# Patient Record
Sex: Female | Born: 1935 | Race: White | Hispanic: No | Marital: Single | Smoking: Never smoker
Health system: Southern US, Community
[De-identification: ages and names within clinical notes are randomized; demographics above are authoritative.]

## PROBLEM LIST (undated history)

## (undated) DIAGNOSIS — J45909 Unspecified asthma, uncomplicated: Secondary | ICD-10-CM

## (undated) DIAGNOSIS — K219 Gastro-esophageal reflux disease without esophagitis: Secondary | ICD-10-CM

## (undated) DIAGNOSIS — H409 Unspecified glaucoma: Secondary | ICD-10-CM

## (undated) DIAGNOSIS — I10 Essential (primary) hypertension: Secondary | ICD-10-CM

## (undated) HISTORY — PX: TOTAL ABDOMINAL HYSTERECTOMY W/ BILATERAL SALPINGOOPHORECTOMY: SHX83

## (undated) HISTORY — PX: HEMORRHOID SURGERY: SHX153

## (undated) HISTORY — PX: APPENDECTOMY: SHX54

---

## 2016-03-03 ENCOUNTER — Encounter (HOSPITAL_COMMUNITY): Payer: Self-pay | Admitting: Internal Medicine

## 2016-03-03 ENCOUNTER — Inpatient Hospital Stay (HOSPITAL_COMMUNITY)
Admission: AD | Admit: 2016-03-03 | Discharge: 2016-03-16 | DRG: 982 | Disposition: A | Discharge status: Skilled Nursing Facility | Payer: Medicare Other | Source: Other Acute Inpatient Hospital | Attending: Internal Medicine | Admitting: Internal Medicine

## 2016-03-03 DIAGNOSIS — IMO0002 Reserved for concepts with insufficient information to code with codable children: Secondary | ICD-10-CM

## 2016-03-03 DIAGNOSIS — Z79899 Other long term (current) drug therapy: Secondary | ICD-10-CM | POA: Diagnosis not present

## 2016-03-03 DIAGNOSIS — Z880 Allergy status to penicillin: Secondary | ICD-10-CM

## 2016-03-03 DIAGNOSIS — N390 Urinary tract infection, site not specified: Secondary | ICD-10-CM | POA: Diagnosis present

## 2016-03-03 DIAGNOSIS — I10 Essential (primary) hypertension: Secondary | ICD-10-CM | POA: Diagnosis present

## 2016-03-03 DIAGNOSIS — L8990 Pressure ulcer of unspecified site, unspecified stage: Secondary | ICD-10-CM | POA: Diagnosis present

## 2016-03-03 DIAGNOSIS — M549 Dorsalgia, unspecified: Secondary | ICD-10-CM | POA: Diagnosis present

## 2016-03-03 DIAGNOSIS — Z79891 Long term (current) use of opiate analgesic: Secondary | ICD-10-CM

## 2016-03-03 DIAGNOSIS — K5900 Constipation, unspecified: Secondary | ICD-10-CM | POA: Diagnosis not present

## 2016-03-03 DIAGNOSIS — M4856XA Collapsed vertebra, not elsewhere classified, lumbar region, initial encounter for fracture: Secondary | ICD-10-CM | POA: Diagnosis present

## 2016-03-03 DIAGNOSIS — K56609 Unspecified intestinal obstruction, unspecified as to partial versus complete obstruction: Secondary | ICD-10-CM

## 2016-03-03 DIAGNOSIS — Z885 Allergy status to narcotic agent status: Secondary | ICD-10-CM | POA: Diagnosis not present

## 2016-03-03 DIAGNOSIS — Z888 Allergy status to other drugs, medicaments and biological substances status: Secondary | ICD-10-CM | POA: Diagnosis not present

## 2016-03-03 DIAGNOSIS — E876 Hypokalemia: Secondary | ICD-10-CM | POA: Diagnosis not present

## 2016-03-03 DIAGNOSIS — M5136 Other intervertebral disc degeneration, lumbar region: Secondary | ICD-10-CM | POA: Diagnosis present

## 2016-03-03 DIAGNOSIS — Z881 Allergy status to other antibiotic agents status: Secondary | ICD-10-CM

## 2016-03-03 DIAGNOSIS — K5641 Fecal impaction: Secondary | ICD-10-CM | POA: Diagnosis present

## 2016-03-03 DIAGNOSIS — R000 Tachycardia, unspecified: Secondary | ICD-10-CM | POA: Diagnosis present

## 2016-03-03 DIAGNOSIS — J45909 Unspecified asthma, uncomplicated: Secondary | ICD-10-CM | POA: Diagnosis present

## 2016-03-03 DIAGNOSIS — H409 Unspecified glaucoma: Secondary | ICD-10-CM | POA: Diagnosis present

## 2016-03-03 DIAGNOSIS — D72829 Elevated white blood cell count, unspecified: Secondary | ICD-10-CM

## 2016-03-03 DIAGNOSIS — Z90710 Acquired absence of both cervix and uterus: Secondary | ICD-10-CM | POA: Diagnosis not present

## 2016-03-03 DIAGNOSIS — K219 Gastro-esophageal reflux disease without esophagitis: Secondary | ICD-10-CM | POA: Diagnosis present

## 2016-03-03 DIAGNOSIS — S32018A Other fracture of first lumbar vertebra, initial encounter for closed fracture: Secondary | ICD-10-CM | POA: Diagnosis not present

## 2016-03-03 HISTORY — DX: Gastro-esophageal reflux disease without esophagitis: K21.9

## 2016-03-03 HISTORY — DX: Unspecified glaucoma: H40.9

## 2016-03-03 HISTORY — DX: Essential (primary) hypertension: I10

## 2016-03-03 HISTORY — DX: Unspecified asthma, uncomplicated: J45.909

## 2016-03-03 MED ORDER — PANTOPRAZOLE SODIUM 40 MG PO TBEC
40.0000 mg | DELAYED_RELEASE_TABLET | Freq: Every day | ORAL | Status: DC
  Administered 2016-03-04 – 2016-03-05 (×2): 40 mg via ORAL
  Filled 2016-03-03 (×2): qty 1

## 2016-03-03 MED ORDER — ONDANSETRON HCL 4 MG/2ML IJ SOLN
4.0000 mg | Freq: Four times a day (QID) | INTRAMUSCULAR | Status: DC | PRN
  Administered 2016-03-03 – 2016-03-15 (×9): 4 mg via INTRAVENOUS
  Filled 2016-03-03 (×9): qty 2

## 2016-03-03 MED ORDER — LEVOFLOXACIN IN D5W 750 MG/150ML IV SOLN
750.0000 mg | INTRAVENOUS | Status: DC
  Administered 2016-03-04 – 2016-03-06 (×3): 750 mg via INTRAVENOUS
  Filled 2016-03-03 (×3): qty 150

## 2016-03-03 MED ORDER — AMLODIPINE BESYLATE 5 MG PO TABS
5.0000 mg | ORAL_TABLET | Freq: Every day | ORAL | Status: DC
  Administered 2016-03-04 – 2016-03-16 (×13): 5 mg via ORAL
  Filled 2016-03-03 (×13): qty 1

## 2016-03-03 MED ORDER — SODIUM CHLORIDE 0.9 % IV SOLN
INTRAVENOUS | Status: DC
  Administered 2016-03-03 – 2016-03-04 (×2): via INTRAVENOUS

## 2016-03-03 MED ORDER — ENOXAPARIN SODIUM 40 MG/0.4ML ~~LOC~~ SOLN
40.0000 mg | Freq: Every day | SUBCUTANEOUS | Status: DC
  Administered 2016-03-03 – 2016-03-11 (×8): 40 mg via SUBCUTANEOUS
  Filled 2016-03-03 (×9): qty 0.4

## 2016-03-03 NOTE — Progress Notes (Signed)
NURSING PROGRESS NOTE  Tara Frederick [id number] Admission Data: 03/03/2016 9:17 PM Attending Provider: Starleen Arms, MD PCP:No primary care provider on file. Code Status: Full  Tara Frederick is a 82 y.o. female patient admitted from ED:  -No acute distress noted.  -No complaints of shortness of breath.  -No complaints of chest pain.   Cardiac Monitoring: None   There were no vitals taken for this visit.   IV Fluids:  IV in place, occlusive dsg intact without redness, IV cath forearm left, condition patent and no redness normal saline.   Allergies:  Review of patient's allergies indicates not on file.  Past Medical History:   has no past medical history on file.  Past Surgical History:   has no past surgical history on file.  Social History:     Skin: redness on bottom. Pink foam in place.  Patient/Family orientated to room. Information packet given to patient/family. Admission inpatient armband information verified with patient/family to include name and date of birth and placed on patient arm. Side rails up x 2, fall assessment and education completed with patient/family. Patient/family able to verbalize understanding of risk associated with falls and verbalized understanding to call for assistance before getting out of bed. Call light within reach. Patient/family able to voice and demonstrate understanding of unit orientation instructions.    Patient arrived to unit at 2100.  Will continue to evaluate and treat per MD orders.

## 2016-03-03 NOTE — H&P (Signed)
History and Physical    Tara Frederick YVO:[id number] DOB: [birth year] DOA: 03/03/2016   PCP: No primary care provider on file. Marita Snellen MD Chief Complaint: Obstipation  HPI: Tara Frederick is a 82 y.o. female with medical history significant of HTN, GERD, degenerative disk disease.  Patient given narcotics in ED last week for back pain.  Since then she has had severe constipation.  Took narcotic last on Sunday.  No BM since then.  Presented to [facility] 3 days ago.  Was admitted.  CT scan at that time showed severe constipation.  Attempts made to treat medically include: 2L golytely, miralax BID, dulcolax 20mg  PO daily, senna, multiple fleet enemas, and 2 attempts at manual disimpaction (no stool in rectum).  All of the above attempts to treat medically have failed, and today patient finally developed nausea and ultimately numerous episodes of vomiting on ambulance ride down here.  Review of Systems: As per HPI otherwise 10 point review of systems negative.    Past Medical History:  Diagnosis Date  . Asthma   . GERD (gastroesophageal reflux disease)   . Glaucoma   . HTN (hypertension)     Past Surgical History:  Procedure Laterality Date  . APPENDECTOMY    . HEMORRHOID SURGERY     x2  . TOTAL ABDOMINAL HYSTERECTOMY W/ BILATERAL SALPINGOOPHORECTOMY       reports that she has never smoked. She has never used smokeless tobacco. She reports that she does not drink alcohol or use drugs.  Allergies not on file  Family History  Problem Relation Age of Onset  . Cancer Sister     Unknown type  . Stomach cancer Brother   . Leukemia Brother       Prior to Admission medications   Not on File    Physical Exam: Vitals:   03/03/16 2048  BP: 136/79  Pulse: (!) 116  Resp: 20  Temp: 97.8 F (36.6 C)  TempSrc: Oral  SpO2: 94%  Weight: 58.9 kg (129 lb 13.6 oz)      Constitutional: NAD, calm, comfortable Eyes: PERRL, lids and conjunctivae normal ENMT: Mucous membranes are  moist. Posterior pharynx clear of any exudate or lesions.Normal dentition.  Neck: normal, supple, no masses, no thyromegaly Respiratory: clear to auscultation bilaterally, no wheezing, no crackles. Normal respiratory effort. No accessory muscle use.  Cardiovascular: Regular rate and rhythm, no murmurs / rubs / gallops. No extremity edema. 2+ pedal pulses. No carotid bruits.  Abdomen: no tenderness, no masses palpated. No hepatosplenomegaly. Bowel sounds positive.  Musculoskeletal: no clubbing / cyanosis. No joint deformity upper and lower extremities. Good ROM, no contractures. Normal muscle tone.  Skin: no rashes, lesions, ulcers. No induration Neurologic: CN 2-12 grossly intact. Sensation intact, DTR normal. Strength 5/5 in all 4.  Psychiatric: Normal judgment and insight. Alert and oriented x 3. Normal mood.    Labs on Admission: I have personally reviewed following labs and imaging studies  CBC: No results for input(s): WBC, NEUTROABS, HGB, HCT, MCV, PLT in the last 168 hours. Basic Metabolic Panel: No results for input(s): NA, K, CL, CO2, GLUCOSE, BUN, CREATININE, CALCIUM, MG, PHOS in the last 168 hours. GFR: CrCl cannot be calculated (Unknown ideal weight.). Liver Function Tests: No results for input(s): AST, ALT, ALKPHOS, BILITOT, PROT, ALBUMIN in the last 168 hours. No results for input(s): LIPASE, AMYLASE in the last 168 hours. No results for input(s): AMMONIA in the last 168 hours. Coagulation Profile: No results for input(s): INR, PROTIME in the  last 168 hours. Cardiac Enzymes: No results for input(s): CKTOTAL, CKMB, CKMBINDEX, TROPONINI in the last 168 hours. BNP (last 3 results) No results for input(s): PROBNP in the last 8760 hours. HbA1C: No results for input(s): HGBA1C in the last 72 hours. CBG: No results for input(s): GLUCAP in the last 168 hours. Lipid Profile: No results for input(s): CHOL, HDL, LDLCALC, TRIG, CHOLHDL, LDLDIRECT in the last 72 hours. Thyroid  Function Tests: No results for input(s): TSH, T4TOTAL, FREET4, T3FREE, THYROIDAB in the last 72 hours. Anemia Panel: No results for input(s): VITAMINB12, FOLATE, FERRITIN, TIBC, IRON, RETICCTPCT in the last 72 hours. Urine analysis: No results found for: COLORURINE, APPEARANCEUR, LABSPEC, PHURINE, GLUCOSEU, HGBUR, BILIRUBINUR, KETONESUR, PROTEINUR, UROBILINOGEN, NITRITE, LEUKOCYTESUR Sepsis Labs: (procalcitonin:4,lacticidven:4) )No results found for this or any previous visit (from the past 240 hour(s)).   Radiological Exams on Admission: No results found.  EKG: Independently reviewed.  Assessment/Plan Principal Problem:   Obstipation Active Problems:   UTI (lower urinary tract infection)    Obstipation - now with N/V  Dr. Bosie Clos was called prior to transfer, GI to see patient tomorrow, probably needs decompressive colonoscopy it sounds like.  Getting gastrocult  zofran PRN  Holding off on further attempts at laxative treatment since this has done nothing thus far and now progressing to N/V  KUB ordered  If N/V continues then will need NGT  UTI - continue levaquin started at [place]   DVT prophylaxis: Lovenox Code Status: Full Family Communication: Family at bedside Consults called: See above Admission status: Admit to inpatient   Hillary Bow DO [facility] Pager [phone] from 7PM-7AM  If 7AM-7PM, please contact the day physician for the patient [email or web address] Password [facility]  03/03/2016, 10:15 PM

## 2016-03-04 ENCOUNTER — Encounter (HOSPITAL_COMMUNITY): Payer: Self-pay | Admitting: Internal Medicine

## 2016-03-04 ENCOUNTER — Inpatient Hospital Stay (HOSPITAL_COMMUNITY): Payer: Medicare Other

## 2016-03-04 DIAGNOSIS — I10 Essential (primary) hypertension: Secondary | ICD-10-CM

## 2016-03-04 DIAGNOSIS — D72829 Elevated white blood cell count, unspecified: Secondary | ICD-10-CM

## 2016-03-04 DIAGNOSIS — K219 Gastro-esophageal reflux disease without esophagitis: Secondary | ICD-10-CM | POA: Diagnosis present

## 2016-03-04 HISTORY — DX: Essential (primary) hypertension: I10

## 2016-03-04 LAB — URINALYSIS, ROUTINE W REFLEX MICROSCOPIC
GLUCOSE, UA: NEGATIVE mg/dL
KETONES UR: 15 mg/dL — AB
NITRITE: NEGATIVE
PROTEIN: 30 mg/dL — AB
Specific Gravity, Urine: 1.022 (ref 1.005–1.030)
pH: 6 (ref 5.0–8.0)

## 2016-03-04 LAB — CBC
HCT: 36.1 % (ref 36.0–46.0)
Hemoglobin: 11.9 g/dL — ABNORMAL LOW (ref 12.0–15.0)
MCH: 28.9 pg (ref 26.0–34.0)
MCHC: 33 g/dL (ref 30.0–36.0)
MCV: 87.6 fL (ref 78.0–100.0)
PLATELETS: 381 10*3/uL (ref 150–400)
RBC: 4.12 MIL/uL (ref 3.87–5.11)
RDW: 12.8 % (ref 11.5–15.5)
WBC: 15.4 10*3/uL — AB (ref 4.0–10.5)

## 2016-03-04 LAB — OCCULT BLOOD GASTRIC / DUODENUM (SPECIMEN CUP): OCCULT BLOOD, GASTRIC: POSITIVE — AB

## 2016-03-04 LAB — BASIC METABOLIC PANEL
ANION GAP: 14 (ref 5–15)
BUN: 22 mg/dL — ABNORMAL HIGH (ref 6–20)
CALCIUM: 10.6 mg/dL — AB (ref 8.9–10.3)
CO2: 22 mmol/L (ref 22–32)
Chloride: 96 mmol/L — ABNORMAL LOW (ref 101–111)
Creatinine, Ser: 0.97 mg/dL (ref 0.44–1.00)
GFR, EST AFRICAN AMERICAN: 58 mL/min — AB (ref >60)
GFR, EST NON AFRICAN AMERICAN: 50 mL/min — AB (ref >60)
GLUCOSE: 104 mg/dL — AB (ref 65–99)
Potassium: 3.3 mmol/L — ABNORMAL LOW (ref 3.5–5.1)
Sodium: 132 mmol/L — ABNORMAL LOW (ref 135–145)

## 2016-03-04 LAB — URINE MICROSCOPIC-ADD ON

## 2016-03-04 LAB — MAGNESIUM: Magnesium: 1.5 mg/dL — ABNORMAL LOW (ref 1.7–2.4)

## 2016-03-04 MED ORDER — LATANOPROST 0.005 % OP SOLN
1.0000 [drp] | Freq: Every day | OPHTHALMIC | Status: DC
  Administered 2016-03-08 – 2016-03-15 (×8): 1 [drp] via OPHTHALMIC
  Filled 2016-03-04: qty 2.5

## 2016-03-04 MED ORDER — SODIUM CHLORIDE 0.9 % IV SOLN
INTRAVENOUS | Status: DC
  Administered 2016-03-04 – 2016-03-07 (×5): via INTRAVENOUS
  Filled 2016-03-04 (×10): qty 1000

## 2016-03-04 MED ORDER — POTASSIUM CHLORIDE 20 MEQ PO PACK
40.0000 meq | PACK | Freq: Once | ORAL | Status: AC
Start: 2016-03-04 — End: 2016-03-04
  Administered 2016-03-04: 40 meq via ORAL
  Filled 2016-03-04: qty 2

## 2016-03-04 MED ORDER — POTASSIUM CHLORIDE 10 MEQ/100ML IV SOLN
INTRAVENOUS | Status: AC
  Filled 2016-03-04: qty 100

## 2016-03-04 MED ORDER — KETOROLAC TROMETHAMINE 15 MG/ML IJ SOLN
15.0000 mg | Freq: Four times a day (QID) | INTRAMUSCULAR | Status: DC | PRN
  Administered 2016-03-05: 15 mg via INTRAVENOUS
  Filled 2016-03-04 (×2): qty 1

## 2016-03-04 MED ORDER — IOPAMIDOL (ISOVUE-300) INJECTION 61%
INTRAVENOUS | Status: AC
  Administered 2016-03-04: 80 mL
  Filled 2016-03-04: qty 100

## 2016-03-04 MED ORDER — SORBITOL 70 % SOLN
960.0000 mL | TOPICAL_OIL | Freq: Once | ORAL | Status: DC
  Filled 2016-03-04: qty 240

## 2016-03-04 MED ORDER — MAGNESIUM SULFATE 4 GM/100ML IV SOLN
4.0000 g | Freq: Once | INTRAVENOUS | Status: AC
  Administered 2016-03-04: 4 g via INTRAVENOUS
  Filled 2016-03-04: qty 100

## 2016-03-04 MED ORDER — PROCHLORPERAZINE EDISYLATE 5 MG/ML IJ SOLN
10.0000 mg | Freq: Four times a day (QID) | INTRAMUSCULAR | Status: DC | PRN
  Administered 2016-03-04: 10 mg via INTRAVENOUS
  Filled 2016-03-04: qty 2

## 2016-03-04 MED ORDER — DIATRIZOATE MEGLUMINE & SODIUM 66-10 % PO SOLN
ORAL | Status: AC
  Filled 2016-03-04: qty 30

## 2016-03-04 MED ORDER — POTASSIUM CHLORIDE 10 MEQ/100ML IV SOLN
10.0000 meq | INTRAVENOUS | Status: AC
  Administered 2016-03-04 (×4): 10 meq via INTRAVENOUS
  Filled 2016-03-04 (×3): qty 100

## 2016-03-04 MED ORDER — POTASSIUM CHLORIDE CRYS ER 20 MEQ PO TBCR: 40.0000 meq | EXTENDED_RELEASE_TABLET | Freq: Once | ORAL | Status: DC

## 2016-03-04 NOTE — Consult Note (Signed)
[facility] Consult Note  Referring Provider: No ref. provider found Primary Care Physician:  No primary care provider on file. Primary Gastroenterologist:  Dr.  Laurel Dimmer Complaint: Obstipation nausea and vomiting HPI: Tara Frederick is an 82 y.o. quite female  who has had no spontaneous bowel movement in 2 weeks after receiving pain medicine for back injury. She became increasingly distended and was admitted to the hospital Monday for attempted clearance of presumed impaction. She was treated with Mira lax, Dulcolax multiple Fleet's enemas and attempted manual disimpaction with no stool in the vault. She began having nausea and vomiting yesterday and was transferred here. We have no x-rays on her but they're ordered. She does have moderate leukocytosis.   Past Medical History:  Diagnosis Date  . Asthma   . GERD (gastroesophageal reflux disease)   . Glaucoma   . HTN (hypertension)     Past Surgical History:  Procedure Laterality Date  . APPENDECTOMY    . HEMORRHOID SURGERY     x2  . TOTAL ABDOMINAL HYSTERECTOMY W/ BILATERAL SALPINGOOPHORECTOMY      Medications Prior to Admission  Medication Sig Dispense Refill  . albuterol (PROVENTIL HFA;VENTOLIN HFA) 108 (90 Base) MCG/ACT inhaler Inhale 1 puff into the lungs every 6 (six) hours as needed for wheezing or shortness of breath.    Marland Kitchen amLODipine (NORVASC) 5 MG tablet Take 5 mg by mouth daily.    . carboxymethylcellulose (REFRESH PLUS) 0.5 % SOLN Place 1 drop into both eyes 3 (three) times daily as needed.    Marland Kitchen denosumab (PROLIA) 60 MG/ML SOLN injection Inject 60 mg into the skin every 6 (six) months. Administer in upper arm, thigh, or abdomen    . ergocalciferol (VITAMIN D2) 50000 units capsule Take 50,000 Units by mouth every 30 (thirty) days.    Marland Kitchen erythromycin ophthalmic ointment Place 1 application into both eyes at bedtime.    . fexofenadine (ALLEGRA) 180 MG tablet Take 180 mg by mouth daily.    Marland Kitchen latanoprost (XALATAN)  0.005 % ophthalmic solution Place 1 drop into the left eye at bedtime.    . Multiple Vitamins-Minerals (MULTIVITAMIN WITH MINERALS) tablet Take 1 tablet by mouth daily.    . pantoprazole (PROTONIX) 40 MG tablet Take 40 mg by mouth 2 (two) times daily.      Allergies:  Allergies  Allergen Reactions  . Toprol Xl [Metoprolol] Shortness Of Breath  . Actonel [Risedronate Sodium]     unk  . Biaxin [Clarithromycin] Nausea And Vomiting  . Ceftin [Cefuroxime]     unk  . Codeine Nausea Only  . Diuretic [Buchu-Cornsilk-Ch Grass-Hydran]     unknown  . Evista [Raloxifene]     unknown  . Fosamax [Alendronate Sodium]     uknown  . Lortab [Hydrocodone-Acetaminophen]     "activie"  . Macrobid [Nitrofurantoin]     unknown  . Morphine And Related Nausea Only  . Neosporin [Neomycin-Bacitracin Zn-Polymyx]     unk  . Penicillins     Unknown  . Prednisone Nausea And Vomiting  . Tetracyclines & Related     unknown  . Tramadol Nausea Only    Family History  Problem Relation Age of Onset  . Cancer Sister     Unknown type  . Stomach cancer Brother   . Leukemia Brother     Social History:  reports that she has never smoked. She has never used smokeless tobacco. She reports that she does not drink alcohol or use drugs.  Review of Systems: negative  except As above   Blood pressure 139/69, pulse (!) 116, temperature 98.7 F (37.1 C), temperature source Oral, resp. rate 17, weight 58.9 kg (129 lb 13.6 oz), SpO2 94 %. Head: Normocephalic, without obvious abnormality, atraumatic Neck: no adenopathy, no carotid bruit, no JVD, supple, symmetrical, trachea midline and thyroid not enlarged, symmetric, no tenderness/mass/nodules Resp: clear to auscultation bilaterally Cardio: regular rate and rhythm, S1, S2 normal, no murmur, click, rub or gallop GI: Moderately distended tympanitic, patient appears moderately uncomfortable. No obvious organomegaly or mass Extremities: extremities normal, atraumatic,  no cyanosis or edema  Results for orders placed or performed during the hospital encounter of 03/03/16 (from the past 48 hour(s))  Urinalysis, Routine w reflex microscopic (not at [facility]     Status: Abnormal   Collection Time: 03/04/16 12:50 AM  Result Value Ref Range   Color, Urine AMBER (A) YELLOW    Comment: BIOCHEMICALS MAY BE AFFECTED BY COLOR   APPearance CLOUDY (A) CLEAR   Specific Gravity, Urine 1.022 1.005 - 1.030   pH 6.0 5.0 - 8.0   Glucose, UA NEGATIVE NEGATIVE mg/dL   Hgb urine dipstick SMALL (A) NEGATIVE   Bilirubin Urine SMALL (A) NEGATIVE   Ketones, ur 15 (A) NEGATIVE mg/dL   Protein, ur 30 (A) NEGATIVE mg/dL   Nitrite NEGATIVE NEGATIVE   Leukocytes, UA MODERATE (A) NEGATIVE  Urine microscopic-add on     Status: Abnormal   Collection Time: 03/04/16 12:50 AM  Result Value Ref Range   Squamous Epithelial / LPF 0-5 (A) NONE SEEN   WBC, UA TOO NUMEROUS TO COUNT 0 - 5 WBC/hpf   RBC / HPF 0-5 0 - 5 RBC/hpf   Bacteria, UA MANY (A) NONE SEEN   Crystals CA OXALATE CRYSTALS (A) NEGATIVE  Occult blood gastric / duodenum     Status: Abnormal   Collection Time: 03/04/16  2:05 AM  Result Value Ref Range   pH, Gastric NOT DONE    Occult Blood, Gastric POSITIVE (A) NEGATIVE  CBC     Status: Abnormal   Collection Time: 03/04/16  5:02 AM  Result Value Ref Range   WBC 15.4 (H) 4.0 - 10.5 K/uL   RBC 4.12 3.87 - 5.11 MIL/uL   Hemoglobin 11.9 (L) 12.0 - 15.0 g/dL   HCT 36.1 36.0 - 46.0 %   MCV 87.6 78.0 - 100.0 fL   MCH 28.9 26.0 - 34.0 pg   MCHC 33.0 30.0 - 36.0 g/dL   RDW 12.8 11.5 - 15.5 %   Platelets 381 150 - 400 K/uL  Basic metabolic panel     Status: Abnormal   Collection Time: 03/04/16  5:02 AM  Result Value Ref Range   Sodium 132 (L) 135 - 145 mmol/L   Potassium 3.3 (L) 3.5 - 5.1 mmol/L   Chloride 96 (L) 101 - 111 mmol/L   CO2 22 22 - 32 mmol/L   Glucose, Bld 104 (H) 65 - 99 mg/dL   BUN 22 (H) 6 - 20 mg/dL   Creatinine, Ser 0.97 0.44 - 1.00 mg/dL   Calcium 10.6  (H) 8.9 - 10.3 mg/dL   GFR calc non Af Amer 50 (L) >60 mL/min   GFR calc Af Amer 58 (L) >60 mL/min    Comment: (NOTE) The eGFR has been calculated using the CKD EPI equation. This calculation has not been validated in all clinical situations. eGFR's persistently <60 mL/min signify possible Chronic Kidney Disease.    Anion gap 14 5 - 15  Magnesium  Status: Abnormal   Collection Time: 03/04/16  5:02 AM  Result Value Ref Range   Magnesium 1.5 (L) 1.7 - 2.4 mg/dL   No results found.  Assessment: Constipation/impaction, exact level unclear. Plan:  Await x-rays. May need NG decompression, possible continued Mira lax by NG tube, possible disimpaction under anesthesia if impaction low enough,. We'll await x-rays and follow with you. Maecie Sevcik C 03/04/2016, 9:59 AM  Pager [phone] If no answer or after 5 PM call [phone]

## 2016-03-04 NOTE — Progress Notes (Signed)
Charge RN and ICU RN attempted x1 to place NG tube and was unsuccessful.

## 2016-03-04 NOTE — Progress Notes (Signed)
PROGRESS NOTE    Tara Frederick  YSA:[id number] DOB: [birth year] DOA: 03/03/2016 PCP: No primary care provider on file.    Brief Narrative:  Tara Frederick is a 82 y.o. female with medical history significant of HTN, GERD, degenerative disk disease.  Patient given narcotics in ED last week for back pain.  Since then she has had severe constipation.  Took narcotic last on "Sunday.  No BM since then.  Presented to [facility] 3 days ago.  Was admitted.  CT scan at that time showed severe constipation.  Attempts made to treat medically include: 2L golytely, miralax BID, dulcolax 20mg PO daily, senna, multiple fleet enemas, and 2 attempts at manual disimpaction (no stool in rectum).  All of the above attempts to treat medically have failed, and today patient finally developed nausea and ultimately numerous episodes of vomiting on ambulance ride down here.  Assessment & Plan:   Principal Problem:   Obstipation Active Problems:   UTI (lower urinary tract infection)   Benign essential HTN   GERD (gastroesophageal reflux disease)  #1 obstipation Patient admitted with concerns for obstipation. Patient's last bowel movement was July 12 of family after receiving narcotic pain medications for back injury. Patient with worsening abdominal distention and inability to have bowel movements the site multiple laxatives and multiple Fleet enemas as well as an attempt at manual disimpaction with no stool noted in the rectal vault. Patient with some nausea today no emesis. Plain films of the abdomen with a markedly dilated loop of colon in the right side of the abdomen measuring approximately 11.7 cm. Increasing risk for perforation. Concern for possible sigmoid volvulus. Will get a CT scan of the abdomen and pelvis for further evaluation. Continue bowel rest. IV fluids. Supportive care. GI has been consulted and following.  #2 probable UTI Check urine cultures. IV Levaquin.  #3 leukocytosis Likely secondary to problem  #2.  #4 gastroesophageal reflux disease PPI.  #5 hypertension Continue Norvasc.   DVT prophylaxis: Lovenox Code Status: Full Family Communication: Updated patient, and daughters and brother at bedside. Disposition Plan: Home once acute medical issues have resolved and patient is stable and tolerating oral intake with good bowel movements.   Consultants:   Gastroenterology: Dr. Hayes 03/04/2016  Procedures:   CXR 03/04/2016  KUB 03/04/2016  Antimicrobials:   IV Levaquin 03/04/2016   Subjective: Patient c/o nausea, abdominal pain. Last BM 02/16/2016. No SOB. No CP.  Objective: Vitals:   03/03/16 2048 03/04/16 0513  BP: 136/79 139/69  Pulse: (!) 116 (!) 116  Resp: 20 17  Temp: 97.8 F (36.6 C) 98.7 F (37.1 C)  TempSrc: Oral Oral  SpO2: 94% 94%  Weight: 58.9 kg (129 lb 13.6 oz)     Intake/Output Summary (Last 24 hours) at 03/04/16 1252 Last data filed at 03/04/16 0949  Gross per 24 hour  Intake           993.75 ml  Output              400 ml  Net           59" 3.75 ml   Filed Weights   03/03/16 2048  Weight: 58.9 kg (129 lb 13.6 oz)    Examination:  General exam: Appears calm and comfortable  Respiratory system: Clear to auscultation anterior lung fields. Cardiovascular system: S1 & S2 heard, RRR. No JVD, murmurs, rubs, gallops or clicks. No pedal edema. Gastrointestinal system: Abdomen is distended, diffusely TTP greater RLQ. Mildly tight. No organomegaly or masses felt.  Normal bowel sounds heard. Central nervous system: Alert and oriented. No focal neurological deficits. Extremities: Symmetric 5 x 5 power. Skin: No rashes, lesions or ulcers Psychiatry: Judgement and insight appear normal. Mood & affect appropriate.     Data Reviewed: I have personally reviewed following labs and imaging studies  CBC:  Recent Labs Lab 03/04/16 0502  WBC 15.4*  HGB 11.9*  HCT 36.1  MCV 87.6  PLT 381   Basic Metabolic Panel:  Recent Labs Lab 03/04/16 0502   NA 132*  K 3.3*  CL 96*  CO2 22  GLUCOSE 104*  BUN 22*  CREATININE 0.97  CALCIUM 10.6*  MG 1.5*   GFR: CrCl cannot be calculated (Unknown ideal weight.). Liver Function Tests: No results for input(s): AST, ALT, ALKPHOS, BILITOT, PROT, ALBUMIN in the last 168 hours. No results for input(s): LIPASE, AMYLASE in the last 168 hours. No results for input(s): AMMONIA in the last 168 hours. Coagulation Profile: No results for input(s): INR, PROTIME in the last 168 hours. Cardiac Enzymes: No results for input(s): CKTOTAL, CKMB, CKMBINDEX, TROPONINI in the last 168 hours. BNP (last 3 results) No results for input(s): PROBNP in the last 8760 hours. HbA1C: No results for input(s): HGBA1C in the last 72 hours. CBG: No results for input(s): GLUCAP in the last 168 hours. Lipid Profile: No results for input(s): CHOL, HDL, LDLCALC, TRIG, CHOLHDL, LDLDIRECT in the last 72 hours. Thyroid Function Tests: No results for input(s): TSH, T4TOTAL, FREET4, T3FREE, THYROIDAB in the last 72 hours. Anemia Panel: No results for input(s): VITAMINB12, FOLATE, FERRITIN, TIBC, IRON, RETICCTPCT in the last 72 hours. Sepsis Labs: No results for input(s): PROCALCITON, LATICACIDVEN in the last 168 hours.  No results found for this or any previous visit (from the past 240 hour(s)).       Radiology Studies: Dg Chest 2 View  Result Date: 03/04/2016 CLINICAL DATA:  Leukocytosis. EXAM: CHEST  2 VIEW COMPARISON:  None. FINDINGS: Small bilateral effusions. The heart, hila, an mediastinum are unremarkable. No pulmonary nodules or masses. No focal infiltrates. Mild pleural thickening laterally in the right chest, likely chronic. No other acute abnormalities. IMPRESSION: Small effusions and underlying atelectasis. Electronically Signed   By: Gerome Sam III M.D   On: 03/04/2016 11:29  Dg Abd 2 Views  Result Date: 03/04/2016 CLINICAL DATA:  Leukocytosis and constipation EXAM: ABDOMEN - 2 VIEW COMPARISON:  None.  FINDINGS: No free air, portal venous gas, or pneumatosis. Small effusions in the lung bases. Markedly dilated colon primarily in the right lower abdomen with air-fluid levels on the upright view. The most dilated loop measures up to 11.7 cm increasing the risk of perforation. The more distal loops of colon are relatively decompressed but air does extend into the rectum. No pneumatosis. No other acute abnormalities. IMPRESSION: There is a markedly dilated loop of colon in the right side of the abdomen. A sigmoid volvulus is not excluded. Recommend a CT scan for further evaluation. Findings being called to Dr. Maisie Fus. Electronically Signed   By: Gerome Sam III M.D   On: 03/04/2016 11:39       Scheduled Meds: . amLODipine  5 mg Oral Daily  . enoxaparin (LOVENOX) injection  40 mg Subcutaneous QHS  . levofloxacin (LEVAQUIN) IV  750 mg Intravenous Q24H  . magnesium sulfate 1 - 4 g bolus IVPB  4 g Intravenous Once  . pantoprazole  40 mg Oral Daily   Continuous Infusions: . sodium chloride 100 mL/hr at 03/04/16 1610  LOS: 1 day    Time spent: 35 mins    Mekaela Azizi, MD [facility] Pager [phone]  If 7PM-7AM, please contact night-coverage [email or web address] Password [facility] 03/04/2016, 12:52 PM

## 2016-03-04 NOTE — Progress Notes (Signed)
Upon pt assessment RN noticed Ng tubing was wrapped around in pt mouth. Charge RN called and assisted RN with attempting to place another one. Attempt was unsuccessful. Called Rapid response to see if able to attempt to place another. Schorr, NP paged and notified. Will continue to monitor pt.

## 2016-03-04 NOTE — Progress Notes (Signed)
On call Schorr, NP called back and stated ok to leave NG tube out for tonight d/t multiple unsuccessful attempts will inform day shift MD, and to monitor pt for any distress, nausea, and vomiting. If so will call back.

## 2016-03-04 NOTE — Progress Notes (Addendum)
Pt given 2L tap water enema. Will monitor for bowel movement and will repeat in 4 hours per order.

## 2016-03-04 NOTE — Consult Note (Signed)
Reason for Consult: severe constipation Referring Physician: Dr Alycia Rossetti  Tara Frederick is an 82 y.o. female.  HPI:  82 y.o. female with medical history significant of HTN, GERD, degenerative disk disease.  Patient given narcotics in ED last week for back pain.  Since then she has had severe constipation.  Took narcotic last on Sunday.  No BM since then.  Presented to [facility] 4 days ago.  Was admitted.  CT scan at that time showed severe constipation.  Attempts made to treat medically include: 2L golytely, miralax BID, dulcolax 74m PO daily, senna, multiple fleet enemas, and 2 attempts at manual disimpaction (no stool in rectum).  All of the above attempts to treat medically have failed, and yesterday patient finally developed nausea and ultimately numerous episodes of vomiting on ambulance ride down here.  Plain films here demonstrate distended cecum. CT scan demonstrates proximal colon dilated and full of stool. There is a question transition in the mid descending colon.  She reportedly has a history of diverticulitis.  Past Medical History:  Diagnosis Date  . Asthma   . Benign essential HTN 03/04/2016  . GERD (gastroesophageal reflux disease)   . Glaucoma   . HTN (hypertension)     Past Surgical History:  Procedure Laterality Date  . APPENDECTOMY    . HEMORRHOID SURGERY     x2  . TOTAL ABDOMINAL HYSTERECTOMY W/ BILATERAL SALPINGOOPHORECTOMY      Family History  Problem Relation Age of Onset  . Cancer Sister     Unknown type  . Stomach cancer Brother   . Leukemia Brother     Social History:  reports that she has never smoked. She has never used smokeless tobacco. She reports that she does not drink alcohol or use drugs.  Allergies:  Allergies  Allergen Reactions  . Toprol Xl [Metoprolol] Shortness Of Breath  . Actonel [Risedronate Sodium]     unk  . Biaxin [Clarithromycin] Nausea And Vomiting  . Ceftin [Cefuroxime]     unk  . Codeine Nausea Only  . Diuretic  [Buchu-Cornsilk-Ch Grass-Hydran]     unknown  . Evista [Raloxifene]     unknown  . Fosamax [Alendronate Sodium]     uknown  . Lortab [Hydrocodone-Acetaminophen]     "activie"  . Macrobid [Nitrofurantoin]     unknown  . Morphine And Related Nausea Only  . Neosporin [Neomycin-Bacitracin Zn-Polymyx]     unk  . Penicillins     Unknown  . Prednisone Nausea And Vomiting  . Tetracyclines & Related     unknown  . Tramadol Nausea Only    Medications: I have reviewed the patient's current medications.  Results for orders placed or performed during the hospital encounter of 03/03/16 (from the past 48 hour(s))  Urinalysis, Routine w reflex microscopic ([facility]     Status: Abnormal   Collection Time: 03/04/16 12:50 AM  Result Value Ref Range   Color, Urine AMBER (A) YELLOW    Comment: BIOCHEMICALS MAY BE AFFECTED BY COLOR   APPearance CLOUDY (A) CLEAR   Specific Gravity, Urine 1.022 1.005 - 1.030   pH 6.0 5.0 - 8.0   Glucose, UA NEGATIVE NEGATIVE mg/dL   Hgb urine dipstick SMALL (A) NEGATIVE   Bilirubin Urine SMALL (A) NEGATIVE   Ketones, ur 15 (A) NEGATIVE mg/dL   Protein, ur 30 (A) NEGATIVE mg/dL   Nitrite NEGATIVE NEGATIVE   Leukocytes, UA MODERATE (A) NEGATIVE  Urine microscopic-add on     Status: Abnormal  Collection Time: 03/04/16 12:50 AM  Result Value Ref Range   Squamous Epithelial / LPF 0-5 (A) NONE SEEN   WBC, UA TOO NUMEROUS TO COUNT 0 - 5 WBC/hpf   RBC / HPF 0-5 0 - 5 RBC/hpf   Bacteria, UA MANY (A) NONE SEEN   Crystals CA OXALATE CRYSTALS (A) NEGATIVE  Occult blood gastric / duodenum     Status: Abnormal   Collection Time: 03/04/16  2:05 AM  Result Value Ref Range   pH, Gastric NOT DONE    Occult Blood, Gastric POSITIVE (A) NEGATIVE  CBC     Status: Abnormal   Collection Time: 03/04/16  5:02 AM  Result Value Ref Range   WBC 15.4 (H) 4.0 - 10.5 K/uL   RBC 4.12 3.87 - 5.11 MIL/uL   Hemoglobin 11.9 (L) 12.0 - 15.0 g/dL   HCT 36.1 36.0 - 46.0 %   MCV 87.6  78.0 - 100.0 fL   MCH 28.9 26.0 - 34.0 pg   MCHC 33.0 30.0 - 36.0 g/dL   RDW 12.8 11.5 - 15.5 %   Platelets 381 150 - 400 K/uL  Basic metabolic panel     Status: Abnormal   Collection Time: 03/04/16  5:02 AM  Result Value Ref Range   Sodium 132 (L) 135 - 145 mmol/L   Potassium 3.3 (L) 3.5 - 5.1 mmol/L   Chloride 96 (L) 101 - 111 mmol/L   CO2 22 22 - 32 mmol/L   Glucose, Bld 104 (H) 65 - 99 mg/dL   BUN 22 (H) 6 - 20 mg/dL   Creatinine, Ser 0.97 0.44 - 1.00 mg/dL   Calcium 10.6 (H) 8.9 - 10.3 mg/dL   GFR calc non Af Amer 50 (L) >60 mL/min   GFR calc Af Amer 58 (L) >60 mL/min    Comment: (NOTE) The eGFR has been calculated using the CKD EPI equation. This calculation has not been validated in all clinical situations. eGFR's persistently <60 mL/min signify possible Chronic Kidney Disease.    Anion gap 14 5 - 15  Magnesium     Status: Abnormal   Collection Time: 03/04/16  5:02 AM  Result Value Ref Range   Magnesium 1.5 (L) 1.7 - 2.4 mg/dL    Dg Chest 2 View  Result Date: 03/04/2016 CLINICAL DATA:  Leukocytosis. EXAM: CHEST  2 VIEW COMPARISON:  None. FINDINGS: Small bilateral effusions. The heart, hila, an mediastinum are unremarkable. No pulmonary nodules or masses. No focal infiltrates. Mild pleural thickening laterally in the right chest, likely chronic. No other acute abnormalities. IMPRESSION: Small effusions and underlying atelectasis. Electronically Signed   By: Dorise Bullion III M.D   On: 03/04/2016 11:29  Ct Abdomen Pelvis W Contrast  Result Date: 03/04/2016 CLINICAL DATA:  82 year old female with abdominal pain and distention and severe constipation. EXAM: CT ABDOMEN AND PELVIS WITH CONTRAST TECHNIQUE: Multidetector CT imaging of the abdomen and pelvis was performed using the standard protocol following bolus administration of intravenous contrast. CONTRAST:  11m ISOVUE-300 IOPAMIDOL (ISOVUE-300) INJECTION 61% COMPARISON:  03/04/2016 FINDINGS: Lower chest: Mild  cardiomegaly noted. A moderate hiatal hernia is noted. There is filling defect within this hernia which could represent food but difficult to exclude a mass. Very small bilateral pleural effusions and mild bibasilar atelectasis noted. Hepatobiliary: The liver and gallbladder are unremarkable. There is no evidence of biliary dilatation. Pancreas: Atrophic without other significant abnormality. Spleen: Unremarkable Adrenals/Urinary Tract: Bilateral renal atrophy identified. There is no evidence of hydronephrosis. The adrenal glands and  bladder are unremarkable. Stomach/Bowel: A very large amount of stool is noted within the cecum, ascending colon, transverse colon and proximal descending colon. Gas and fluid within the cecum is also identified which measures up to 9.5 cm in greatest diameter. There is a relative transition point in the mid descending colon without definite colonic abnormality. No wall thickening is identified. There is no evidence of small bowel dilatation. Vascular/Lymphatic: Aortic atherosclerotic calcifications noted without aneurysm. No enlarged lymph nodes are identified. Reproductive: The patient is status post hysterectomy. No adnexal masses identified. Other: No ascites, pneumoperitoneum or abscess. Musculoskeletal: Degenerative changes within the lumbar spine identified. 25% superior endplate compression fracture of L1 is age indeterminate. Mild bony retropulsion noted. IMPRESSION: Distended cecum, ascending colon, transverse colon and proximal descending colon filled with large amount of stool. Relative transition point in the mid descending colon without definite colonic abnormality. Consider direct inspection. No evidence of bowel wall thickening, small bowel obstruction or pneumoperitoneum. Moderate hiatal hernia with filling defect, which could represent feet but a mass is not entirely excluded. Consider direct inspection as indicated. 25% superior endplate compression fracture of L1-  indeterminate. Very small bilateral pleural effusions with bibasilar atelectasis. Cardiomegaly. Electronically Signed   By: Margarette Canada M.D.   On: 03/04/2016 16:36  Dg Abd 2 Views  Result Date: 03/04/2016 CLINICAL DATA:  Leukocytosis and constipation EXAM: ABDOMEN - 2 VIEW COMPARISON:  None. FINDINGS: No free air, portal venous gas, or pneumatosis. Small effusions in the lung bases. Markedly dilated colon primarily in the right lower abdomen with air-fluid levels on the upright view. The most dilated loop measures up to 11.7 cm increasing the risk of perforation. The more distal loops of colon are relatively decompressed but air does extend into the rectum. No pneumatosis. No other acute abnormalities. IMPRESSION: There is a markedly dilated loop of colon in the right side of the abdomen. A sigmoid volvulus is not excluded. Recommend a CT scan for further evaluation. Findings being called to Dr. Marcello Moores. Electronically Signed   By: Dorise Bullion III M.D   On: 03/04/2016 11:39   Review of Systems  Constitutional: Positive for malaise/fatigue.  Gastrointestinal: Positive for abdominal pain, nausea and vomiting.  Musculoskeletal: Positive for back pain.  All other systems reviewed and are negative.  Blood pressure (!) 143/60, pulse (!) 107, temperature 98.2 F (36.8 C), temperature source Oral, resp. rate 20, weight 58.9 kg (129 lb 13.6 oz), SpO2 96 %. Physical Exam  Vitals reviewed. Constitutional: She is oriented to person, place, and time. She appears well-developed and well-nourished. No distress.  Elderly female. Nontoxic, resting comfortably  HENT:  Head: Normocephalic and atraumatic.  Right Ear: External ear normal.  Left Ear: External ear normal.  Eyes: Conjunctivae are normal. No scleral icterus.  Neck: Normal range of motion. Neck supple. No tracheal deviation present.  Cardiovascular: Normal rate and normal heart sounds.   Respiratory: Effort normal and breath sounds normal. No  stridor. No respiratory distress. She has no wheezes.  GI: Soft. She exhibits distension. There is tenderness in the right lower quadrant. There is no rebound and no guarding.  Some abd distension, mainly TTP in RLQ. Hypobs. Old scars. No rebound/guarding/peritonitis.   Musculoskeletal: She exhibits no edema or tenderness.  Neurological: She is alert and oriented to person, place, and time. She exhibits normal muscle tone.  Skin: Skin is warm and dry. No rash noted. She is not diaphoretic. No erythema. No pallor.  Psychiatric: She has a normal mood and affect.  Her behavior is normal. Judgment and thought content normal.    Assessment/Plan: Obstipation/constipation UTI HTN  There is no sign of bowel compromise on her CT. There is no portal venous gas, pneumatosis, or bowel wall thickening. I would repeat her labs in the morning.  To me there appears to be a possible area transition in the descending colon. I agree that the patient needs endoscopic evaluation to rule out an obstructing lesion such as diverticular stricture, etc. to account for her proximal colonic distention and impaction.  For now I agree with tap water enemas, maintain nothing by mouth  We will be available if needed. But recommend GI perform endoscopic evaluation at least to the level of the mid descending colon to rule out stricture or mass  Follow up UTI  Leighton Ruff. Redmond Pulling, MD, FACS General, Bariatric, & Minimally Invasive Surgery [facility], [place]   [facility] M 03/04/2016, 7:41 PM

## 2016-03-04 NOTE — Progress Notes (Signed)
[facility] PROGRESS NOTE Subjective KUB showed massively dilated cecum approximately 11 cm. CT scan was ordered. Reviewed with Dr. Si Gaul.: This essentially pack with stool right colon and transverse colon which is quite long and loops down in the pelvis. Solid stool up until the proximal descending: and then decompressed after that. No clear masses diverticulitis etc. per family patient had aggressive enema therapy in [place]. The patient's family is in the room and reports that she has had about fracture and in bed ridden with narcotics for back pain. There's been no discernible bowel movement for 12 days. Objective: Vital signs in last 24 hours: Temp:  [97.8 F (36.6 C)-98.7 F (37.1 C)] 98.2 F (36.8 C) (07/29 1538) Pulse Rate:  [107-122] 107 (07/29 1538) Resp:  [17-20] 20 (07/29 1538) BP: (125-143)/(60-79) 143/60 (07/29 1538) SpO2:  [94 %-97 %] 96 % (07/29 1538) Weight:  [58.9 kg (129 lb 13.6 oz)] 58.9 kg (129 lb 13.6 oz) (07/28 2048) Last BM Date: 02/21/16  Intake/Output from previous day: 07/28 0701 - 07/29 0700 In: 993.8 [I.V.:993.8] Out: 400 [Urine:400] Intake/Output this shift: Total I/O In: 0  Out: 100 [Urine:100]  PE: General--no acute distress nonicteric  Abdomen--distended no bowel sounds mild diffuse tenderness right greater than left  Lab Results:  Recent Labs  03/04/16 0502  WBC 15.4*  HGB 11.9*  HCT 36.1  PLT 381   BMET  Recent Labs  03/04/16 0502  NA 132*  K 3.3*  CL 96*  CO2 22  CREATININE 0.97   LFT No results for input(s): PROT, AST, ALT, ALKPHOS, BILITOT, BILIDIR, IBILI in the last 72 hours. PT/INR No results for input(s): LABPROT, INR in the last 72 hours. PANCREAS No results for input(s): LIPASE in the last 72 hours.       Studies/Results: Dg Chest 2 View  Result Date: 03/04/2016 CLINICAL DATA:  Leukocytosis. EXAM: CHEST  2 VIEW COMPARISON:  None. FINDINGS: Small bilateral effusions. The heart, hila, an mediastinum  are unremarkable. No pulmonary nodules or masses. No focal infiltrates. Mild pleural thickening laterally in the right chest, likely chronic. No other acute abnormalities. IMPRESSION: Small effusions and underlying atelectasis. Electronically Signed   By: Gerome Sam III M.D   On: 03/04/2016 11:29  Ct Abdomen Pelvis W Contrast  Result Date: 03/04/2016 CLINICAL DATA:  82 year old female with abdominal pain and distention and severe constipation. EXAM: CT ABDOMEN AND PELVIS WITH CONTRAST TECHNIQUE: Multidetector CT imaging of the abdomen and pelvis was performed using the standard protocol following bolus administration of intravenous contrast. CONTRAST:  5mL ISOVUE-300 IOPAMIDOL (ISOVUE-300) INJECTION 61% COMPARISON:  03/04/2016 FINDINGS: Lower chest: Mild cardiomegaly noted. A moderate hiatal hernia is noted. There is filling defect within this hernia which could represent food but difficult to exclude a mass. Very small bilateral pleural effusions and mild bibasilar atelectasis noted. Hepatobiliary: The liver and gallbladder are unremarkable. There is no evidence of biliary dilatation. Pancreas: Atrophic without other significant abnormality. Spleen: Unremarkable Adrenals/Urinary Tract: Bilateral renal atrophy identified. There is no evidence of hydronephrosis. The adrenal glands and bladder are unremarkable. Stomach/Bowel: A very large amount of stool is noted within the cecum, ascending colon, transverse colon and proximal descending colon. Gas and fluid within the cecum is also identified which measures up to 9.5 cm in greatest diameter. There is a relative transition point in the mid descending colon without definite colonic abnormality. No wall thickening is identified. There is no evidence of small bowel dilatation. Vascular/Lymphatic: Aortic atherosclerotic calcifications noted without aneurysm. No enlarged  lymph nodes are identified. Reproductive: The patient is status post hysterectomy. No adnexal  masses identified. Other: No ascites, pneumoperitoneum or abscess. Musculoskeletal: Degenerative changes within the lumbar spine identified. 25% superior endplate compression fracture of L1 is age indeterminate. Mild bony retropulsion noted. IMPRESSION: Distended cecum, ascending colon, transverse colon and proximal descending colon filled with large amount of stool. Relative transition point in the mid descending colon without definite colonic abnormality. Consider direct inspection. No evidence of bowel wall thickening, small bowel obstruction or pneumoperitoneum. Moderate hiatal hernia with filling defect, which could represent feet but a mass is not entirely excluded. Consider direct inspection as indicated. 25% superior endplate compression fracture of L1- indeterminate. Very small bilateral pleural effusions with bibasilar atelectasis. Cardiomegaly. Electronically Signed   By: Harmon Pier M.D.   On: 03/04/2016 16:36  Dg Abd 2 Views  Result Date: 03/04/2016 CLINICAL DATA:  Leukocytosis and constipation EXAM: ABDOMEN - 2 VIEW COMPARISON:  None. FINDINGS: No free air, portal venous gas, or pneumatosis. Small effusions in the lung bases. Markedly dilated colon primarily in the right lower abdomen with air-fluid levels on the upright view. The most dilated loop measures up to 11.7 cm increasing the risk of perforation. The more distal loops of colon are relatively decompressed but air does extend into the rectum. No pneumatosis. No other acute abnormalities. IMPRESSION: There is a markedly dilated loop of colon in the right side of the abdomen. A sigmoid volvulus is not excluded. Recommend a CT scan for further evaluation. Findings being called to Dr. Maisie Fus. Electronically Signed   By: Gerome Sam III M.D   On: 03/04/2016 11:39   Medications: I have reviewed the patient's current medications.  Assessment/Plan: 1. Severe obstipation. The distal: decompressed. No clear mass or obstruction identified.  The area of transition probably needs to be looked at endoscopically. The patient reports that she gets colonoscopies on a routine basis and had the last one several years ago was negative other than diverticular disease. Unfortunately after passing the transition area the patient's colon is essentially filled with solid stool.  P: 1. At this point I would correct the potassium and try to keep it consistently near 5 in order to facilitate normal bowel function. Would place NG tube. Give additional tapwater enemas. 2. Will get surgery on board in case she develops gangrene of colon. No signs of that currently on CT scan. Have discussed with Dr. Andrey Campanile.  Kambre Messner JR,[place] L 03/04/2016, 5:10 PM  This note was created using voice recognition software. Minor errors may Have occurred unintentionally.  Pager: [phone] If no answer or after hours call [phone]

## 2016-03-05 ENCOUNTER — Inpatient Hospital Stay (HOSPITAL_COMMUNITY): Payer: Medicare Other

## 2016-03-05 LAB — BASIC METABOLIC PANEL
Anion gap: 8 (ref 5–15)
BUN: 26 mg/dL — AB (ref 6–20)
CALCIUM: 10.1 mg/dL (ref 8.9–10.3)
CO2: 24 mmol/L (ref 22–32)
Chloride: 102 mmol/L (ref 101–111)
Creatinine, Ser: 0.84 mg/dL (ref 0.44–1.00)
GFR calc Af Amer: >60 mL/min (ref >60)
GFR calc non Af Amer: 60 mL/min — ABNORMAL LOW (ref >60)
GLUCOSE: 99 mg/dL (ref 65–99)
POTASSIUM: 4 mmol/L (ref 3.5–5.1)
Sodium: 134 mmol/L — ABNORMAL LOW (ref 135–145)

## 2016-03-05 LAB — CBC WITH DIFFERENTIAL/PLATELET
Basophils Absolute: 0 10*3/uL (ref 0.0–0.1)
Basophils Relative: 0 %
EOS PCT: 0 %
Eosinophils Absolute: 0 10*3/uL (ref 0.0–0.7)
HEMATOCRIT: 33.5 % — AB (ref 36.0–46.0)
Hemoglobin: 11 g/dL — ABNORMAL LOW (ref 12.0–15.0)
LYMPHS ABS: 2.1 10*3/uL (ref 0.7–4.0)
LYMPHS PCT: 16 %
MCH: 29.1 pg (ref 26.0–34.0)
MCHC: 32.8 g/dL (ref 30.0–36.0)
MCV: 88.6 fL (ref 78.0–100.0)
MONO ABS: 1.6 10*3/uL — AB (ref 0.1–1.0)
MONOS PCT: 12 %
Neutro Abs: 9.5 10*3/uL — ABNORMAL HIGH (ref 1.7–7.7)
Neutrophils Relative %: 72 %
PLATELETS: 378 10*3/uL (ref 150–400)
RBC: 3.78 MIL/uL — ABNORMAL LOW (ref 3.87–5.11)
RDW: 13 % (ref 11.5–15.5)
WBC: 13.2 10*3/uL — ABNORMAL HIGH (ref 4.0–10.5)

## 2016-03-05 LAB — URINE CULTURE

## 2016-03-05 LAB — MAGNESIUM: MAGNESIUM: 2.1 mg/dL (ref 1.7–2.4)

## 2016-03-05 MED ORDER — POTASSIUM CHLORIDE 10 MEQ/100ML IV SOLN
10.0000 meq | INTRAVENOUS | Status: AC
  Administered 2016-03-05 (×2): 10 meq via INTRAVENOUS
  Filled 2016-03-05 (×2): qty 100

## 2016-03-05 MED ORDER — LIDOCAINE VISCOUS 2 % MT SOLN
OROMUCOSAL | Status: AC
  Filled 2016-03-05: qty 15

## 2016-03-05 MED ORDER — DIATRIZOATE MEGLUMINE & SODIUM 66-10 % PO SOLN
ORAL | Status: AC
  Filled 2016-03-05: qty 30

## 2016-03-05 MED ORDER — POLYETHYLENE GLYCOL 3350 17 G PO PACK
17.0000 g | PACK | ORAL | Status: DC
  Administered 2016-03-05 – 2016-03-06 (×10): 17 g via NASOGASTRIC
  Filled 2016-03-05 (×11): qty 1

## 2016-03-05 MED ORDER — LIDOCAINE VISCOUS 2 % MT SOLN
15.0000 mL | Freq: Once | OROMUCOSAL | Status: DC
  Filled 2016-03-05: qty 15

## 2016-03-05 MED ORDER — POTASSIUM CHLORIDE 10 MEQ/100ML IV SOLN
10.0000 meq | INTRAVENOUS | Status: AC
  Administered 2016-03-05: 10 meq via INTRAVENOUS
  Filled 2016-03-05: qty 100

## 2016-03-05 MED ORDER — KETOROLAC TROMETHAMINE 15 MG/ML IJ SOLN
15.0000 mg | Freq: Four times a day (QID) | INTRAMUSCULAR | Status: AC | PRN
  Administered 2016-03-05 – 2016-03-09 (×4): 30 mg via INTRAVENOUS
  Filled 2016-03-05 (×4): qty 2

## 2016-03-05 MED ORDER — DIATRIZOATE MEGLUMINE & SODIUM 66-10 % PO SOLN
28.0000 mL | Freq: Once | ORAL | Status: AC
  Administered 2016-03-04: 13:00:00 via ORAL

## 2016-03-05 MED ORDER — SORBITOL 70 % SOLN
960.0000 mL | TOPICAL_OIL | Freq: Once | ORAL | Status: AC
  Administered 2016-03-05: 960 mL via RECTAL
  Filled 2016-03-05: qty 240

## 2016-03-05 NOTE — Progress Notes (Addendum)
[facility] Progress Note  Subjective: Patient surprisingly feels much better today less abdominal pain subjective distention and obstipation. Still no solid stool response to enemas  Objective: Vital signs in last 24 hours: Temp:  [98.1 F (36.7 C)-98.4 F (36.9 C)] 98.4 F (36.9 C) (07/30 0523) Pulse Rate:  [107-122] 107 (07/30 0523) Resp:  [18-20] 18 (07/30 0523) BP: (125-150)/(60-75) 150/64 (07/30 0523) SpO2:  [93 %-97 %] 93 % (07/30 0523) Weight change:    PE: Abdomen seems less distended and is minimally tender  Lab Results: Results for orders placed or performed during the hospital encounter of 03/03/16 (from the past 24 hour(s))  Magnesium     Status: None   Collection Time: 03/05/16  5:32 AM  Result Value Ref Range   Magnesium 2.1 1.7 - 2.4 mg/dL  Basic metabolic panel     Status: Abnormal   Collection Time: 03/05/16  7:59 AM  Result Value Ref Range   Sodium 134 (L) 135 - 145 mmol/L   Potassium 4.0 3.5 - 5.1 mmol/L   Chloride 102 101 - 111 mmol/L   CO2 24 22 - 32 mmol/L   Glucose, Bld 99 65 - 99 mg/dL   BUN 26 (H) 6 - 20 mg/dL   Creatinine, Ser 0.25 0.44 - 1.00 mg/dL   Calcium 42.7 8.9 - 06.2 mg/dL   GFR calc non Af Amer 60 (L) >60 mL/min   GFR calc Af Amer >60 >60 mL/min   Anion gap 8 5 - 15  CBC with Differential/Platelet     Status: Abnormal   Collection Time: 03/05/16  7:59 AM  Result Value Ref Range   WBC 13.2 (H) 4.0 - 10.5 K/uL   RBC 3.78 (L) 3.87 - 5.11 MIL/uL   Hemoglobin 11.0 (L) 12.0 - 15.0 g/dL   HCT 37.6 (L) 28.3 - 15.1 %   MCV 88.6 78.0 - 100.0 fL   MCH 29.1 26.0 - 34.0 pg   MCHC 32.8 30.0 - 36.0 g/dL   RDW 76.1 60.7 - 37.1 %   Platelets 378 150 - 400 K/uL   Neutrophils Relative % 72 %   Neutro Abs 9.5 (H) 1.7 - 7.7 K/uL   Lymphocytes Relative 16 %   Lymphs Abs 2.1 0.7 - 4.0 K/uL   Monocytes Relative 12 %   Monocytes Absolute 1.6 (H) 0.1 - 1.0 K/uL   Eosinophils Relative 0 %   Eosinophils Absolute 0.0 0.0 - 0.7 K/uL    Basophils Relative 0 %   Basophils Absolute 0.0 0.0 - 0.1 K/uL    Studies/Results: Dg Chest 2 View  Result Date: 03/04/2016 CLINICAL DATA:  Leukocytosis. EXAM: CHEST  2 VIEW COMPARISON:  None. FINDINGS: Small bilateral effusions. The heart, hila, an mediastinum are unremarkable. No pulmonary nodules or masses. No focal infiltrates. Mild pleural thickening laterally in the right chest, likely chronic. No other acute abnormalities. IMPRESSION: Small effusions and underlying atelectasis. Electronically Signed   By: Gerome Sam III M.D   On: 03/04/2016 11:29  Ct Abdomen Pelvis W Contrast  Result Date: 03/04/2016 CLINICAL DATA:  82 year old female with abdominal pain and distention and severe constipation. EXAM: CT ABDOMEN AND PELVIS WITH CONTRAST TECHNIQUE: Multidetector CT imaging of the abdomen and pelvis was performed using the standard protocol following bolus administration of intravenous contrast. CONTRAST:  18mL ISOVUE-300 IOPAMIDOL (ISOVUE-300) INJECTION 61% COMPARISON:  03/04/2016 FINDINGS: Lower chest: Mild cardiomegaly noted. A moderate hiatal hernia is noted. There is filling defect within this hernia which could represent food but  difficult to exclude a mass. Very small bilateral pleural effusions and mild bibasilar atelectasis noted. Hepatobiliary: The liver and gallbladder are unremarkable. There is no evidence of biliary dilatation. Pancreas: Atrophic without other significant abnormality. Spleen: Unremarkable Adrenals/Urinary Tract: Bilateral renal atrophy identified. There is no evidence of hydronephrosis. The adrenal glands and bladder are unremarkable. Stomach/Bowel: A very large amount of stool is noted within the cecum, ascending colon, transverse colon and proximal descending colon. Gas and fluid within the cecum is also identified which measures up to 9.5 cm in greatest diameter. There is a relative transition point in the mid descending colon without definite colonic abnormality.  No wall thickening is identified. There is no evidence of small bowel dilatation. Vascular/Lymphatic: Aortic atherosclerotic calcifications noted without aneurysm. No enlarged lymph nodes are identified. Reproductive: The patient is status post hysterectomy. No adnexal masses identified. Other: No ascites, pneumoperitoneum or abscess. Musculoskeletal: Degenerative changes within the lumbar spine identified. 25% superior endplate compression fracture of L1 is age indeterminate. Mild bony retropulsion noted. IMPRESSION: Distended cecum, ascending colon, transverse colon and proximal descending colon filled with large amount of stool. Relative transition point in the mid descending colon without definite colonic abnormality. Consider direct inspection. No evidence of bowel wall thickening, small bowel obstruction or pneumoperitoneum. Moderate hiatal hernia with filling defect, which could represent feet but a mass is not entirely excluded. Consider direct inspection as indicated. 25% superior endplate compression fracture of L1- indeterminate. Very small bilateral pleural effusions with bibasilar atelectasis. Cardiomegaly. Electronically Signed   By: Harmon Pier M.D.   On: 03/04/2016 16:36  Dg Abd 2 Views  Result Date: 03/05/2016 CLINICAL DATA:  Obstipation EXAM: ABDOMEN - 2 VIEW COMPARISON:  CT 03/04/2016 FINDINGS: Diffuse gaseous distention of the colon, most pronounced involving the cecum. Cecum measures approximately 14 cm in diameter. When the cecal diameter was measured on the scout image on prior study, this measured 13 cm. No free air organomegaly. IMPRESSION: Marked gaseous distention of the colon, particularly cecum. This is similar to prior CT. Electronically Signed   By: Charlett Nose M.D.   On: 03/05/2016 09:05  Dg Abd 2 Views  Result Date: 03/04/2016 CLINICAL DATA:  Leukocytosis and constipation EXAM: ABDOMEN - 2 VIEW COMPARISON:  None. FINDINGS: No free air, portal venous gas, or pneumatosis.  Small effusions in the lung bases. Markedly dilated colon primarily in the right lower abdomen with air-fluid levels on the upright view. The most dilated loop measures up to 11.7 cm increasing the risk of perforation. The more distal loops of colon are relatively decompressed but air does extend into the rectum. No pneumatosis. No other acute abnormalities. IMPRESSION: There is a markedly dilated loop of colon in the right side of the abdomen. A sigmoid volvulus is not excluded. Recommend a CT scan for further evaluation. Findings being called to Dr. Maisie Fus. Electronically Signed   By: Gerome Sam III M.D   On: 03/04/2016 11:39     Assessment: Fecal impaction with large amount of stool down to the descending colon with cecal diameter 9.5 cm. No obvious mass or volvulus. Attempts at NG placement last night were unsuccessful.  Plan: Given the improvement, will hold off on colonoscopy today but recommend and have ordered NG placement  with administration of Mira lax q 2 hrs per NG tube. Will reassess tomorrow and if no results will need endoscopic inspection soon.  Tanga Gloor C 03/05/2016, 10:22 AM  Pager [phone] If no answer or after 5 PM call [phone]

## 2016-03-05 NOTE — Progress Notes (Addendum)
PROGRESS NOTE    Tara Frederick  JKK:[id number] DOB: 1924-07-19 DOA: 03/03/2016 PCP: No primary care provider on file.    Brief Narrative:  Tara Frederick is a 82 y.o. female with medical history significant of HTN, GERD, degenerative disk disease.  Patient given narcotics in ED last week for back pain.  Since then she has had severe constipation.  Took narcotic last on Sunday.  No BM since then.  Presented to [facility] 3 days ago.  Was admitted.  CT scan at that time showed severe constipation.  Attempts made to treat medically include: 2L golytely, miralax BID, dulcolax 20mg  PO daily, senna, multiple fleet enemas, and 2 attempts at manual disimpaction (no stool in rectum).  All of the above attempts to treat medically have failed, and today patient finally developed nausea and ultimately numerous episodes of vomiting on ambulance ride down here.  Assessment & Plan:   Principal Problem:   Obstipation Active Problems:   UTI (lower urinary tract infection)   Benign essential HTN   GERD (gastroesophageal reflux disease)   Leukocytosis  #1 obstipation Patient admitted with concerns for obstipation. Patient's last bowel movement was July 12 of family after receiving narcotic pain medications for back injury. Patient with worsening abdominal distention and inability to have bowel movements the site multiple laxatives and multiple Fleet enemas as well as an attempt at manual disimpaction with no stool noted in the rectal vault. Patient with some nausea today no emesis. Plain films of the abdomen with a markedly dilated loop of colon in the right side of the abdomen measuring approximately 11.7 cm. Increasing risk for perforation. Concern for possible sigmoid volvulus. CT abdomen and pelvis done 03/04/2016 with a distended cecum measuring 9.5 cm, ascending colon transverse colon and proximal descending colon filled with large amount of stool. 25% superior endplate compression fracture of L1 indeterminate.    Multiple attempts at NG tube placement overnight was unsuccessful. Patient also had multiple tap water enemas were no results. NG tube placement under fluoroscopy per radiology has been ordered by GI. Continue bowel rest. IV fluids. Supportive care. GI has been consulted and following. Keep magnesium greater than 2. Keep potassium close to 5. Will give 3 runs of KCl 10 mEq.  #2 probable UTI Urine cultures pending. IV Levaquin.  #3 leukocytosis Likely secondary to problem #2 versus reactive leukocytosis. Urine cultures pending. WBC trending down. Continue empiric IV Levaquin.  #4 gastroesophageal reflux disease PPI.  #5 hypertension Continue Norvasc.   DVT prophylaxis: Lovenox Code Status: Full Family Communication: Updated patient. No family at bedside. Disposition Plan: Home once acute medical issues have resolved and patient is stable and tolerating oral intake with good bowel movements.   Consultants:   Gastroenterology: Dr. Madilyn Fireman 03/04/2016  Procedures:   CXR 03/04/2016  KUB 03/04/2016  CT abdomen and pelvis 03/04/2016  Antimicrobials:   IV Levaquin 03/04/2016   Subjective: Patient sitting up in chair by the window with some back pain. Patient states abdominal pain improved. No flatus. No bowel movement despite several tap water enemas overnight. NG tube was unable to be placed. Last BM 02/16/2016. No SOB. No CP.  Objective: Vitals:   03/04/16 1311 03/04/16 1538 03/04/16 2106 03/05/16 0523  BP: 125/75 (!) 143/60 136/70 (!) 150/64  Pulse: (!) 122 (!) 107 (!) 109 (!) 107  Resp: 20 20 18 18   Temp: 98.1 F (36.7 C) 98.2 F (36.8 C) 98.3 F (36.8 C) 98.4 F (36.9 C)  TempSrc: Oral Oral Oral Oral  SpO2:  97% 96% 93% 93%  Weight:        Intake/Output Summary (Last 24 hours) at 03/05/16 1031 Last data filed at 03/05/16 0840  Gross per 24 hour  Intake                0 ml  Output              800 ml  Net             -800 ml   Filed Weights   03/03/16 2048   Weight: 58.9 kg (129 lb 13.6 oz)    Examination:  General exam: Appears calm and comfortable  Respiratory system: Clear to auscultation anterior lung fields. Cardiovascular system: S1 & S2 heard, RRR. No JVD, murmurs, rubs, gallops or clicks. No pedal edema. Gastrointestinal system: Abdomen is distended, less diffusely TTP in RLQ. Soft. No organomegaly or masses felt. Normal bowel sounds heard. Central nervous system: Alert and oriented. No focal neurological deficits. Extremities: Symmetric 5 x 5 power. Skin: No rashes, lesions or ulcers Psychiatry: Judgement and insight appear normal. Mood & affect appropriate.     Data Reviewed: I have personally reviewed following labs and imaging studies  CBC:  Recent Labs Lab 03/04/16 0502 03/05/16 0759  WBC 15.4* 13.2*  NEUTROABS  --  9.5*  HGB 11.9* 11.0*  HCT 36.1 33.5*  MCV 87.6 88.6  PLT 381 378   Basic Metabolic Panel:  Recent Labs Lab 03/04/16 0502 03/05/16 0532 03/05/16 0759  NA 132*  --  134*  K 3.3*  --  4.0  CL 96*  --  102  CO2 22  --  24  GLUCOSE 104*  --  99  BUN 22*  --  26*  CREATININE 0.97  --  0.84  CALCIUM 10.6*  --  10.1  MG 1.5* 2.1  --    GFR: CrCl cannot be calculated (Unknown ideal weight.). Liver Function Tests: No results for input(s): AST, ALT, ALKPHOS, BILITOT, PROT, ALBUMIN in the last 168 hours. No results for input(s): LIPASE, AMYLASE in the last 168 hours. No results for input(s): AMMONIA in the last 168 hours. Coagulation Profile: No results for input(s): INR, PROTIME in the last 168 hours. Cardiac Enzymes: No results for input(s): CKTOTAL, CKMB, CKMBINDEX, TROPONINI in the last 168 hours. BNP (last 3 results) No results for input(s): PROBNP in the last 8760 hours. HbA1C: No results for input(s): HGBA1C in the last 72 hours. CBG: No results for input(s): GLUCAP in the last 168 hours. Lipid Profile: No results for input(s): CHOL, HDL, LDLCALC, TRIG, CHOLHDL, LDLDIRECT in the last  72 hours. Thyroid Function Tests: No results for input(s): TSH, T4TOTAL, FREET4, T3FREE, THYROIDAB in the last 72 hours. Anemia Panel: No results for input(s): VITAMINB12, FOLATE, FERRITIN, TIBC, IRON, RETICCTPCT in the last 72 hours. Sepsis Labs: No results for input(s): PROCALCITON, LATICACIDVEN in the last 168 hours.  No results found for this or any previous visit (from the past 240 hour(s)).       Radiology Studies: Dg Chest 2 View  Result Date: 03/04/2016 CLINICAL DATA:  Leukocytosis. EXAM: CHEST  2 VIEW COMPARISON:  None. FINDINGS: Small bilateral effusions. The heart, hila, an mediastinum are unremarkable. No pulmonary nodules or masses. No focal infiltrates. Mild pleural thickening laterally in the right chest, likely chronic. No other acute abnormalities. IMPRESSION: Small effusions and underlying atelectasis. Electronically Signed   By: Gerome Sam III M.D   On: 03/04/2016 11:29  Ct Abdomen Pelvis W Contrast  Result Date: 03/04/2016 CLINICAL DATA:  82 year old female with abdominal pain and distention and severe constipation. EXAM: CT ABDOMEN AND PELVIS WITH CONTRAST TECHNIQUE: Multidetector CT imaging of the abdomen and pelvis was performed using the standard protocol following bolus administration of intravenous contrast. CONTRAST:  80mL ISOVUE-300 IOPAMIDOL (ISOVUE-300) INJECTION 61% COMPARISON:  03/04/2016 FINDINGS: Lower chest: Mild cardiomegaly noted. A moderate hiatal hernia is noted. There is filling defect within this hernia which could represent food but difficult to exclude a mass. Very small bilateral pleural effusions and mild bibasilar atelectasis noted. Hepatobiliary: The liver and gallbladder are unremarkable. There is no evidence of biliary dilatation. Pancreas: Atrophic without other significant abnormality. Spleen: Unremarkable Adrenals/Urinary Tract: Bilateral renal atrophy identified. There is no evidence of hydronephrosis. The adrenal glands and bladder are  unremarkable. Stomach/Bowel: A very large amount of stool is noted within the cecum, ascending colon, transverse colon and proximal descending colon. Gas and fluid within the cecum is also identified which measures up to 9.5 cm in greatest diameter. There is a relative transition point in the mid descending colon without definite colonic abnormality. No wall thickening is identified. There is no evidence of small bowel dilatation. Vascular/Lymphatic: Aortic atherosclerotic calcifications noted without aneurysm. No enlarged lymph nodes are identified. Reproductive: The patient is status post hysterectomy. No adnexal masses identified. Other: No ascites, pneumoperitoneum or abscess. Musculoskeletal: Degenerative changes within the lumbar spine identified. 25% superior endplate compression fracture of L1 is age indeterminate. Mild bony retropulsion noted. IMPRESSION: Distended cecum, ascending colon, transverse colon and proximal descending colon filled with large amount of stool. Relative transition point in the mid descending colon without definite colonic abnormality. Consider direct inspection. No evidence of bowel wall thickening, small bowel obstruction or pneumoperitoneum. Moderate hiatal hernia with filling defect, which could represent feet but a mass is not entirely excluded. Consider direct inspection as indicated. 25% superior endplate compression fracture of L1- indeterminate. Very small bilateral pleural effusions with bibasilar atelectasis. Cardiomegaly. Electronically Signed   By: Harmon Pier M.D.   On: 03/04/2016 16:36  Dg Abd 2 Views  Result Date: 03/05/2016 CLINICAL DATA:  Obstipation EXAM: ABDOMEN - 2 VIEW COMPARISON:  CT 03/04/2016 FINDINGS: Diffuse gaseous distention of the colon, most pronounced involving the cecum. Cecum measures approximately 14 cm in diameter. When the cecal diameter was measured on the scout image on prior study, this measured 13 cm. No free air organomegaly. IMPRESSION:  Marked gaseous distention of the colon, particularly cecum. This is similar to prior CT. Electronically Signed   By: Charlett Nose M.D.   On: 03/05/2016 09:05  Dg Abd 2 Views  Result Date: 03/04/2016 CLINICAL DATA:  Leukocytosis and constipation EXAM: ABDOMEN - 2 VIEW COMPARISON:  None. FINDINGS: No free air, portal venous gas, or pneumatosis. Small effusions in the lung bases. Markedly dilated colon primarily in the right lower abdomen with air-fluid levels on the upright view. The most dilated loop measures up to 11.7 cm increasing the risk of perforation. The more distal loops of colon are relatively decompressed but air does extend into the rectum. No pneumatosis. No other acute abnormalities. IMPRESSION: There is a markedly dilated loop of colon in the right side of the abdomen. A sigmoid volvulus is not excluded. Recommend a CT scan for further evaluation. Findings being called to Dr. Maisie Fus. Electronically Signed   By: Gerome Sam III M.D   On: 03/04/2016 11:39       Scheduled Meds: . amLODipine  5 mg Oral Daily  . enoxaparin (  LOVENOX) injection  40 mg Subcutaneous QHS  . latanoprost  1 drop Left Eye QHS  . levofloxacin (LEVAQUIN) IV  750 mg Intravenous Q24H  . pantoprazole  40 mg Oral Daily  . polyethylene glycol  17 g Per NG tube Q2H while awake   Continuous Infusions: . sodium chloride 0.9 % 1,000 mL with potassium chloride 40 mEq infusion 75 mL/hr at 03/05/16 0720     LOS: 2 days    Time spent: 35 mins    THOMPSON,DANIEL, MD [facility] Pager [phone]  If 7PM-7AM, please contact night-coverage [email or web address] Password [email or web address] 03/05/2016, 10:31 AM

## 2016-03-06 ENCOUNTER — Inpatient Hospital Stay (HOSPITAL_COMMUNITY): Payer: Medicare Other

## 2016-03-06 LAB — BASIC METABOLIC PANEL
Anion gap: 11 (ref 5–15)
BUN: 26 mg/dL — AB (ref 6–20)
CALCIUM: 10.6 mg/dL — AB (ref 8.9–10.3)
CO2: 21 mmol/L — AB (ref 22–32)
Chloride: 103 mmol/L (ref 101–111)
Creatinine, Ser: 0.87 mg/dL (ref 0.44–1.00)
GFR calc Af Amer: >60 mL/min (ref >60)
GFR, EST NON AFRICAN AMERICAN: 57 mL/min — AB (ref >60)
GLUCOSE: 89 mg/dL (ref 65–99)
Potassium: 4.3 mmol/L (ref 3.5–5.1)
Sodium: 135 mmol/L (ref 135–145)

## 2016-03-06 LAB — MAGNESIUM: MAGNESIUM: 1.7 mg/dL (ref 1.7–2.4)

## 2016-03-06 LAB — CBC
HEMATOCRIT: 35.4 % — AB (ref 36.0–46.0)
Hemoglobin: 11.4 g/dL — ABNORMAL LOW (ref 12.0–15.0)
MCH: 28.7 pg (ref 26.0–34.0)
MCHC: 32.2 g/dL (ref 30.0–36.0)
MCV: 89.2 fL (ref 78.0–100.0)
Platelets: 417 10*3/uL — ABNORMAL HIGH (ref 150–400)
RBC: 3.97 MIL/uL (ref 3.87–5.11)
RDW: 13.1 % (ref 11.5–15.5)
WBC: 16.4 10*3/uL — ABNORMAL HIGH (ref 4.0–10.5)

## 2016-03-06 MED ORDER — LEVOFLOXACIN IN D5W 750 MG/150ML IV SOLN
750.0000 mg | INTRAVENOUS | Status: DC
  Administered 2016-03-08 – 2016-03-10 (×2): 750 mg via INTRAVENOUS
  Filled 2016-03-06 (×2): qty 150

## 2016-03-06 MED ORDER — WHITE PETROLATUM GEL
Status: AC
  Administered 2016-03-06: 0.2
  Filled 2016-03-06: qty 1

## 2016-03-06 MED ORDER — PHENOL 1.4 % MT LIQD
1.0000 | OROMUCOSAL | Status: DC | PRN
  Administered 2016-03-06: 1 via OROMUCOSAL
  Filled 2016-03-06: qty 177

## 2016-03-06 MED ORDER — MAGNESIUM SULFATE 50 % IJ SOLN
3.0000 g | Freq: Once | INTRAVENOUS | Status: AC
  Administered 2016-03-06: 3 g via INTRAVENOUS
  Filled 2016-03-06: qty 6

## 2016-03-06 MED ORDER — FAMOTIDINE IN NACL 20-0.9 MG/50ML-% IV SOLN
20.0000 mg | Freq: Two times a day (BID) | INTRAVENOUS | Status: DC
  Administered 2016-03-06 – 2016-03-09 (×8): 20 mg via INTRAVENOUS
  Filled 2016-03-06 (×10): qty 50

## 2016-03-06 NOTE — Progress Notes (Signed)
Subjective: Still no BM. No flatus today. Abdomen "feels very tight". Patient is uncomfortable. Tachycardic and hypertensive. NG output 890 cc. Throat is sore. Accompanied by daughter. WBC 16.4 trending up from 15.4 on 03/04/16.  Objective: Vital signs in last 24 hours: Temp:  [97.6 F (36.4 C)-98.2 F (36.8 C)] 98.2 F (36.8 C) (07/31 0520) Pulse Rate:  [112-120] 120 (07/31 0520) Resp:  [18] 18 (07/31 0520) BP: (142-155)/(68-70) 142/70 (07/31 0520) SpO2:  [93 %-99 %] 99 % (07/31 0520) Last BM Date: 02/21/16  Intake/Output from previous day: 07/30 0701 - 07/31 0700 In: 3612.5 [I.V.:2722.5; NG/GT:890] Out: 1200 [Urine:1200] Intake/Output this shift: Total I/O In: 0  Out: 50 [Urine:50]  General appearance  Supine. Awake and talkative.  Head: Normocephalic/atraumatic. PERRL bilaterally. Extraocular eye muscles in tact.  Neck: FROM Cardiovascular: Regular rate and rhythm without gallops, murmurs or rubs Respiratory: Lung sounds vesicular. No wheezes, crackles or rhonchi auscultated Abdomen: Distended but not rigid. No tenderness to light palpation. No guarding. Hypoactive BS.    Lab Results:   Recent Labs  03/05/16 0759 03/06/16 0621  WBC 13.2* 16.4*  HGB 11.0* 11.4*  HCT 33.5* 35.4*  PLT 378 417*   BMET  Recent Labs  03/05/16 0759 03/06/16 0621  NA 134* 135  K 4.0 4.3  CL 102 103  CO2 24 21*  GLUCOSE 99 89  BUN 26* 26*  CREATININE 0.84 0.87  CALCIUM 10.1 10.6*   PT/INR No results for input(s): LABPROT, INR in the last 72 hours. ABG No results for input(s): PHART, HCO3 in the last 72 hours.  Invalid input(s): PCO2, PO2  Studies/Results: Dg Chest 2 View  Result Date: 03/04/2016 CLINICAL DATA:  Leukocytosis. EXAM: CHEST  2 VIEW COMPARISON:  None. FINDINGS: Small bilateral effusions. The heart, hila, an mediastinum are unremarkable. No pulmonary nodules or masses. No focal infiltrates. Mild pleural thickening laterally in the right chest, likely  chronic. No other acute abnormalities. IMPRESSION: Small effusions and underlying atelectasis. Electronically Signed   By: Gerome Sam III M.D   On: 03/04/2016 11:29  Ct Abdomen Pelvis W Contrast  Result Date: 03/04/2016 CLINICAL DATA:  82 year old female with abdominal pain and distention and severe constipation. EXAM: CT ABDOMEN AND PELVIS WITH CONTRAST TECHNIQUE: Multidetector CT imaging of the abdomen and pelvis was performed using the standard protocol following bolus administration of intravenous contrast. CONTRAST:  80mL ISOVUE-300 IOPAMIDOL (ISOVUE-300) INJECTION 61% COMPARISON:  03/04/2016 FINDINGS: Lower chest: Mild cardiomegaly noted. A moderate hiatal hernia is noted. There is filling defect within this hernia which could represent food but difficult to exclude a mass. Very small bilateral pleural effusions and mild bibasilar atelectasis noted. Hepatobiliary: The liver and gallbladder are unremarkable. There is no evidence of biliary dilatation. Pancreas: Atrophic without other significant abnormality. Spleen: Unremarkable Adrenals/Urinary Tract: Bilateral renal atrophy identified. There is no evidence of hydronephrosis. The adrenal glands and bladder are unremarkable. Stomach/Bowel: A very large amount of stool is noted within the cecum, ascending colon, transverse colon and proximal descending colon. Gas and fluid within the cecum is also identified which measures up to 9.5 cm in greatest diameter. There is a relative transition point in the mid descending colon without definite colonic abnormality. No wall thickening is identified. There is no evidence of small bowel dilatation. Vascular/Lymphatic: Aortic atherosclerotic calcifications noted without aneurysm. No enlarged lymph nodes are identified. Reproductive: The patient is status post hysterectomy. No adnexal masses identified. Other: No ascites, pneumoperitoneum or abscess. Musculoskeletal: Degenerative changes within the lumbar spine  identified.  25% superior endplate compression fracture of L1 is age indeterminate. Mild bony retropulsion noted. IMPRESSION: Distended cecum, ascending colon, transverse colon and proximal descending colon filled with large amount of stool. Relative transition point in the mid descending colon without definite colonic abnormality. Consider direct inspection. No evidence of bowel wall thickening, small bowel obstruction or pneumoperitoneum. Moderate hiatal hernia with filling defect, which could represent feet but a mass is not entirely excluded. Consider direct inspection as indicated. 25% superior endplate compression fracture of L1- indeterminate. Very small bilateral pleural effusions with bibasilar atelectasis. Cardiomegaly. Electronically Signed   By: Harmon Pier M.D.   On: 03/04/2016 16:36  Dg Abd 2 Views  Result Date: 03/06/2016 CLINICAL DATA:  82 year old female with obstipation. Subsequent encounter. EXAM: ABDOMEN - 2 VIEW COMPARISON:  03/05/2016 plain film exam.  03/04/2016 FINDINGS: Nasogastric tube is in place. Tip projects over the left upper quadrant. Side hole just below the diaphragm level. Patient has a hiatal hernia as noted on recent CT. Gas distended colon. Dilated cecum difficult adequately measure as there may be a superior medial displacement of the cecum. Using similar landmarks to recent plain film examination. Dilated proximal right colon/cecum now spanning over 19.2 cm versus prior 14.3 cm. Progressive distention of gas distended transverse colon measuring up to 8.2 cm versus prior 6.7 cm. Cannot exclude obstructing lesion.  Please see recent CT report. No free intraperitoneal air noted on decubitus view. IMPRESSION: Progressive distension of the right colon/cecum and transverse colon as noted above. Electronically Signed   By: Lacy Duverney M.D.   On: 03/06/2016 08:00  Dg Abd 2 Views  Result Date: 03/05/2016 CLINICAL DATA:  Obstipation EXAM: ABDOMEN - 2 VIEW COMPARISON:  CT  03/04/2016 FINDINGS: Diffuse gaseous distention of the colon, most pronounced involving the cecum. Cecum measures approximately 14 cm in diameter. When the cecal diameter was measured on the scout image on prior study, this measured 13 cm. No free air organomegaly. IMPRESSION: Marked gaseous distention of the colon, particularly cecum. This is similar to prior CT. Electronically Signed   By: Charlett Nose M.D.   On: 03/05/2016 09:05  Dg Abd 2 Views  Result Date: 03/04/2016 CLINICAL DATA:  Leukocytosis and constipation EXAM: ABDOMEN - 2 VIEW COMPARISON:  None. FINDINGS: No free air, portal venous gas, or pneumatosis. Small effusions in the lung bases. Markedly dilated colon primarily in the right lower abdomen with air-fluid levels on the upright view. The most dilated loop measures up to 11.7 cm increasing the risk of perforation. The more distal loops of colon are relatively decompressed but air does extend into the rectum. No pneumatosis. No other acute abnormalities. IMPRESSION: There is a markedly dilated loop of colon in the right side of the abdomen. A sigmoid volvulus is not excluded. Recommend a CT scan for further evaluation. Findings being called to Dr. Maisie Fus. Electronically Signed   By: Gerome Sam III M.D   On: 03/04/2016 11:39  Dg Basil Dess Tube Plc W/fl W/rad  Result Date: 03/05/2016 CLINICAL DATA:  Colonic distention. Request for nasogastric tube placement. Difficult to place a nasogastric tube on the floor. EXAM: NASO G TUBE PLACEMENT WITH FL AND WITH RAD CONTRAST:  28 mL of Gastrografin injected through nasogastric tube FLUOROSCOPY TIME:  If the device does not provide the exposure index: Fluoroscopy Time (in minutes and seconds):  1 minutes and 6 seconds Number of Acquired Images:  2 COMPARISON:  Abdominal CT 03/04/2016 FINDINGS: Both nostrils anesthetized with viscous lidocaine. 14 French nasogastric  tube would not easily advance through the right nostril. The tube easily advanced through  the left nostril. Initially, the tube appeared to advance into the airway. The tube was pulled back and directed into the esophagus. Tube was advanced into the upper abdomen. Contrast injection confirmed placement in the stomach. IMPRESSION: Successful placement of a nasogastric tube with fluoroscopy. Catheter tip in the stomach. Electronically Signed   By: Richarda Overlie M.D.   On: 03/05/2016 14:36   Anti-infectives: Anti-infectives    Start     Dose/Rate Route Frequency Ordered Stop   03/04/16 0800  levofloxacin (LEVAQUIN) IVPB 750 mg     750 mg 100 mL/hr over 90 Minutes Intravenous Every 24 hours 03/03/16 2221        Assessment/Plan: Constipation/Obstipation  No BM since July 12 - refractory to laxative and Fleet enema therapy, as well as attempts at manual disempaction. Abdominal films show progressively dilated cecum, right colon and transverse colon. Concern for sigmoid volvulus. NG tube placed. Output 890 cc in last 24 hours. GI following, endoscopic evaluation likely necessary.   UTI- Will be treated with Levaquin   FEN: NPO VTE: Lovenox  ID: [id number]  Dispo: Endoscopic evaluation   Orvil Feil [facility] 03/06/2016

## 2016-03-06 NOTE — Progress Notes (Signed)
Tara Frederick 3:46 PM  Subjective: Patient seen and examined and case discussed with her daughter and she is feeling better and is had 2 bowel movements although one had a little bit of bright red blood in it she does not have abdominal pain and her hospital computer chart was reviewed and her case discussed with my partner Dr. Madilyn Fireman and her last colonoscopy was in 2008 per her [facility] GI doctor and we are awaiting the fax  Objective: Vital signs stable afebrile no acute distress abdomen is soft rare bowel sounds nontender labs okay x-rays reviewed  Assessment: Improved obstipation and constipation  Plan: Await a.m. labs and x-rays if doing better may clamp NG and have clear liquids and slowly decrease dose of MiraLAX but continue it long-term and will check on tomorrow  [facility] E  Pager [phone] After 5PM or if no answer call [phone]

## 2016-03-06 NOTE — Progress Notes (Signed)
PROGRESS NOTE    Tara Frederick  VAP:[id number] DOB: 08/12/1923 DOA: 03/03/2016 PCP: No primary care provider on file.    Brief Narrative:  Tara Frederick is a 82 y.o. female with medical history significant of HTN, GERD, degenerative disk disease.  Patient given narcotics in ED last week for back pain.  Since then she has had severe constipation.  Took narcotic last on Sunday.  No BM since then.  Presented to [facility] 3 days ago.  Was admitted.  CT scan at that time showed severe constipation.  Attempts made to treat medically include: 2L golytely, miralax BID, dulcolax 20mg  PO daily, senna, multiple fleet enemas, and 2 attempts at manual disimpaction (no stool in rectum).  All of the above attempts to treat medically have failed, and today patient finally developed nausea and ultimately numerous episodes of vomiting on ambulance ride down here.  Assessment & Plan:   Principal Problem:   Obstipation Active Problems:   UTI (lower urinary tract infection)   Benign essential HTN   GERD (gastroesophageal reflux disease)   Leukocytosis  #1 obstipation Patient admitted with concerns for obstipation. Patient's last bowel movement was July 12 of family after receiving narcotic pain medications for back injury. Patient with worsening abdominal distention and inability to have bowel movements the site multiple laxatives and multiple Fleet enemas as well as an attempt at manual disimpaction with no stool noted in the rectal vault. Patient with some nausea today no emesis. Plain films of the abdomen with a markedly dilated loop of colon in the right side of the abdomen measuring approximately 11.7 cm. Increasing risk for perforation. Concern for possible sigmoid volvulus. CT abdomen and pelvis done 03/04/2016 with a distended cecum measuring 9.5 cm, ascending colon transverse colon and proximal descending colon filled with large amount of stool. 25% superior endplate compression fracture of L1 indeterminate.    Multiple attempts at NG tube placement the night of 03/04/2016 was unsuccessful. Patient also had multiple tap water enemas were no results. NG tube placed under fluoroscopy per radiology 03/05/2016. NG tube to suction. Continue bowel rest. IV fluids. Supportive care. GI following and patient may likely need endoscopic inspection as abdominal films this morning with progressive distention of the right colon/cecum and transverse colon. Patient also noted to have no results from every 2 hour MiraLAX given yesterday as well as smog enema. Keep magnesium greater than 2. Keep potassium close to 5.   #2 probable UTI Urine cultures with multiple morphotypes IV Levaquin.  #3 leukocytosis Likely secondary to problem #2 versus reactive leukocytosis. WBC trending up. Urine cultures with multiple morphotypes. Continue empiric IV Levaquin.  #4 gastroesophageal reflux disease Change PPI to IV.  #5 hypertension Continue Norvasc.   DVT prophylaxis: Lovenox Code Status: Full Family Communication: Updated patient. No family at bedside. Disposition Plan: Home once acute medical issues have resolved and patient is stable and tolerating oral intake with good bowel movements.   Consultants:   Gastroenterology: Dr. Madilyn Fireman 03/04/2016  Procedures:   CXR 03/04/2016  KUB 03/04/2016  CT abdomen and pelvis 03/04/2016  Antimicrobials:   IV Levaquin 03/04/2016   Subjective: Patient states she's not feeling too well today. Patient is back from abdominal x-ray NG tube has been clamped and patient states she's been spitting up since then. Patient denies any chest pain. Patient does endorse abdominal pain. Patient denies any flatus. No bowel movements.  Objective: Vitals:   03/05/16 0523 03/05/16 1458 03/05/16 2125 03/06/16 0520  BP: (!) 150/64 (!) 151/69 Marland Kitchen)  155/68 (!) 142/70  Pulse: (!) 107 (!) 117 (!) 112 (!) 120  Resp: Temp: 98.4 F (36.9 C) 97.6 F (36.4 C) 97.8 F (36.6 C) 98.2 F  (36.8 C)  TempSrc: Oral Oral Oral Oral  SpO2: 93% 97% 93% 99%  Weight:        Intake/Output Summary (Last 24 hours) at 03/06/16 0905 Last data filed at 03/06/16 0856  Gross per 24 hour  Intake           3612.5 ml  Output             1100 ml  Net           2512.5 ml   Filed Weights   03/03/16 2048  Weight: 58.9 kg (129 lb 13.6 oz)    Examination:  General exam: Appears calm and comfortable  Respiratory system: Clear to auscultation anterior lung fields.Shallow respirations. Cardiovascular system: S1 & S2 heard, RRR. No JVD, murmurs, rubs, gallops or clicks. No pedal edema. Gastrointestinal system: Abdomen is distended, TTP in RLQ > rest of abdomen. No organomegaly or masses felt. Absent bowel sounds. Central nervous system: Alert and oriented. No focal neurological deficits. Extremities: Symmetric 5 x 5 power. Skin: No rashes, lesions or ulcers Psychiatry: Judgement and insight appear normal. Mood & affect appropriate.     Data Reviewed: I have personally reviewed following labs and imaging studies  CBC:  Recent Labs Lab 03/04/16 0502 03/05/16 0759 03/06/16 0621  WBC 15.4* 13.2* 16.4*  NEUTROABS  --  9.5*  --   HGB 11.9* 11.0* 11.4*  HCT 36.1 33.5* 35.4*  MCV 87.6 88.6 89.2  PLT 381 378 417*   Basic Metabolic Panel:  Recent Labs Lab 03/04/16 0502 03/05/16 0532 03/05/16 0759 03/06/16 0621  NA 132*  --  134* 135  K 3.3*  --  4.0 4.3  CL 96*  --  102 103  CO2 22  --  24 21*  GLUCOSE 104*  --  99 89  BUN 22*  --  26* 26*  CREATININE 0.97  --  0.84 0.87  CALCIUM 10.6*  --  10.1 10.6*  MG 1.5* 2.1  --  1.7   GFR: CrCl cannot be calculated (Unknown ideal weight.). Liver Function Tests: No results for input(s): AST, ALT, ALKPHOS, BILITOT, PROT, ALBUMIN in the last 168 hours. No results for input(s): LIPASE, AMYLASE in the last 168 hours. No results for input(s): AMMONIA in the last 168 hours. Coagulation Profile: No results for input(s): INR, PROTIME in  the last 168 hours. Cardiac Enzymes: No results for input(s): CKTOTAL, CKMB, CKMBINDEX, TROPONINI in the last 168 hours. BNP (last 3 results) No results for input(s): PROBNP in the last 8760 hours. HbA1C: No results for input(s): HGBA1C in the last 72 hours. CBG: No results for input(s): GLUCAP in the last 168 hours. Lipid Profile: No results for input(s): CHOL, HDL, LDLCALC, TRIG, CHOLHDL, LDLDIRECT in the last 72 hours. Thyroid Function Tests: No results for input(s): TSH, T4TOTAL, FREET4, T3FREE, THYROIDAB in the last 72 hours. Anemia Panel: No results for input(s): VITAMINB12, FOLATE, FERRITIN, TIBC, IRON, RETICCTPCT in the last 72 hours. Sepsis Labs: No results for input(s): PROCALCITON, LATICACIDVEN in the last 168 hours.  Recent Results (from the past 240 hour(s))  Urine culture     Status: Abnormal   Collection Time: 03/04/16  6:07 PM  Result Value Ref Range Status   Specimen Description URINE, RANDOM  Final   Special Requests NONE  Final   Culture MULTIPLE SPECIES PRESENT, SUGGEST RECOLLECTION (A)  Final   Report Status 03/05/2016 FINAL  Final         Radiology Studies: Dg Chest 2 View  Result Date: 03/04/2016 CLINICAL DATA:  Leukocytosis. EXAM: CHEST  2 VIEW COMPARISON:  None. FINDINGS: Small bilateral effusions. The heart, hila, an mediastinum are unremarkable. No pulmonary nodules or masses. No focal infiltrates. Mild pleural thickening laterally in the right chest, likely chronic. No other acute abnormalities. IMPRESSION: Small effusions and underlying atelectasis. Electronically Signed   By: Gerome Sam III M.D   On: 03/04/2016 11:29  Ct Abdomen Pelvis W Contrast  Result Date: 03/04/2016 CLINICAL DATA:  82 year old female with abdominal pain and distention and severe constipation. EXAM: CT ABDOMEN AND PELVIS WITH CONTRAST TECHNIQUE: Multidetector CT imaging of the abdomen and pelvis was performed using the standard protocol following bolus administration of  intravenous contrast. CONTRAST:  80mL ISOVUE-300 IOPAMIDOL (ISOVUE-300) INJECTION 61% COMPARISON:  03/04/2016 FINDINGS: Lower chest: Mild cardiomegaly noted. A moderate hiatal hernia is noted. There is filling defect within this hernia which could represent food but difficult to exclude a mass. Very small bilateral pleural effusions and mild bibasilar atelectasis noted. Hepatobiliary: The liver and gallbladder are unremarkable. There is no evidence of biliary dilatation. Pancreas: Atrophic without other significant abnormality. Spleen: Unremarkable Adrenals/Urinary Tract: Bilateral renal atrophy identified. There is no evidence of hydronephrosis. The adrenal glands and bladder are unremarkable. Stomach/Bowel: A very large amount of stool is noted within the cecum, ascending colon, transverse colon and proximal descending colon. Gas and fluid within the cecum is also identified which measures up to 9.5 cm in greatest diameter. There is a relative transition point in the mid descending colon without definite colonic abnormality. No wall thickening is identified. There is no evidence of small bowel dilatation. Vascular/Lymphatic: Aortic atherosclerotic calcifications noted without aneurysm. No enlarged lymph nodes are identified. Reproductive: The patient is status post hysterectomy. No adnexal masses identified. Other: No ascites, pneumoperitoneum or abscess. Musculoskeletal: Degenerative changes within the lumbar spine identified. 25% superior endplate compression fracture of L1 is age indeterminate. Mild bony retropulsion noted. IMPRESSION: Distended cecum, ascending colon, transverse colon and proximal descending colon filled with large amount of stool. Relative transition point in the mid descending colon without definite colonic abnormality. Consider direct inspection. No evidence of bowel wall thickening, small bowel obstruction or pneumoperitoneum. Moderate hiatal hernia with filling defect, which could  represent feet but a mass is not entirely excluded. Consider direct inspection as indicated. 25% superior endplate compression fracture of L1- indeterminate. Very small bilateral pleural effusions with bibasilar atelectasis. Cardiomegaly. Electronically Signed   By: Harmon Pier M.D.   On: 03/04/2016 16:36  Dg Abd 2 Views  Result Date: 03/06/2016 CLINICAL DATA:  82 year old female with obstipation. Subsequent encounter. EXAM: ABDOMEN - 2 VIEW COMPARISON:  03/05/2016 plain film exam.  03/04/2016 FINDINGS: Nasogastric tube is in place. Tip projects over the left upper quadrant. Side hole just below the diaphragm level. Patient has a hiatal hernia as noted on recent CT. Gas distended colon. Dilated cecum difficult adequately measure as there may be a superior medial displacement of the cecum. Using similar landmarks to recent plain film examination. Dilated proximal right colon/cecum now spanning over 19.2 cm versus prior 14.3 cm. Progressive distention of gas distended transverse colon measuring up to 8.2 cm versus prior 6.7 cm. Cannot exclude obstructing lesion.  Please see recent CT report. No free intraperitoneal air noted on decubitus view. IMPRESSION: Progressive distension  of the right colon/cecum and transverse colon as noted above. Electronically Signed   By: Lacy Duverney M.D.   On: 03/06/2016 08:00  Dg Abd 2 Views  Result Date: 03/05/2016 CLINICAL DATA:  Obstipation EXAM: ABDOMEN - 2 VIEW COMPARISON:  CT 03/04/2016 FINDINGS: Diffuse gaseous distention of the colon, most pronounced involving the cecum. Cecum measures approximately 14 cm in diameter. When the cecal diameter was measured on the scout image on prior study, this measured 13 cm. No free air organomegaly. IMPRESSION: Marked gaseous distention of the colon, particularly cecum. This is similar to prior CT. Electronically Signed   By: Charlett Nose M.D.   On: 03/05/2016 09:05  Dg Abd 2 Views  Result Date: 03/04/2016 CLINICAL DATA:   Leukocytosis and constipation EXAM: ABDOMEN - 2 VIEW COMPARISON:  None. FINDINGS: No free air, portal venous gas, or pneumatosis. Small effusions in the lung bases. Markedly dilated colon primarily in the right lower abdomen with air-fluid levels on the upright view. The most dilated loop measures up to 11.7 cm increasing the risk of perforation. The more distal loops of colon are relatively decompressed but air does extend into the rectum. No pneumatosis. No other acute abnormalities. IMPRESSION: There is a markedly dilated loop of colon in the right side of the abdomen. A sigmoid volvulus is not excluded. Recommend a CT scan for further evaluation. Findings being called to Dr. Maisie Fus. Electronically Signed   By: Gerome Sam III M.D   On: 03/04/2016 11:39  Dg Basil Dess Tube Plc W/fl W/rad  Result Date: 03/05/2016 CLINICAL DATA:  Colonic distention. Request for nasogastric tube placement. Difficult to place a nasogastric tube on the floor. EXAM: NASO G TUBE PLACEMENT WITH FL AND WITH RAD CONTRAST:  28 mL of Gastrografin injected through nasogastric tube FLUOROSCOPY TIME:  If the device does not provide the exposure index: Fluoroscopy Time (in minutes and seconds):  1 minutes and 6 seconds Number of Acquired Images:  2 COMPARISON:  Abdominal CT 03/04/2016 FINDINGS: Both nostrils anesthetized with viscous lidocaine. 14 French nasogastric tube would not easily advance through the right nostril. The tube easily advanced through the left nostril. Initially, the tube appeared to advance into the airway. The tube was pulled back and directed into the esophagus. Tube was advanced into the upper abdomen. Contrast injection confirmed placement in the stomach. IMPRESSION: Successful placement of a nasogastric tube with fluoroscopy. Catheter tip in the stomach. Electronically Signed   By: Richarda Overlie M.D.   On: 03/05/2016 14:36       Scheduled Meds: . amLODipine  5 mg Oral Daily  . enoxaparin (LOVENOX) injection   40 mg Subcutaneous QHS  . latanoprost  1 drop Left Eye QHS  . levofloxacin (LEVAQUIN) IV  750 mg Intravenous Q24H  . lidocaine  15 mL Mouth/Throat Once  . magnesium sulfate 1 - 4 g bolus IVPB  3 g Intravenous Once  . pantoprazole  40 mg Oral Daily  . polyethylene glycol  17 g Per NG tube Q2H while awake   Continuous Infusions: . sodium chloride 0.9 % 1,000 mL with potassium chloride 40 mEq infusion 75 mL/hr at 03/05/16 2258     LOS: 3 days    Time spent: 35 mins    Verdis Bassette, MD [facility] Pager [phone]  If 7PM-7AM, please contact night-coverage [email or web address] Password [email or web address] 03/06/2016, 9:05 AM

## 2016-03-07 ENCOUNTER — Inpatient Hospital Stay (HOSPITAL_COMMUNITY): Payer: Medicare Other

## 2016-03-07 LAB — BASIC METABOLIC PANEL
Anion gap: 9 (ref 5–15)
BUN: 15 mg/dL (ref 6–20)
CALCIUM: 9.8 mg/dL (ref 8.9–10.3)
CHLORIDE: 100 mmol/L — AB (ref 101–111)
CO2: 24 mmol/L (ref 22–32)
Creatinine, Ser: 0.72 mg/dL (ref 0.44–1.00)
GFR calc Af Amer: >60 mL/min (ref >60)
Glucose, Bld: 66 mg/dL (ref 65–99)
Potassium: 3.8 mmol/L (ref 3.5–5.1)
SODIUM: 133 mmol/L — AB (ref 135–145)

## 2016-03-07 LAB — CBC
HCT: 29.6 % — ABNORMAL LOW (ref 36.0–46.0)
HEMOGLOBIN: 9.8 g/dL — AB (ref 12.0–15.0)
MCH: 29.3 pg (ref 26.0–34.0)
MCHC: 33.1 g/dL (ref 30.0–36.0)
MCV: 88.6 fL (ref 78.0–100.0)
Platelets: 373 10*3/uL (ref 150–400)
RBC: 3.34 MIL/uL — AB (ref 3.87–5.11)
RDW: 13.1 % (ref 11.5–15.5)
WBC: 9.9 10*3/uL (ref 4.0–10.5)

## 2016-03-07 LAB — MAGNESIUM: MAGNESIUM: 1.6 mg/dL — AB (ref 1.7–2.4)

## 2016-03-07 MED ORDER — POTASSIUM CHLORIDE CRYS ER 20 MEQ PO TBCR
40.0000 meq | EXTENDED_RELEASE_TABLET | Freq: Once | ORAL | Status: AC
  Administered 2016-03-07: 40 meq via ORAL
  Filled 2016-03-07: qty 2

## 2016-03-07 MED ORDER — SODIUM CHLORIDE 0.9 % IV SOLN
INTRAVENOUS | Status: DC
  Administered 2016-03-07 – 2016-03-08 (×2): via INTRAVENOUS

## 2016-03-07 MED ORDER — MAGNESIUM SULFATE 4 GM/100ML IV SOLN
4.0000 g | Freq: Once | INTRAVENOUS | Status: AC
  Administered 2016-03-07: 4 g via INTRAVENOUS
  Filled 2016-03-07: qty 100

## 2016-03-07 MED ORDER — POLYETHYLENE GLYCOL 3350 17 G PO PACK
17.0000 g | PACK | Freq: Two times a day (BID) | ORAL | Status: DC
  Administered 2016-03-10: 17 g via NASOGASTRIC
  Filled 2016-03-07 (×4): qty 1

## 2016-03-07 NOTE — Care Management Important Message (Signed)
Important Message  Patient Details  Name: Tara Frederick MRN: [id number] Date of Birth: 25-Aug-1923   Medicare Important Message Given:  Yes    [name] 03/07/2016, 9:37 AM

## 2016-03-07 NOTE — Progress Notes (Signed)
Subjective: Patient having bowel movements. Passing flatus. Abdominal discomfort improved.   Objective: Vital signs in last 24 hours: Temp:  [97.7 F (36.5 C)-98.5 F (36.9 C)] 98.4 F (36.9 C) (08/01 0438) Pulse Rate:  [105-116] 105 (08/01 0438) Resp:  [18-20] 18 (08/01 0438) BP: (129-149)/(69-74) 129/69 (08/01 0438) SpO2:  [93 %-97 %] 97 % (08/01 0438) Last BM Date: 03/06/16  Intake/Output from previous day: 07/31 0701 - 08/01 0700 In: 1827.5 [I.V.:1727.5; IV Piggyback:100] Out: 950 [Urine:850; Emesis/NG output:100] Intake/Output this shift: No intake/output data recorded.   General appearance  Supine. Awake and talkative.  Head: Normocephalic/atraumatic. PERRL bilaterally. Extraocular eye muscles in tact.  Neck: FROM Cardiovascular: Regular rate and rhythm without gallops, murmurs or rubs Respiratory: Lung sounds vesicular. No wheezes, crackles or rhonchi auscultated Abdomen: Distension improved. No rigidity. No tenderness to light palpation. No guarding. Hypoactive BS.   Lab Results:    Recent Labs  03/06/16 0621 03/07/16 0602  WBC 16.4* 9.9  HGB 11.4* 9.8*  HCT 35.4* 29.6*  PLT 417* 373   BMET  Recent Labs  03/06/16 0621 03/07/16 0602  NA 135 133*  K 4.3 3.8  CL 103 100*  CO2 21* 24  GLUCOSE 89 66  BUN 26* 15  CREATININE 0.87 0.72  CALCIUM 10.6* 9.8   PT/INR No results for input(s): LABPROT, INR in the last 72 hours. ABG No results for input(s): PHART, HCO3 in the last 72 hours.  Invalid input(s): PCO2, PO2  Studies/Results: Dg Abd 2 Views  Result Date: 03/07/2016 CLINICAL DATA:  Abdominal discomfort EXAM: ABDOMEN - 2 VIEW COMPARISON:  03/06/2016 FINDINGS: Nasogastric catheter is again seen and stable. Scattered large and small bowel gas is noted. Persistent dilatation is noted in the colon on the right although the degree of dilatation has improved significantly now 10.7 cm in the right mid abdomen. No free air is seen. No bony abnormality  is noted. IMPRESSION: Mild persistent right colonic dilatation although significantly improved from the prior exam. Electronically Signed   By: Alcide Clever M.D.   On: 03/07/2016 08:02   Dg Abd 2 Views  Result Date: 03/06/2016 CLINICAL DATA:  82 year old female with obstipation. Subsequent encounter. EXAM: ABDOMEN - 2 VIEW COMPARISON:  03/05/2016 plain film exam.  03/04/2016 FINDINGS: Nasogastric tube is in place. Tip projects over the left upper quadrant. Side hole just below the diaphragm level. Patient has a hiatal hernia as noted on recent CT. Gas distended colon. Dilated cecum difficult adequately measure as there may be a superior medial displacement of the cecum. Using similar landmarks to recent plain film examination. Dilated proximal right colon/cecum now spanning over 19.2 cm versus prior 14.3 cm. Progressive distention of gas distended transverse colon measuring up to 8.2 cm versus prior 6.7 cm. Cannot exclude obstructing lesion.  Please see recent CT report. No free intraperitoneal air noted on decubitus view. IMPRESSION: Progressive distension of the right colon/cecum and transverse colon as noted above. Electronically Signed   By: Lacy Duverney M.D.   On: 03/06/2016 08:00  Dg Abd 2 Views  Result Date: 03/05/2016 CLINICAL DATA:  Obstipation EXAM: ABDOMEN - 2 VIEW COMPARISON:  CT 03/04/2016 FINDINGS: Diffuse gaseous distention of the colon, most pronounced involving the cecum. Cecum measures approximately 14 cm in diameter. When the cecal diameter was measured on the scout image on prior study, this measured 13 cm. No free air organomegaly. IMPRESSION: Marked gaseous distention of the colon, particularly cecum. This is similar to prior CT. Electronically Signed   By:  Charlett Nose M.D.   On: 03/05/2016 09:05  Dg Vangie Bicker G Tube Plc W/fl W/rad  Result Date: 03/05/2016 CLINICAL DATA:  Colonic distention. Request for nasogastric tube placement. Difficult to place a nasogastric tube on the floor.  EXAM: NASO G TUBE PLACEMENT WITH FL AND WITH RAD CONTRAST:  28 mL of Gastrografin injected through nasogastric tube FLUOROSCOPY TIME:  If the device does not provide the exposure index: Fluoroscopy Time (in minutes and seconds):  1 minutes and 6 seconds Number of Acquired Images:  2 COMPARISON:  Abdominal CT 03/04/2016 FINDINGS: Both nostrils anesthetized with viscous lidocaine. 14 French nasogastric tube would not easily advance through the right nostril. The tube easily advanced through the left nostril. Initially, the tube appeared to advance into the airway. The tube was pulled back and directed into the esophagus. Tube was advanced into the upper abdomen. Contrast injection confirmed placement in the stomach. IMPRESSION: Successful placement of a nasogastric tube with fluoroscopy. Catheter tip in the stomach. Electronically Signed   By: Richarda Overlie M.D.   On: 03/05/2016 14:36   Anti-infectives: Anti-infectives    Start     Dose/Rate Route Frequency Ordered Stop   03/08/16 0800  levofloxacin (LEVAQUIN) IVPB 750 mg     750 mg 100 mL/hr over 90 Minutes Intravenous Every 48 hours 03/06/16 1029     03/04/16 0800  levofloxacin (LEVAQUIN) IVPB 750 mg  Status:  Discontinued     750 mg 100 mL/hr over 90 Minutes Intravenous Every 24 hours 03/03/16 2221 03/06/16 1029      Assessment/Plan: Constipation/Obstipation  Two bowel movements yesterday. Abdominal distension improving. Expect continued clinical improvement. We are signing off from here.   UTI- Will be treated with Levaquin   FEN: NPO VTE: Lovenox  ID: [email or web address]  Dispo: Further clinical improvement    Orvil Feil PASII 03/07/2016

## 2016-03-07 NOTE — Progress Notes (Signed)
Tara Frederick 11:18 AM  Subjective: Patient doing much better and has had multiple bowel movements and her case discussed with the primary team and her daughter and no new complaints  Objective: Vital signs stable afebrile no acute distress abdomen is soft occasional bowel sounds nontender labs stable slight decrease hemoglobin  Assessment: Improved colonic obstruction  Plan: Clamp NG allow clear liquids continue lower dose MiraLAX and probably proceed with a Gastrografin enema tomorrow just to be sure that there is no obvious mass  [facility]  Pager [phone] After 5PM or if no answer call [phone]

## 2016-03-07 NOTE — Progress Notes (Signed)
PROGRESS NOTE    Tara Frederick  UJW:[id number] DOB: 12-06-1923 DOA: 03/03/2016 PCP: No primary care provider on file.    Brief Narrative:  Tara Frederick is a 82 y.o. female with medical history significant of HTN, GERD, degenerative disk disease.  Patient given narcotics in ED last week for back pain.  Since then she has had severe constipation.  Took narcotic last on Sunday.  No BM since then.  Presented to [facility] 3 days ago.  Was admitted.  CT scan at that time showed severe constipation.  Attempts made to treat medically include: 2L golytely, miralax BID, dulcolax 20mg  PO daily, senna, multiple fleet enemas, and 2 attempts at manual disimpaction (no stool in rectum).  All of the above attempts to treat medically have failed, and today patient finally developed nausea and ultimately numerous episodes of vomiting on ambulance ride down here.  Patient was admitted given oral MiraLAX and some enemas with no significant improvement. Abdominal films showed worsening cecal dilatation. GI was consulted. A G-tube placed on the fluoroscopy. Patient given every 2 hour MiraLAX and tap water enemas. Patient finally with multiple bowel movements. Patient for Gastrografin enema tomorrow 03/08/2016.  Assessment & Plan:   Principal Problem:   Obstipation Active Problems:   UTI (lower urinary tract infection)   Benign essential HTN   GERD (gastroesophageal reflux disease)   Leukocytosis  #1 obstipation Patient admitted with concerns for obstipation. Patient's last bowel movement was July 12 of family after receiving narcotic pain medications for back injury. Patient with worsening abdominal distention and inability to have bowel movements despite multiple laxatives and multiple Fleet enemas as well as an attempt at manual disimpaction with no stool noted in the rectal vault the outside hospital.  Patient with some nausea during the hospitalization. Plain films of the abdomen with a markedly dilated loop of  colon in the right side of the abdomen measuring approximately 11.7 cm. Increasing risk for perforation. Concern for possible sigmoid volvulus. CT abdomen and pelvis done 03/04/2016 with a distended cecum measuring 9.5 cm, ascending colon transverse colon and proximal descending colon filled with large amount of stool. 25% superior endplate compression fracture of L1 indeterminate.  Multiple attempts at NG tube placement the night of 03/04/2016 was unsuccessful. Patient also had multiple tap water enemas were no results. NG tube placed under fluoroscopy per radiology 03/05/2016. NG tube to suction.  Patient with multiple bowel movements overnight and this morning while on every 2 hours MiraLAX with improvement with abdominal distention and abdominal pain. Abdominal x-rays with improvement. Significant clinical improvement. NG tube has been clamped and patient started on clear liquids which she is tolerating per GI. Will change every 2 hour MiraLAX to MiraLAX twice a day. Monitor on clears. Patient for probable Gastrografin enema tomorrow to rule out an obvious mass per GI.  GI following and appreciate input and recommendations. Continue bowel rest. IV fluids. Keep magnesium greater than 2. Keep potassium close to 5.   #2 probable UTI Urine cultures with multiple morphotypes. Continue IV Levaquin and treat for total of 5-7 days.  #3 leukocytosis Likely secondary to problem #2 and reactive leukocytosis. WBC trending back down. Urine cultures with multiple morphotypes. Continue empiric IV Levaquin.  #4 gastroesophageal reflux disease Continue IV PPI and transition to oral PPI when tolerating oral diet.   #5 hypertension Continue Norvasc.   DVT prophylaxis: Lovenox Code Status: Full Family Communication: Updated patient. No family at bedside. Disposition Plan: Home once acute medical issues have resolved and  patient is stable and tolerating oral intake with good bowel movements.   Consultants:     Gastroenterology: Dr. Madilyn Fireman 03/04/2016  Gen. surgery: Dr. Andrey Campanile 03/04/2016  Procedures:   CXR 03/04/2016  KUB 03/04/2016  CT abdomen and pelvis 03/04/2016  NG tube placement under fluoroscopy 03/05/2016  Antimicrobials:   IV Levaquin 03/04/2016   Subjective: Patient with multiple bowel movements yesterday overnight and this morning. Patient passing flatus. Patient with significant improvement with abdominal pain and distention. Patient denies any chest pain. No shortness of breath.  Objective: Vitals:   03/06/16 1257 03/06/16 2119 03/07/16 0438 03/07/16 0825  BP: 139/71 (!) 149/74 129/69 (!) 137/57  Pulse: (!) 106 (!) 116 (!) 105   Resp: 20 18 18    Temp: 97.7 F (36.5 C) 98.5 F (36.9 C) 98.4 F (36.9 C)   TempSrc: Oral Oral Oral   SpO2: 93% 93% 97%   Weight:      Height:        Intake/Output Summary (Last 24 hours) at 03/07/16 1315 Last data filed at 03/07/16 1216  Gross per 24 hour  Intake           1827.5 ml  Output              550 ml  Net           1277.5 ml   Filed Weights   03/03/16 2048  Weight: 58.9 kg (129 lb 13.6 oz)    Examination:  General exam: Appears calm and comfortable.NG tube clamped Respiratory system: Clear to auscultation anterior lung fields. Cardiovascular system: S1 & S2 heard, RRR. No JVD, murmurs, rubs, gallops or clicks. No pedal edema. Gastrointestinal system: Abdomen is soft, significantly less distended, less TTP in RLQ > rest of abdomen. No organomegaly or masses felt. Positive bowel sounds. Central nervous system: Alert and oriented. No focal neurological deficits. Extremities: Symmetric 5 x 5 power. Skin: No rashes, lesions or ulcers Psychiatry: Judgement and insight appear normal. Mood & affect appropriate.     Data Reviewed: I have personally reviewed following labs and imaging studies  CBC:  Recent Labs Lab 03/04/16 0502 03/05/16 0759 03/06/16 0621 03/07/16 0602  WBC 15.4* 13.2* 16.4* 9.9  NEUTROABS  --   9.5*  --   --   HGB 11.9* 11.0* 11.4* 9.8*  HCT 36.1 33.5* 35.4* 29.6*  MCV 87.6 88.6 89.2 88.6  PLT 381 378 417* 373   Basic Metabolic Panel:  Recent Labs Lab 03/04/16 0502 03/05/16 0532 03/05/16 0759 03/06/16 0621 03/07/16 0602  NA 132*  --  134* 135 133*  K 3.3*  --  4.0 4.3 3.8  CL 96*  --  102 103 100*  CO2 22  --  24 21* 24  GLUCOSE 104*  --  99 89 66  BUN 22*  --  26* 26* 15  CREATININE 0.97  --  0.84 0.87 0.72  CALCIUM 10.6*  --  10.1 10.6* 9.8  MG 1.5* 2.1  --  1.7 1.6*   GFR: Estimated Creatinine Clearance: 37.7 mL/min (by C-G formula based on SCr of 0.8 mg/dL). Liver Function Tests: No results for input(s): AST, ALT, ALKPHOS, BILITOT, PROT, ALBUMIN in the last 168 hours. No results for input(s): LIPASE, AMYLASE in the last 168 hours. No results for input(s): AMMONIA in the last 168 hours. Coagulation Profile: No results for input(s): INR, PROTIME in the last 168 hours. Cardiac Enzymes: No results for input(s): CKTOTAL, CKMB, CKMBINDEX, TROPONINI in the last 168 hours. BNP (  last 3 results) No results for input(s): PROBNP in the last 8760 hours. HbA1C: No results for input(s): HGBA1C in the last 72 hours. CBG: No results for input(s): GLUCAP in the last 168 hours. Lipid Profile: No results for input(s): CHOL, HDL, LDLCALC, TRIG, CHOLHDL, LDLDIRECT in the last 72 hours. Thyroid Function Tests: No results for input(s): TSH, T4TOTAL, FREET4, T3FREE, THYROIDAB in the last 72 hours. Anemia Panel: No results for input(s): VITAMINB12, FOLATE, FERRITIN, TIBC, IRON, RETICCTPCT in the last 72 hours. Sepsis Labs: No results for input(s): PROCALCITON, LATICACIDVEN in the last 168 hours.  Recent Results (from the past 240 hour(s))  Urine culture     Status: Abnormal   Collection Time: 03/04/16  6:07 PM  Result Value Ref Range Status   Specimen Description URINE, RANDOM  Final   Special Requests NONE  Final   Culture MULTIPLE SPECIES PRESENT, SUGGEST RECOLLECTION  (A)  Final   Report Status 03/05/2016 FINAL  Final         Radiology Studies: Dg Abd 2 Views  Result Date: 03/07/2016 CLINICAL DATA:  Abdominal discomfort EXAM: ABDOMEN - 2 VIEW COMPARISON:  03/06/2016 FINDINGS: Nasogastric catheter is again seen and stable. Scattered large and small bowel gas is noted. Persistent dilatation is noted in the colon on the right although the degree of dilatation has improved significantly now 10.7 cm in the right mid abdomen. No free air is seen. No bony abnormality is noted. IMPRESSION: Mild persistent right colonic dilatation although significantly improved from the prior exam. Electronically Signed   By: Alcide Clever M.D.   On: 03/07/2016 08:02   Dg Abd 2 Views  Result Date: 03/06/2016 CLINICAL DATA:  82 year old female with obstipation. Subsequent encounter. EXAM: ABDOMEN - 2 VIEW COMPARISON:  03/05/2016 plain film exam.  03/04/2016 FINDINGS: Nasogastric tube is in place. Tip projects over the left upper quadrant. Side hole just below the diaphragm level. Patient has a hiatal hernia as noted on recent CT. Gas distended colon. Dilated cecum difficult adequately measure as there may be a superior medial displacement of the cecum. Using similar landmarks to recent plain film examination. Dilated proximal right colon/cecum now spanning over 19.2 cm versus prior 14.3 cm. Progressive distention of gas distended transverse colon measuring up to 8.2 cm versus prior 6.7 cm. Cannot exclude obstructing lesion.  Please see recent CT report. No free intraperitoneal air noted on decubitus view. IMPRESSION: Progressive distension of the right colon/cecum and transverse colon as noted above. Electronically Signed   By: Lacy Duverney M.D.   On: 03/06/2016 08:00  Dg Naso G Tube Plc W/fl W/rad  Result Date: 03/05/2016 CLINICAL DATA:  Colonic distention. Request for nasogastric tube placement. Difficult to place a nasogastric tube on the floor. EXAM: NASO G TUBE PLACEMENT WITH FL AND  WITH RAD CONTRAST:  28 mL of Gastrografin injected through nasogastric tube FLUOROSCOPY TIME:  If the device does not provide the exposure index: Fluoroscopy Time (in minutes and seconds):  1 minutes and 6 seconds Number of Acquired Images:  2 COMPARISON:  Abdominal CT 03/04/2016 FINDINGS: Both nostrils anesthetized with viscous lidocaine. 14 French nasogastric tube would not easily advance through the right nostril. The tube easily advanced through the left nostril. Initially, the tube appeared to advance into the airway. The tube was pulled back and directed into the esophagus. Tube was advanced into the upper abdomen. Contrast injection confirmed placement in the stomach. IMPRESSION: Successful placement of a nasogastric tube with fluoroscopy. Catheter tip in the stomach. Electronically  Signed   By: Richarda Overlie M.D.   On: 03/05/2016 14:36       Scheduled Meds: . amLODipine  5 mg Oral Daily  . enoxaparin (LOVENOX) injection  40 mg Subcutaneous QHS  . famotidine (PEPCID) IV  20 mg Intravenous Q12H  . latanoprost  1 drop Left Eye QHS  . [START ON 03/08/2016] levofloxacin (LEVAQUIN) IV  750 mg Intravenous Q48H  . lidocaine  15 mL Mouth/Throat Once  . polyethylene glycol  17 g Per NG tube BID   Continuous Infusions: . sodium chloride 0.9 % 1,000 mL with potassium chloride 40 mEq infusion 75 mL/hr at 03/07/16 0834     LOS: 4 days    Time spent: 35 mins    Jerrit Horen, MD [facility] Pager [phone]  If 7PM-7AM, please contact night-coverage [email or web address] Password [email or web address] 03/07/2016, 1:15 PM

## 2016-03-08 ENCOUNTER — Inpatient Hospital Stay (HOSPITAL_COMMUNITY): Payer: Medicare Other

## 2016-03-08 DIAGNOSIS — D72829 Elevated white blood cell count, unspecified: Secondary | ICD-10-CM

## 2016-03-08 DIAGNOSIS — K5900 Constipation, unspecified: Secondary | ICD-10-CM

## 2016-03-08 DIAGNOSIS — I10 Essential (primary) hypertension: Secondary | ICD-10-CM

## 2016-03-08 DIAGNOSIS — K219 Gastro-esophageal reflux disease without esophagitis: Secondary | ICD-10-CM

## 2016-03-08 DIAGNOSIS — N390 Urinary tract infection, site not specified: Secondary | ICD-10-CM

## 2016-03-08 LAB — BASIC METABOLIC PANEL
ANION GAP: 12 (ref 5–15)
BUN: 8 mg/dL (ref 6–20)
CALCIUM: 9.8 mg/dL (ref 8.9–10.3)
CHLORIDE: 96 mmol/L — AB (ref 101–111)
CO2: 23 mmol/L (ref 22–32)
Creatinine, Ser: 0.74 mg/dL (ref 0.44–1.00)
GFR calc non Af Amer: >60 mL/min (ref >60)
Glucose, Bld: 98 mg/dL (ref 65–99)
Potassium: 3.5 mmol/L (ref 3.5–5.1)
SODIUM: 131 mmol/L — AB (ref 135–145)

## 2016-03-08 LAB — CBC
HCT: 32.7 % — ABNORMAL LOW (ref 36.0–46.0)
HEMOGLOBIN: 11 g/dL — AB (ref 12.0–15.0)
MCH: 29.6 pg (ref 26.0–34.0)
MCHC: 33.6 g/dL (ref 30.0–36.0)
MCV: 88.1 fL (ref 78.0–100.0)
PLATELETS: 338 10*3/uL (ref 150–400)
RBC: 3.71 MIL/uL — AB (ref 3.87–5.11)
RDW: 12.8 % (ref 11.5–15.5)
WBC: 9.9 10*3/uL (ref 4.0–10.5)

## 2016-03-08 LAB — MAGNESIUM: Magnesium: 1.5 mg/dL — ABNORMAL LOW (ref 1.7–2.4)

## 2016-03-08 MED ORDER — DIATRIZOATE MEGLUMINE & SODIUM 66-10 % PO SOLN
270.0000 mL | Freq: Once | ORAL | Status: DC
  Administered 2016-03-08: 270 mL via ORAL

## 2016-03-08 MED ORDER — DIATRIZOATE MEGLUMINE & SODIUM 66-10 % PO SOLN
ORAL | Status: AC
  Filled 2016-03-08: qty 270

## 2016-03-08 MED ORDER — WITCH HAZEL-GLYCERIN EX PADS
MEDICATED_PAD | CUTANEOUS | Status: DC | PRN
  Filled 2016-03-08: qty 100

## 2016-03-08 NOTE — Progress Notes (Signed)
Occupational Therapy Evaluation Patient Details Name: Tara Frederick MRN: [id number] DOB: 1924-06-21 Today's Date: 03/08/2016    History of Present Illness 82 y.o. female with medical history significant of HTN, GERD, degenerative disk disease.  Patient given narcotics in ED last week for back pain.. Presented to [facility] Patient was admitted given oral MiraLAX and some enemas with no significant improvement. Abdominal films showed worsening cecal dilatation. GI was consulted. A G-tube placed on the fluoroscopy. Patient given every 2 hour MiraLAX and tap water enemas. Patient finally with multiple bowel movements. Patient for Gastrografin enema 8/2.    Clinical Impression   PTA, pt lived alone and was independent with ADL and mobility and very active. Pt currently requires min A with mobility and ADL @ RW level.  Will follow acutely and recommend D/C home with [facility] services and initial 24/7 S. Family states they can S pt at D/C.     Follow Up Recommendations  Home health OT;Supervision/Assistance - 24 hour (initially)    Equipment Recommendations  3 in 1 bedside comode    Recommendations for Other Services       Precautions / Restrictions Precautions Precautions: Fall      Mobility Bed Mobility Overal bed mobility: Modified Independent                Transfers Overall transfer level: Needs assistance   Transfers: Sit to/from Stand Sit to Stand: Supervision         General transfer comment: cues for hand placement and safe use of RW    Balance Overall balance assessment: Needs assistance           Standing balance-Leahy Scale: Fair                              ADL Overall ADL's : Needs assistance/impaired Eating/Feeding:  (clear liquids)   Grooming: Set up;Supervision/safety   Upper Body Bathing: Set up;Sitting   Lower Body Bathing: Supervison/ safety;Set up;Sit to/from stand   Upper Body Dressing : Supervision/safety;Set up;Sitting   Lower  Body Dressing: Min guard;Sit to/from stand   Toilet Transfer: Adult nurse- Architect and Hygiene: Min guard;Sit to/from stand       Functional mobility during ADLs: Min guard;Rolling walker General ADL Comments: Easily fatigues. Using RW     Vision     Perception     Praxis      Pertinent Vitals/Pain Pain Assessment: Faces Faces Pain Scale: Hurts little more Pain Location: back Pain Descriptors / Indicators: Aching;Grimacing Pain Intervention(s): Limited activity within patient's tolerance;Heat applied     Hand Dominance Right   Extremity/Trunk Assessment Upper Extremity Assessment Upper Extremity Assessment: Generalized weakness   Lower Extremity Assessment Lower Extremity Assessment: Generalized weakness   Cervical / Trunk Assessment Cervical / Trunk Assessment: Other exceptions (new onset of back pain; hx of back problems)   Communication Communication Communication: No difficulties   Cognition Arousal/Alertness: Awake/alert Behavior During Therapy: WFL for tasks assessed/performed Overall Cognitive Status: Within Functional Limits for tasks assessed                     General Comments       Exercises       Shoulder Instructions      Home Living Family/patient expects to be discharged to:: Private residence Living Arrangements: Alone Available Help at Discharge: Family;Available 24 hours/day Type of Home: House Home Access: Stairs to enter Entrance  Stairs-Number of Steps: 1   Home Layout: Two level;Able to live on main level with bedroom/bathroom     Bathroom Shower/Tub: Producer, television/film/video: Handicapped height Bathroom Accessibility: Yes How Accessible: Accessible via walker Home Equipment: Walker - 2 wheels;Cane - single point;Shower seat;Grab bars - tub/shower          Prior Functioning/Environment Level of Independence: Independent        Comments: still  drives; enjoys reading    OT Diagnosis: Generalized weakness   OT Problem List: Decreased strength;Decreased activity tolerance;Impaired balance (sitting and/or standing);Decreased safety awareness;Decreased knowledge of use of DME or AE;Pain   OT Treatment/Interventions: Self-care/ADL training;Therapeutic exercise;Energy conservation;DME and/or AE instruction;Therapeutic activities;Patient/family education;Balance training    OT Goals(Current goals can be found in the care plan section) Acute Rehab OT Goals Patient Stated Goal: to return to PLOF OT Goal Formulation: With patient Time For Goal Achievement: 03/22/16 Potential to Achieve Goals: Good ADL Goals Pt Will Perform Lower Body Bathing: sit to/from stand;with set-up Pt Will Perform Lower Body Dressing: with set-up;sit to/from stand Pt Will Transfer to Toilet: with supervision;ambulating Pt Will Perform Toileting - Clothing Manipulation and hygiene: with modified independence;sit to/from stand Additional ADL Goal #1: Pt will independently  verbalize 3 energy conservation techiques for ADL  OT Frequency: Min 2X/week   Barriers to D/C:            Co-evaluation              End of Session Equipment Utilized During Treatment: Gait belt;Rolling walker Nurse Communication: Mobility status  Activity Tolerance: Patient tolerated treatment well Patient left: in bed;with call bell/phone within reach;with bed alarm set;with family/visitor present   Time: 9604-5409 OT Time Calculation (min): 30 min Charges:  OT General Charges $OT Visit: 1 Procedure OT Evaluation $OT Eval Moderate Complexity: 1 Procedure OT Treatments $Self Care/Home Management : 8-22 mins G-Codes:    Jaylenn Altier,HILLARY 2016-04-07, 5:42 PM   [facility], OTR/L  [phone] 07-Apr-2016

## 2016-03-08 NOTE — Care Management Important Message (Signed)
Important Message  Patient Details  Name: Tara Frederick MRN: [id number] Date of Birth: Jun 03, 1924   Medicare Important Message Given:  Yes    [name] 03/08/2016, 10:57 AM

## 2016-03-08 NOTE — Progress Notes (Signed)
[facility]                                                                              Patient Demographics  Tara Frederick, is a 82 y.o. female, DOB - 06-11-1924, MOQ:[id number]  Admit date - 03/03/2016   Admitting Physician Starleen Arms, MD  Outpatient Primary MD for the patient is No primary care provider on file.  Outpatient specialists:   LOS - 5  days    No chief complaint on file.      Brief summary   Tara Frederick a 82 y.o.femalewith medical history significant of HTN, GERD, degenerative disk disease. Patient given narcotics in ED last week for back pain. Since then she has had severe constipation. Took narcotic last on Sunday. No BM since then. Presented to [facility] days ago. Was admitted. CT scan at that time showed severe constipation. Attempts made to treat medically include: 2L golytely, miralax BID, dulcolax 20mg  PO daily, senna, multiple fleet enemas, and 2 attempts at manual disimpaction (no stool in rectum). All of the above attempts to treat medically have failed, and today patient finally developed nausea and ultimately numerous episodes of vomiting on ambulance ride down here. Patient was admitted given oral MiraLAX and some enemas with no significant improvement. Abdominal films showed worsening cecal dilatation. GI was consulted. A G-tube placed on the fluoroscopy. Patient given every 2 hour MiraLAX and tap water enemas. Patient finally with multiple bowel movements. Patient for Gastrografin enema tomorrow 03/08/2016.    Assessment & Plan    Severe obstipation - Patient's last bowel movement was July 12 after receiving narcotic pain medications for back injury, Presented with worsening abdominal distention and inability to have bowel movements despite multiple laxatives and multiple Fleet enemas as well as an attempt at manual disimpaction with no stool noted in the rectal vault the outside hospital.  -  Plain films of the  abdomen with a markedly dilated loop of colon in the right side of the abdomen measuring approximately 11.7 cm. Increasing risk for perforation. Concern for possible sigmoid volvulus. -  CT abdomen and pelvis done 03/04/2016 with a distended cecum measuring 9.5 cm, ascending colon transverse colon and proximal descending colon filled with large amount of stool. 25% superior endplate compression fracture of L1 indeterminate.  - multiple attempts at NG tube placement the night of 03/04/2016 was unsuccessful. Patient also had multiple tap water enemas were no results. NG tube placed under fluoroscopy per radiology 03/05/2016. NG tube placed to suction.  - Subsequently, patient had multiple BMs on 7/30-8/1 while on every 2 hours MiraLAX with improvement with abdominal distention and abdominal pain. Abdominal x-rays with improvement. Significant clinical improvement. NG tube was clamped - Patient was started on clears, GI has been following closely.  - Gastrografin enema on 8/2 to rule out any near obstructing lesions - NG to be removed however not to advance diet until after the x-ray in case colonoscopies needed.    probable UTI Urine cultures with multiple morphotypes. Continue IV Levaquin and treat for total of 5-7 days.  leukocytosis Likely secondary to problem #2 and reactive leukocytosis. - Currently no  leukocytosis, urine cultures with multiple morphotypes. Continue empiric IV Levaquin.  GERD Continue IV PPI and transition to oral PPI when tolerating oral diet.    hypertension Continue Norvasc.   Code Status: Full  DVT Prophylaxis:  Lovenox  Family Communication: Discussed in detail with the patient, all imaging results, lab results explained to the patient   Disposition Plan:   Time Spent in minutes 25 minutes  Procedures:   CXR 03/04/2016  KUB 03/04/2016  CT abdomen and pelvis 03/04/2016  NG tube placement under fluoroscopy 03/05/2016  Consultants:    Gastroenterology surgery  Antimicrobials:   IV levaquin   Medications  Scheduled Meds: . diatrizoate meglumine-sodium      . amLODipine  5 mg Oral Daily  . enoxaparin (LOVENOX) injection  40 mg Subcutaneous QHS  . famotidine (PEPCID) IV  20 mg Intravenous Q12H  . latanoprost  1 drop Left Eye QHS  . levofloxacin (LEVAQUIN) IV  750 mg Intravenous Q48H  . lidocaine  15 mL Mouth/Throat Once  . polyethylene glycol  17 g Per NG tube BID   Continuous Infusions: . sodium chloride 75 mL/hr at 03/08/16 1201   PRN Meds:.ketorolac, ondansetron (ZOFRAN) IV, phenol, prochlorperazine   Antibiotics   Anti-infectives    Start     Dose/Rate Route Frequency Ordered Stop   03/08/16 0800  levofloxacin (LEVAQUIN) IVPB 750 mg     750 mg 100 mL/hr over 90 Minutes Intravenous Every 48 hours 03/06/16 1029     03/04/16 0800  levofloxacin (LEVAQUIN) IVPB 750 mg  Status:  Discontinued     750 mg 100 mL/hr over 90 Minutes Intravenous Every 24 hours 03/03/16 2221 03/06/16 1029        Subjective:   Tara Frederick was seen and examined today. No vomiting, abdominal pain at this time. Does not like NG tube.  Patient denies dizziness, chest pain, shortness of breath, new weakness, numbess, tingling. No acute events overnight.    Objective:   Vitals:   03/07/16 1337 03/08/16 0549 03/08/16 0614 03/08/16 0820  BP: (!) 146/57 (!) 160/73 (!) 150/75 (!) 149/76  Pulse: 100 (!) 113 (!) 107   Resp: 20 18    Temp: 98.8 F (37.1 C) 98.5 F (36.9 C)    TempSrc: Oral     SpO2: 96% 91% 94%   Weight:      Height:        Intake/Output Summary (Last 24 hours) at 03/08/16 1330 Last data filed at 03/08/16 1215  Gross per 24 hour  Intake              520 ml  Output             1925 ml  Net            -1405 ml     Wt Readings from Last 3 Encounters:  03/03/16 58.9 kg (129 lb 13.6 oz)     Exam  General: Alert and oriented x 3, NAD  HEENT:  PERRLA, EOMI, Anicteric Sclera, mucous membranes  moist.   Neck: Supple, no JVD, no masses  Cardiovascular: S1 S2 auscultated, no rubs, murmurs or gallops. Regular rate and rhythm.  Respiratory: Clear to auscultation bilaterally, no wheezing, rales or rhonchi  Gastrointestinal: Soft, nontender, nondistended, hypoactive bowel sounds  Ext: no cyanosis clubbing or edema  Neuro: AAOx3, Cr N's II- XII. Strength 5/5 upper and lower extremities bilaterally  Skin: No rashes  Psych: Normal affect and demeanor, alert and oriented x3  Data Reviewed:  I have personally reviewed following labs and imaging studies  Micro Results Recent Results (from the past 240 hour(s))  Urine culture     Status: Abnormal   Collection Time: 03/04/16  6:07 PM  Result Value Ref Range Status   Specimen Description URINE, RANDOM  Final   Special Requests NONE  Final   Culture MULTIPLE SPECIES PRESENT, SUGGEST RECOLLECTION (A)  Final   Report Status 03/05/2016 FINAL  Final    Radiology Reports Dg Chest 2 View  Result Date: 03/04/2016 CLINICAL DATA:  Leukocytosis. EXAM: CHEST  2 VIEW COMPARISON:  None. FINDINGS: Small bilateral effusions. The heart, hila, an mediastinum are unremarkable. No pulmonary nodules or masses. No focal infiltrates. Mild pleural thickening laterally in the right chest, likely chronic. No other acute abnormalities. IMPRESSION: Small effusions and underlying atelectasis. Electronically Signed   By: Gerome Sam III M.D   On: 03/04/2016 11:29  Ct Abdomen Pelvis W Contrast  Result Date: 03/04/2016 CLINICAL DATA:  82 year old female with abdominal pain and distention and severe constipation. EXAM: CT ABDOMEN AND PELVIS WITH CONTRAST TECHNIQUE: Multidetector CT imaging of the abdomen and pelvis was performed using the standard protocol following bolus administration of intravenous contrast. CONTRAST:  80mL ISOVUE-300 IOPAMIDOL (ISOVUE-300) INJECTION 61% COMPARISON:  03/04/2016 FINDINGS: Lower chest: Mild cardiomegaly noted. A moderate  hiatal hernia is noted. There is filling defect within this hernia which could represent food but difficult to exclude a mass. Very small bilateral pleural effusions and mild bibasilar atelectasis noted. Hepatobiliary: The liver and gallbladder are unremarkable. There is no evidence of biliary dilatation. Pancreas: Atrophic without other significant abnormality. Spleen: Unremarkable Adrenals/Urinary Tract: Bilateral renal atrophy identified. There is no evidence of hydronephrosis. The adrenal glands and bladder are unremarkable. Stomach/Bowel: A very large amount of stool is noted within the cecum, ascending colon, transverse colon and proximal descending colon. Gas and fluid within the cecum is also identified which measures up to 9.5 cm in greatest diameter. There is a relative transition point in the mid descending colon without definite colonic abnormality. No wall thickening is identified. There is no evidence of small bowel dilatation. Vascular/Lymphatic: Aortic atherosclerotic calcifications noted without aneurysm. No enlarged lymph nodes are identified. Reproductive: The patient is status post hysterectomy. No adnexal masses identified. Other: No ascites, pneumoperitoneum or abscess. Musculoskeletal: Degenerative changes within the lumbar spine identified. 25% superior endplate compression fracture of L1 is age indeterminate. Mild bony retropulsion noted. IMPRESSION: Distended cecum, ascending colon, transverse colon and proximal descending colon filled with large amount of stool. Relative transition point in the mid descending colon without definite colonic abnormality. Consider direct inspection. No evidence of bowel wall thickening, small bowel obstruction or pneumoperitoneum. Moderate hiatal hernia with filling defect, which could represent feet but a mass is not entirely excluded. Consider direct inspection as indicated. 25% superior endplate compression fracture of L1- indeterminate. Very small  bilateral pleural effusions with bibasilar atelectasis. Cardiomegaly. Electronically Signed   By: Harmon Pier M.D.   On: 03/04/2016 16:36  Dg Abd 2 Views  Result Date: 03/07/2016 CLINICAL DATA:  Abdominal discomfort EXAM: ABDOMEN - 2 VIEW COMPARISON:  03/06/2016 FINDINGS: Nasogastric catheter is again seen and stable. Scattered large and small bowel gas is noted. Persistent dilatation is noted in the colon on the right although the degree of dilatation has improved significantly now 10.7 cm in the right mid abdomen. No free air is seen. No bony abnormality is noted. IMPRESSION: Mild persistent right colonic dilatation although significantly improved from  the prior exam. Electronically Signed   By: Alcide Clever M.D.   On: 03/07/2016 08:02   Dg Abd 2 Views  Result Date: 03/06/2016 CLINICAL DATA:  82 year old female with obstipation. Subsequent encounter. EXAM: ABDOMEN - 2 VIEW COMPARISON:  03/05/2016 plain film exam.  03/04/2016 FINDINGS: Nasogastric tube is in place. Tip projects over the left upper quadrant. Side hole just below the diaphragm level. Patient has a hiatal hernia as noted on recent CT. Gas distended colon. Dilated cecum difficult adequately measure as there may be a superior medial displacement of the cecum. Using similar landmarks to recent plain film examination. Dilated proximal right colon/cecum now spanning over 19.2 cm versus prior 14.3 cm. Progressive distention of gas distended transverse colon measuring up to 8.2 cm versus prior 6.7 cm. Cannot exclude obstructing lesion.  Please see recent CT report. No free intraperitoneal air noted on decubitus view. IMPRESSION: Progressive distension of the right colon/cecum and transverse colon as noted above. Electronically Signed   By: Lacy Duverney M.D.   On: 03/06/2016 08:00  Dg Abd 2 Views  Result Date: 03/05/2016 CLINICAL DATA:  Obstipation EXAM: ABDOMEN - 2 VIEW COMPARISON:  CT 03/04/2016 FINDINGS: Diffuse gaseous distention of the colon,  most pronounced involving the cecum. Cecum measures approximately 14 cm in diameter. When the cecal diameter was measured on the scout image on prior study, this measured 13 cm. No free air organomegaly. IMPRESSION: Marked gaseous distention of the colon, particularly cecum. This is similar to prior CT. Electronically Signed   By: Charlett Nose M.D.   On: 03/05/2016 09:05  Dg Abd 2 Views  Result Date: 03/04/2016 CLINICAL DATA:  Leukocytosis and constipation EXAM: ABDOMEN - 2 VIEW COMPARISON:  None. FINDINGS: No free air, portal venous gas, or pneumatosis. Small effusions in the lung bases. Markedly dilated colon primarily in the right lower abdomen with air-fluid levels on the upright view. The most dilated loop measures up to 11.7 cm increasing the risk of perforation. The more distal loops of colon are relatively decompressed but air does extend into the rectum. No pneumatosis. No other acute abnormalities. IMPRESSION: There is a markedly dilated loop of colon in the right side of the abdomen. A sigmoid volvulus is not excluded. Recommend a CT scan for further evaluation. Findings being called to Dr. Maisie Fus. Electronically Signed   By: Gerome Sam III M.D   On: 03/04/2016 11:39  Dg Basil Dess Tube Plc W/fl W/rad  Result Date: 03/05/2016 CLINICAL DATA:  Colonic distention. Request for nasogastric tube placement. Difficult to place a nasogastric tube on the floor. EXAM: NASO G TUBE PLACEMENT WITH FL AND WITH RAD CONTRAST:  28 mL of Gastrografin injected through nasogastric tube FLUOROSCOPY TIME:  If the device does not provide the exposure index: Fluoroscopy Time (in minutes and seconds):  1 minutes and 6 seconds Number of Acquired Images:  2 COMPARISON:  Abdominal CT 03/04/2016 FINDINGS: Both nostrils anesthetized with viscous lidocaine. 14 French nasogastric tube would not easily advance through the right nostril. The tube easily advanced through the left nostril. Initially, the tube appeared to advance  into the airway. The tube was pulled back and directed into the esophagus. Tube was advanced into the upper abdomen. Contrast injection confirmed placement in the stomach. IMPRESSION: Successful placement of a nasogastric tube with fluoroscopy. Catheter tip in the stomach. Electronically Signed   By: Richarda Overlie M.D.   On: 03/05/2016 14:36   Lab Data:  CBC:  Recent Labs Lab 03/04/16 0502 03/05/16  1610 03/06/16 0621 03/07/16 0602 03/08/16 1013  WBC 15.4* 13.2* 16.4* 9.9 9.9  NEUTROABS  --  9.5*  --   --   --   HGB 11.9* 11.0* 11.4* 9.8* 11.0*  HCT 36.1 33.5* 35.4* 29.6* 32.7*  MCV 87.6 88.6 89.2 88.6 88.1  PLT 381 378 417* 373 338   Basic Metabolic Panel:  Recent Labs Lab 03/04/16 0502 03/05/16 0532 03/05/16 0759 03/06/16 0621 03/07/16 0602 03/08/16 0909  NA 132*  --  134* 135 133* 131*  K 3.3*  --  4.0 4.3 3.8 3.5  CL 96*  --  102 103 100* 96*  CO2 22  --  24 21* 24 23  GLUCOSE 104*  --  99 89 66 98  BUN 22*  --  26* 26* 15 8  CREATININE 0.97  --  0.84 0.87 0.72 0.74  CALCIUM 10.6*  --  10.1 10.6* 9.8 9.8  MG 1.5* 2.1  --  1.7 1.6* 1.5*   GFR: Estimated Creatinine Clearance: 37.7 mL/min (by C-G formula based on SCr of 0.8 mg/dL). Liver Function Tests: No results for input(s): AST, ALT, ALKPHOS, BILITOT, PROT, ALBUMIN in the last 168 hours. No results for input(s): LIPASE, AMYLASE in the last 168 hours. No results for input(s): AMMONIA in the last 168 hours. Coagulation Profile: No results for input(s): INR, PROTIME in the last 168 hours. Cardiac Enzymes: No results for input(s): CKTOTAL, CKMB, CKMBINDEX, TROPONINI in the last 168 hours. BNP (last 3 results) No results for input(s): PROBNP in the last 8760 hours. HbA1C: No results for input(s): HGBA1C in the last 72 hours. CBG: No results for input(s): GLUCAP in the last 168 hours. Lipid Profile: No results for input(s): CHOL, HDL, LDLCALC, TRIG, CHOLHDL, LDLDIRECT in the last 72 hours. Thyroid Function  Tests: No results for input(s): TSH, T4TOTAL, FREET4, T3FREE, THYROIDAB in the last 72 hours. Anemia Panel: No results for input(s): VITAMINB12, FOLATE, FERRITIN, TIBC, IRON, RETICCTPCT in the last 72 hours. Urine analysis:    Component Value Date/Time   COLORURINE AMBER (A) 03/04/2016 0050   APPEARANCEUR CLOUDY (A) 03/04/2016 0050   LABSPEC 1.022 03/04/2016 0050   PHURINE 6.0 03/04/2016 0050   GLUCOSEU NEGATIVE 03/04/2016 0050   HGBUR SMALL (A) 03/04/2016 0050   BILIRUBINUR SMALL (A) 03/04/2016 0050   KETONESUR 15 (A) 03/04/2016 0050   PROTEINUR 30 (A) 03/04/2016 0050   NITRITE NEGATIVE 03/04/2016 0050   LEUKOCYTESUR MODERATE (A) 03/04/2016 0050     Shakeisha Horine M.D. [facility] 03/08/2016, 1:30 PM  Pager: [phone] Between 7am to 7pm - call Pager - [phone]  After 7pm go to [email or web address] - password [email or web address]  Call night coverage person covering after 7pm

## 2016-03-08 NOTE — Progress Notes (Signed)
Tara Frederick 1:03 PM  Subjective: Patient doing fine except for not liking her NG and she has tolerated clear liquids and is still having loose liquid bowel movements and no new complaints and I had a long discussion with her and her family and offered them an x-ray to rule out any at risk lesions or advancing diet and seeing how she does and then working this up if it recurs and they prefer to proceed with the x-ray which I have ordered  Objective: Vital signs stable afebrile no acute distress abdomen is soft nontender occasional bowel sounds labs stable  Assessment: Colonic obstruction probably resolved  Plan: After our conversation with the patient and her family will proceed with a Gastrografin enema to rule out any near obstructing lesions with further workup and plans pending those findings and in the meantime okay to remove NG tube but do not advance diet until after x-ray in case colonoscopy is needed  [facility] E  Pager [phone] After 5PM or if no answer call [phone]

## 2016-03-08 NOTE — Progress Notes (Signed)
  PT Cancellation Note  Patient Details Name: Raelena Blando MRN: [id number] DOB: [birth year]   Cancelled Treatment:    Reason Eval/Treat Not Completed: Patient at procedure or test/unavailable. Will check back as time allows.    Berlene Dixson, [place] 03/08/2016, 2:46 PM

## 2016-03-09 MED ORDER — ACETAMINOPHEN 325 MG PO TABS
650.0000 mg | ORAL_TABLET | Freq: Four times a day (QID) | ORAL | Status: DC | PRN
  Administered 2016-03-09 – 2016-03-10 (×2): 650 mg via ORAL
  Filled 2016-03-09 (×2): qty 2

## 2016-03-09 MED ORDER — KETOROLAC TROMETHAMINE 15 MG/ML IJ SOLN
15.0000 mg | Freq: Four times a day (QID) | INTRAMUSCULAR | Status: DC | PRN
  Administered 2016-03-09 – 2016-03-10 (×3): 15 mg via INTRAVENOUS
  Filled 2016-03-09 (×3): qty 1

## 2016-03-09 NOTE — Progress Notes (Signed)
[name] 1:03 PM  Subjective: Patient doing fine without any complaints although she did not like the enema and all of her and her daughter's questions were answered and we discussed diet long-term MiraLAX minimizing narcotics etc.  Objective: Vital signs stable afebrile no acute distress abdomen is soft nontender Gastrografin enema okay no obvious obstruction or at risk lesion  Assessment: Improved constipation secondary to stopping MiraLAX and increased narcotic use and decreased mobility secondary to back problems Plan: Okay to advance diet continue long-term MiraLAX and please call us back if we could be of any further assistance with this hospital stay and happy to see back as an outpatient if needed  [facility]  Pager [phone] After 5PM or if no answer call [phone]

## 2016-03-09 NOTE — Care Management Note (Addendum)
Case Management Note  Patient Details  Name: Tara Frederick MRN: [id number] Date of Birth: 01/25/1924  Subjective/Objective:        Admitted with severe obstipation,  history significant of HTN, GERD, degenerative disk disease. Lives alone in [place], [place]. Pt states has used [employer].           Action/Plan:   Expected Discharge Date:                  Expected Discharge Plan:  Home w Home Health Services (Lives alone)  In-House Referral:     Discharge planning Services  CM Consult  Post Acute Care Choice:    Choice offered to:     DME Arranged:   (owns cane) DME Agency:     HH Arranged:    HH Agency:  [facility]  Status of Service:  In process, will continue to follow  If discussed at Long Length of Stay Meetings, dates discussed:    Additional Comments:  Epifanio Lesches, RN 03/09/2016, 11:10 AM

## 2016-03-09 NOTE — Evaluation (Signed)
Physical Therapy Evaluation Patient Details Name: Tara Frederick MRN: [id number] DOB: Jul 16, 1924 Today's Date: 03/09/2016   History of Present Illness  82 y.o. female with medical history significant of HTN, GERD, degenerative disk disease.  Patient given narcotics in ED last week for back pain. Presented to [facility] Patient was admitted given oral MiraLAX and some enemas with no significant improvement. Abdominal films showed worsening cecal dilatation. GI was consulted. A G-tube placed on the fluoroscopy. Patient given every 2 hour MiraLAX and tap water enemas. Patient finally with multiple bowel movements. s/p Gastrografin enema 8/2.   Clinical Impression  Patient presents with generalized weakness, deconditioning, back pain and impaired endurance s/p above impacting mobility. PTA, pt independent and driving. Tolerated gait training with Min A for balance/safety and distance limited due to back pain. Recommend use of RW until strength/mobility improve. Pt will have support of family at discharge. Encouraged ambulation 3x/day to improve mobility/endurance. Will follow acutely to maximize independence and mobility prior to return home.    Follow Up Recommendations Home health PT;Supervision for mobility/OOB    Equipment Recommendations  None recommended by PT    Recommendations for Other Services       Precautions / Restrictions Precautions Precautions: Fall Restrictions Weight Bearing Restrictions: No      Mobility  Bed Mobility               General bed mobility comments: Up in chair upon PT arrival.   Transfers Overall transfer level: Needs assistance Equipment used: None Transfers: Sit to/from Stand Sit to Stand: Min guard         General transfer comment: Min guard for safety due to unsteadiness upon standing.   Ambulation/Gait Ambulation/Gait assistance: Min assist Ambulation Distance (Feet): 125 Feet Assistive device: Rolling walker (2 wheeled) Gait  Pattern/deviations: Step-through pattern;Decreased stride length;Trunk flexed Gait velocity: decreased Gait velocity interpretation: <1.8 ft/sec, indicative of risk for recurrent falls General Gait Details: Slow, mostly steady gait using RW. Furniture walker within room. Back pain limiting distance.  Stairs            Wheelchair Mobility    Modified Rankin (Stroke Patients Only)       Balance Overall balance assessment: Needs assistance Sitting-balance support: Feet supported;No upper extremity supported Sitting balance-Leahy Scale: Good     Standing balance support: During functional activity Standing balance-Leahy Scale: Fair Standing balance comment: Requires UE support in standing.                             Pertinent Vitals/Pain Pain Assessment: Faces Faces Pain Scale: Hurts little more Pain Location: back with mobility Pain Descriptors / Indicators: Grimacing Pain Intervention(s): Monitored during session;Repositioned    Home Living Family/patient expects to be discharged to:: Private residence Living Arrangements: Alone Available Help at Discharge: Family;Available 24 hours/day Type of Home: House Home Access: Stairs to enter   [employer] of Steps: 1 Home Layout: Two level;Able to live on main level with bedroom/bathroom Home Equipment: Dan Humphreys - 2 wheels;Cane - single point;Shower seat;Grab bars - tub/shower      Prior Function Level of Independence: Independent         Comments: still drives; enjoys reading     Hand Dominance   Dominant Hand: Right    Extremity/Trunk Assessment   Upper Extremity Assessment: Defer to OT evaluation;Overall [facility] for tasks assessed           Lower Extremity Assessment: Generalized weakness  Cervical / Trunk Assessment: Other exceptions (hx of back problems)  Communication   Communication: No difficulties  Cognition Arousal/Alertness: Awake/alert Behavior During Therapy: WFL  for tasks assessed/performed Overall Cognitive Status: Within Functional Limits for tasks assessed                      General Comments      Exercises        Assessment/Plan    PT Assessment Patient needs continued PT services  PT Diagnosis Difficulty walking;Acute pain   PT Problem List Decreased strength;Decreased mobility;Decreased activity tolerance;Decreased balance;Pain  PT Treatment Interventions Therapeutic activities;Gait training;Stair training;Balance training;Functional mobility training;Therapeutic exercise;DME instruction;Patient/family education   PT Goals (Current goals can be found in the Care Plan section) Acute Rehab PT Goals Patient Stated Goal: to return to PLOF PT Goal Formulation: With patient Time For Goal Achievement: 03/23/16 Potential to Achieve Goals: Good    Frequency Min 3X/week   Barriers to discharge        Co-evaluation               End of Session Equipment Utilized During Treatment: Gait belt Activity Tolerance: Patient tolerated treatment well;Patient limited by pain Patient left: in chair;with call bell/phone within reach;with chair alarm set Nurse Communication: Mobility status         Time: 1610-9604 PT Time Calculation (min) (ACUTE ONLY): 17 min   Charges:   PT Evaluation $PT Eval Low Complexity: 1 Procedure     PT G Codes:        Roxann Vierra A Ryen Rhames 03/09/2016, 11:35 AM Mylo Red, PT, DPT [phone]

## 2016-03-09 NOTE — Progress Notes (Signed)
[facility]                                                                              Patient Demographics  Tara Frederick, is a 82 y.o. female, DOB - [birth year], ZOX:[id number]  Admit date - 03/03/2016   Admitting Physician Starleen Arms, MD  Outpatient Primary MD for the patient is No primary care provider on file.  Outpatient specialists:   LOS - 6  days    No chief complaint on file.      Brief summary   Tara Frederick a 82 y.o.femalewith medical history significant of HTN, GERD, degenerative disk disease. Patient given narcotics in ED last week for back pain. Since then she has had severe constipation. Took narcotic last on Sunday. No BM since then. Presented to [facility] 3 days ago. Was admitted. CT scan at that time showed severe constipation. Attempts made to treat medically include: 2L golytely, miralax BID, dulcolax  PO daily, senna, multiple fleet enemas, and 2 attempts at manual disimpaction (no stool in rectum). All of the above attempts to treat medically have failed, and today patient finally developed nausea and ultimately numerous episodes of vomiting on ambulance ride down here. Patient was admitted given oral MiraLAX and some enemas with no significant improvement. Abdominal films showed worsening cecal dilatation. GI was consulted. A G-tube placed on the fluoroscopy. Patient given every 2 hour MiraLAX and tap water enemas. Patient finally with multiple bowel movements. Patient for Gastrografin enema tomorrow 03/08/2016.    Assessment & Plan    Severe obstipation - Patient's last bowel movement was July 12 after receiving narcotic pain medications for back injury, Presented with worsening abdominal distention and inability to have bowel movements despite multiple laxatives and multiple Fleet enemas as well as an attempt at manual disimpaction with no stool noted in the rectal vault the outside hospital.  -  Plain films of the  abdomen with a markedly dilated loop of colon in the right side of the abdomen measuring approximately 11.7 cm. Increasing risk for perforation. Concern for possible sigmoid volvulus. -  CT abdomen and pelvis done 03/04/2016 with a distended cecum measuring 9.5 cm, ascending colon transverse colon and proximal descending colon filled with large amount of stool. 25% superior endplate compression fracture of L1 indeterminate.  - multiple attempts at NG tube placement the night of 03/04/2016 was unsuccessful. Patient also had multiple tap water enemas were no results. NG tube placed under fluoroscopy per radiology 03/05/2016. NG tube placed to suction.  - Subsequently, patient had multiple BMs on 7/30-8/1 while on every 2 hours MiraLAX with improvement with abdominal distention and abdominal pain. Abdominal x-rays with improvement. Significant clinical improvement. NG tube was clamped - Patient was started on clears, GI has been following closely.  - Gastrografin enema on 8/2 negative for any obstructing lesions - NGT removed, advance diet today, continue long-term MiraLAX   probable UTI Urine cultures with multiple morphotypes. Continue IV Levaquin and treat for total of 5-7 days.  leukocytosis Likely secondary to problem #2 and reactive leukocytosis. - Currently no leukocytosis, urine cultures with multiple morphotypes. Continue empiric IV Levaquin.  GERD  Continue IV PPI and transition to oral PPI when tolerating oral diet.    hypertension Continue Norvasc.   Code Status: Full  DVT Prophylaxis:  Lovenox  Family Communication: Discussed in detail with the patient, all imaging results, lab results explained to the patient   Disposition Plan: Hopefully DC home in a.m. if no acute issues and tolerating solid diet  Time Spent in minutes 25 minutes  Procedures:   CXR 03/04/2016  KUB 03/04/2016  CT abdomen and pelvis 03/04/2016  NG tube placement under fluoroscopy  03/05/2016  Consultants:   Gastroenterology surgery  Antimicrobials:   IV levaquin   Medications  Scheduled Meds: . amLODipine  5 mg Oral Daily  . enoxaparin (LOVENOX) injection  40 mg Subcutaneous QHS  . famotidine (PEPCID) IV  20 mg Intravenous Q12H  . latanoprost  1 drop Left Eye QHS  . levofloxacin (LEVAQUIN) IV  750 mg Intravenous Q48H  . lidocaine  15 mL Mouth/Throat Once  . polyethylene glycol  17 g Per NG tube BID   Continuous Infusions:   PRN Meds:.acetaminophen, ketorolac, ondansetron (ZOFRAN) IV, phenol, prochlorperazine   Antibiotics   Anti-infectives    Start     Dose/Rate Route Frequency Ordered Stop   03/08/16 0800  levofloxacin (LEVAQUIN) IVPB 750 mg     750 mg 100 mL/hr over 90 Minutes Intravenous Every 48 hours 03/06/16 1029     03/04/16 0800  levofloxacin (LEVAQUIN) IVPB 750 mg  Status:  Discontinued     750 mg 100 mL/hr over 90 Minutes Intravenous Every 24 hours 03/03/16 2221 03/06/16 1029        Subjective:   [name] was seen and examined today. Multiple bowel movements after the procedure yesterday. Feels much better, no abdominal distention. No nausea or vomiting. No fevers or chills.  Patient denies dizziness, chest pain, shortness of breath, new weakness, numbess, tingling. No acute events overnight.    Objective:   Vitals:   03/08/16 1345 03/08/16 2140 03/09/16 0556 03/09/16 1351  BP: (!) 144/65 139/70 (!) 148/70 (!) 137/56  Pulse: (!) 102 (!) 105 (!) 110 (!) 108  Resp: (!) 24 19 18    Temp: 98.5 F (36.9 C) 98.9 F (37.2 C) 98.6 F (37 C) 98.4 F (36.9 C)  TempSrc: Oral   Oral  SpO2: 96% 95% 91% 94%  Weight:      Height:        Intake/Output Summary (Last 24 hours) at 03/09/16 1415 Last data filed at 03/09/16 1316  Gross per 24 hour  Intake           1892.5 ml  Output              650 ml  Net           1242.5 ml     Wt Readings from Last 3 Encounters:  03/03/16 58.9 kg (129 lb 13.6 oz)     Exam  General:  Alert and oriented x 3, NAD  HEENT:    Neck:   Cardiovascular: S1 S2 auscultated, no rubs, murmurs or gallops. Regular rate and rhythm.  Respiratory: Clear to auscultation bilaterally, no wheezing, rales or rhonchi  Gastrointestinal: Soft, nontender, nondistended, hypoactive bowel sounds  Ext: no cyanosis clubbing or edema  Neuro:  Skin: No rashes  Psych: Normal affect and demeanor, alert and oriented x3    Data Reviewed:  I have personally reviewed following labs and imaging studies  Micro Results Recent Results (from the past 240 hour(s))  Urine  culture     Status: Abnormal   Collection Time: 03/04/16  6:07 PM  Result Value Ref Range Status   Specimen Description URINE, RANDOM  Final   Special Requests NONE  Final   Culture MULTIPLE SPECIES PRESENT, SUGGEST RECOLLECTION (A)  Final   Report Status 03/05/2016 FINAL  Final    Radiology Reports Dg Chest 2 View  Result Date: 03/04/2016 CLINICAL DATA:  Leukocytosis. EXAM: CHEST  2 VIEW COMPARISON:  None. FINDINGS: Small bilateral effusions. The heart, hila, an mediastinum are unremarkable. No pulmonary nodules or masses. No focal infiltrates. Mild pleural thickening laterally in the right chest, likely chronic. No other acute abnormalities. IMPRESSION: Small effusions and underlying atelectasis. Electronically Signed   By: Gerome Sam III M.D   On: 03/04/2016 11:29  Ct Abdomen Pelvis W Contrast  Result Date: 03/04/2016 CLINICAL DATA:  82 year old female with abdominal pain and distention and severe constipation. EXAM: CT ABDOMEN AND PELVIS WITH CONTRAST TECHNIQUE: Multidetector CT imaging of the abdomen and pelvis was performed using the standard protocol following bolus administration of intravenous contrast. CONTRAST:  80mL ISOVUE-300 IOPAMIDOL (ISOVUE-300) INJECTION 61% COMPARISON:  03/04/2016 FINDINGS: Lower chest: Mild cardiomegaly noted. A moderate hiatal hernia is noted. There is filling defect within this hernia which  could represent food but difficult to exclude a mass. Very small bilateral pleural effusions and mild bibasilar atelectasis noted. Hepatobiliary: The liver and gallbladder are unremarkable. There is no evidence of biliary dilatation. Pancreas: Atrophic without other significant abnormality. Spleen: Unremarkable Adrenals/Urinary Tract: Bilateral renal atrophy identified. There is no evidence of hydronephrosis. The adrenal glands and bladder are unremarkable. Stomach/Bowel: A very large amount of stool is noted within the cecum, ascending colon, transverse colon and proximal descending colon. Gas and fluid within the cecum is also identified which measures up to 9.5 cm in greatest diameter. There is a relative transition point in the mid descending colon without definite colonic abnormality. No wall thickening is identified. There is no evidence of small bowel dilatation. Vascular/Lymphatic: Aortic atherosclerotic calcifications noted without aneurysm. No enlarged lymph nodes are identified. Reproductive: The patient is status post hysterectomy. No adnexal masses identified. Other: No ascites, pneumoperitoneum or abscess. Musculoskeletal: Degenerative changes within the lumbar spine identified. 25% superior endplate compression fracture of L1 is age indeterminate. Mild bony retropulsion noted. IMPRESSION: Distended cecum, ascending colon, transverse colon and proximal descending colon filled with large amount of stool. Relative transition point in the mid descending colon without definite colonic abnormality. Consider direct inspection. No evidence of bowel wall thickening, small bowel obstruction or pneumoperitoneum. Moderate hiatal hernia with filling defect, which could represent feet but a mass is not entirely excluded. Consider direct inspection as indicated. 25% superior endplate compression fracture of L1- indeterminate. Very small bilateral pleural effusions with bibasilar atelectasis. Cardiomegaly.  Electronically Signed   By: Harmon Pier M.D.   On: 03/04/2016 16:36  Dg Abd 2 Views  Result Date: 03/07/2016 CLINICAL DATA:  Abdominal discomfort EXAM: ABDOMEN - 2 VIEW COMPARISON:  03/06/2016 FINDINGS: Nasogastric catheter is again seen and stable. Scattered large and small bowel gas is noted. Persistent dilatation is noted in the colon on the right although the degree of dilatation has improved significantly now 10.7 cm in the right mid abdomen. No free air is seen. No bony abnormality is noted. IMPRESSION: Mild persistent right colonic dilatation although significantly improved from the prior exam. Electronically Signed   By: Alcide Clever M.D.   On: 03/07/2016 08:02   Dg Abd 2 Views  Result Date: 03/06/2016 CLINICAL DATA:  82 year old female with obstipation. Subsequent encounter. EXAM: ABDOMEN - 2 VIEW COMPARISON:  03/05/2016 plain film exam.  03/04/2016 FINDINGS: Nasogastric tube is in place. Tip projects over the left upper quadrant. Side hole just below the diaphragm level. Patient has a hiatal hernia as noted on recent CT. Gas distended colon. Dilated cecum difficult adequately measure as there may be a superior medial displacement of the cecum. Using similar landmarks to recent plain film examination. Dilated proximal right colon/cecum now spanning over 19.2 cm versus prior 14.3 cm. Progressive distention of gas distended transverse colon measuring up to 8.2 cm versus prior 6.7 cm. Cannot exclude obstructing lesion.  Please see recent CT report. No free intraperitoneal air noted on decubitus view. IMPRESSION: Progressive distension of the right colon/cecum and transverse colon as noted above. Electronically Signed   By: Lacy Duverney M.D.   On: 03/06/2016 08:00  Dg Abd 2 Views  Result Date: 03/05/2016 CLINICAL DATA:  Obstipation EXAM: ABDOMEN - 2 VIEW COMPARISON:  CT 03/04/2016 FINDINGS: Diffuse gaseous distention of the colon, most pronounced involving the cecum. Cecum measures approximately 14  cm in diameter. When the cecal diameter was measured on the scout image on prior study, this measured 13 cm. No free air organomegaly. IMPRESSION: Marked gaseous distention of the colon, particularly cecum. This is similar to prior CT. Electronically Signed   By: Charlett Nose M.D.   On: 03/05/2016 09:05  Dg Abd 2 Views  Result Date: 03/04/2016 CLINICAL DATA:  Leukocytosis and constipation EXAM: ABDOMEN - 2 VIEW COMPARISON:  None. FINDINGS: No free air, portal venous gas, or pneumatosis. Small effusions in the lung bases. Markedly dilated colon primarily in the right lower abdomen with air-fluid levels on the upright view. The most dilated loop measures up to 11.7 cm increasing the risk of perforation. The more distal loops of colon are relatively decompressed but air does extend into the rectum. No pneumatosis. No other acute abnormalities. IMPRESSION: There is a markedly dilated loop of colon in the right side of the abdomen. A sigmoid volvulus is not excluded. Recommend a CT scan for further evaluation. Findings being called to Dr. Maisie Fus. Electronically Signed   By: Gerome Sam III M.D   On: 03/04/2016 11:39  Dg Basil Dess Tube Plc W/fl W/rad  Result Date: 03/05/2016 CLINICAL DATA:  Colonic distention. Request for nasogastric tube placement. Difficult to place a nasogastric tube on the floor. EXAM: NASO G TUBE PLACEMENT WITH FL AND WITH RAD CONTRAST:  28 mL of Gastrografin injected through nasogastric tube FLUOROSCOPY TIME:  If the device does not provide the exposure index: Fluoroscopy Time (in minutes and seconds):  1 minutes and 6 seconds Number of Acquired Images:  2 COMPARISON:  Abdominal CT 03/04/2016 FINDINGS: Both nostrils anesthetized with viscous lidocaine. 14 French nasogastric tube would not easily advance through the right nostril. The tube easily advanced through the left nostril. Initially, the tube appeared to advance into the airway. The tube was pulled back and directed into the  esophagus. Tube was advanced into the upper abdomen. Contrast injection confirmed placement in the stomach. IMPRESSION: Successful placement of a nasogastric tube with fluoroscopy. Catheter tip in the stomach. Electronically Signed   By: Richarda Overlie M.D.   On: 03/05/2016 14:36  Dg Colon W/water Sol Cm  Result Date: 03/08/2016 CLINICAL DATA:  Colonic obstruction. EXAM: WATER SOLUBLE CONTRAST ENEMA TECHNIQUE: Enema was performed using water-soluble contrast. FLUOROSCOPY TIME:  Radiation Exposure Index (as provided by the  fluoroscopic device): 2 minutes If the device does not provide the exposure index: Fluoroscopy Time:  dictate in minutes and seconds Number of Acquired Images:  6 COMPARISON:  CT 03/04/2016.  Plain films 03/07/2016. FINDINGS: There is a slow gradual caliber change of the colon from the sigmoid colon into the descending colon. Mild distention of the transverse colon PICC. Cecum is moderately distended with gas. There is no colonic obstruction noted. Scattered sigmoid diverticula. IMPRESSION: No evidence of colonic obstruction. Scattered sigmoid diverticulosis. Electronically Signed   By: Charlett Nose M.D.   On: 03/08/2016 15:33    Lab Data:  CBC:  Recent Labs Lab 03/04/16 0502 03/05/16 0759 03/06/16 0621 03/07/16 0602 03/08/16 1013  WBC 15.4* 13.2* 16.4* 9.9 9.9  NEUTROABS  --  9.5*  --   --   --   HGB 11.9* 11.0* 11.4* 9.8* 11.0*  HCT 36.1 33.5* 35.4* 29.6* 32.7*  MCV 87.6 88.6 89.2 88.6 88.1  PLT 381 378 417* 373 338   Basic Metabolic Panel:  Recent Labs Lab 03/04/16 0502 03/05/16 0532 03/05/16 0759 03/06/16 0621 03/07/16 0602 03/08/16 0909  NA 132*  --  134* 135 133* 131*  K 3.3*  --  4.0 4.3 3.8 3.5  CL 96*  --  102 103 100* 96*  CO2 22  --  24 21* 24 23  GLUCOSE 104*  --  99 89 66 98  BUN 22*  --  26* 26* 15 8  CREATININE 0.97  --  0.84 0.87 0.72 0.74  CALCIUM 10.6*  --  10.1 10.6* 9.8 9.8  MG 1.5* 2.1  --  1.7 1.6* 1.5*   GFR: Estimated Creatinine  Clearance: 37.7 mL/min (by C-G formula based on SCr of 0.8 mg/dL). Liver Function Tests: No results for input(s): AST, ALT, ALKPHOS, BILITOT, PROT, ALBUMIN in the last 168 hours. No results for input(s): LIPASE, AMYLASE in the last 168 hours. No results for input(s): AMMONIA in the last 168 hours. Coagulation Profile: No results for input(s): INR, PROTIME in the last 168 hours. Cardiac Enzymes: No results for input(s): CKTOTAL, CKMB, CKMBINDEX, TROPONINI in the last 168 hours. BNP (last 3 results) No results for input(s): PROBNP in the last 8760 hours. HbA1C: No results for input(s): HGBA1C in the last 72 hours. CBG: No results for input(s): GLUCAP in the last 168 hours. Lipid Profile: No results for input(s): CHOL, HDL, LDLCALC, TRIG, CHOLHDL, LDLDIRECT in the last 72 hours. Thyroid Function Tests: No results for input(s): TSH, T4TOTAL, FREET4, T3FREE, THYROIDAB in the last 72 hours. Anemia Panel: No results for input(s): VITAMINB12, FOLATE, FERRITIN, TIBC, IRON, RETICCTPCT in the last 72 hours. Urine analysis:    Component Value Date/Time   COLORURINE AMBER (A) 03/04/2016 0050   APPEARANCEUR CLOUDY (A) 03/04/2016 0050   LABSPEC 1.022 03/04/2016 0050   PHURINE 6.0 03/04/2016 0050   GLUCOSEU NEGATIVE 03/04/2016 0050   HGBUR SMALL (A) 03/04/2016 0050   BILIRUBINUR SMALL (A) 03/04/2016 0050   KETONESUR 15 (A) 03/04/2016 0050   PROTEINUR 30 (A) 03/04/2016 0050   NITRITE NEGATIVE 03/04/2016 0050   LEUKOCYTESUR MODERATE (A) 03/04/2016 0050     Dain Laseter M.D. [facility] 03/09/2016, 2:15 PM  Pager: [phone] Between 7am to 7pm - call Pager - [phone]  After 7pm go to [email or web address] - password [email or web address]  Call night coverage person covering after 7pm

## 2016-03-10 ENCOUNTER — Inpatient Hospital Stay (HOSPITAL_COMMUNITY): Payer: Medicare Other

## 2016-03-10 DIAGNOSIS — L8990 Pressure ulcer of unspecified site, unspecified stage: Secondary | ICD-10-CM | POA: Insufficient documentation

## 2016-03-10 MED ORDER — NAPROXEN 250 MG PO TABS
250.0000 mg | ORAL_TABLET | Freq: Two times a day (BID) | ORAL | Status: DC
  Administered 2016-03-10 – 2016-03-12 (×5): 250 mg via ORAL
  Filled 2016-03-10 (×5): qty 1

## 2016-03-10 MED ORDER — KETOROLAC TROMETHAMINE 15 MG/ML IJ SOLN
30.0000 mg | Freq: Four times a day (QID) | INTRAMUSCULAR | Status: DC | PRN
  Administered 2016-03-10 – 2016-03-12 (×5): 30 mg via INTRAVENOUS
  Filled 2016-03-10 (×5): qty 2

## 2016-03-10 MED ORDER — CYCLOBENZAPRINE HCL 5 MG PO TABS
5.0000 mg | ORAL_TABLET | Freq: Three times a day (TID) | ORAL | Status: DC
  Administered 2016-03-10 – 2016-03-16 (×15): 5 mg via ORAL
  Filled 2016-03-10 (×16): qty 1

## 2016-03-10 MED ORDER — ACETAMINOPHEN 500 MG PO TABS
1000.0000 mg | ORAL_TABLET | Freq: Three times a day (TID) | ORAL | Status: DC
  Administered 2016-03-10 – 2016-03-12 (×7): 1000 mg via ORAL
  Filled 2016-03-10 (×7): qty 2

## 2016-03-10 MED ORDER — CYCLOBENZAPRINE HCL 5 MG PO TABS
5.0000 mg | ORAL_TABLET | Freq: Two times a day (BID) | ORAL | Status: DC
  Administered 2016-03-10: 5 mg via ORAL
  Filled 2016-03-10: qty 1

## 2016-03-10 MED ORDER — PANTOPRAZOLE SODIUM 40 MG PO TBEC
40.0000 mg | DELAYED_RELEASE_TABLET | Freq: Every day | ORAL | Status: DC
  Administered 2016-03-10 – 2016-03-16 (×7): 40 mg via ORAL
  Filled 2016-03-10 (×7): qty 1

## 2016-03-10 MED ORDER — LIDOCAINE 5 % EX PTCH
1.0000 | MEDICATED_PATCH | CUTANEOUS | Status: DC
  Administered 2016-03-10 – 2016-03-16 (×7): 1 via TRANSDERMAL
  Filled 2016-03-10 (×8): qty 1

## 2016-03-10 NOTE — Progress Notes (Signed)
PT Cancellation Note  Patient Details Name: Tara Frederick MRN: [id number] DOB: 05-22-1924   Cancelled Treatment:    Reason Eval/Treat Not Completed: Patient declined, no reason specified;Pain limiting ability to participate Pt reports new back pain since hitting it on the metal faucet over the toilet yesterday. Declined mobility due to pain despite education on importance of mobility. Daughter present and concerned about pt going home. To get MRI today. Will follow up.   Blake Divine A Samiel Peel 03/10/2016, 1:35 PM Mylo Red, PT, DPT [phone]

## 2016-03-10 NOTE — Care Management Important Message (Signed)
Important Message  Patient Details  Name: Tara Frederick MRN: [id number] Date of Birth: 01/04/1924   Medicare Important Message Given:  Yes    [name] 03/10/2016, 11:17 AM

## 2016-03-10 NOTE — Progress Notes (Signed)
OT Cancellation Note  Patient Details Name: Genella Schouten MRN: [id number] DOB: Jul 19, 1924   Cancelled Treatment:    Reason Eval/Treat Not Completed: Pain limiting ability to participate. Pt complaining of new onset back pain after hitting in on the metal faucet above toilet yesterday. Pt get MRI today. Will check back tomorrow if time allows.  Nils Pyle, OTR/L Pager: [phone] 03/10/2016, 2:35 PM

## 2016-03-10 NOTE — Progress Notes (Addendum)
[facility]                                                                              Patient Demographics  Tara Frederick, is a 82 y.o. female, DOB - [birth year], ZOX:[id number]  Admit date - 03/03/2016   Admitting Physician Starleen Arms, MD  Outpatient Primary MD for the patient is No primary care provider on file.  Outpatient specialists:   LOS - 7  days    No chief complaint on file.      Brief summary   Tara Frederick a 82 y.o.femalewith medical history significant of HTN, GERD, degenerative disk disease. Patient given narcotics in ED last week for back pain. Since then she has had severe constipation. Took narcotic last on Sunday. No BM since then. Presented to [facility] 3 days ago. Was admitted. CT scan at that time showed severe constipation. Attempts made to treat medically include: 2L golytely, miralax BID, dulcolax 20mg  PO daily, senna, multiple fleet enemas, and 2 attempts at manual disimpaction (no stool in rectum). All of the above attempts to treat medically have failed, and today patient finally developed nausea and ultimately numerous episodes of vomiting on ambulance ride down here. Patient was admitted given oral MiraLAX and some enemas with no significant improvement. Abdominal films showed worsening cecal dilatation. GI was consulted. A G-tube placed on the fluoroscopy. Patient given every 2 hour MiraLAX and tap water enemas. Patient finally with multiple bowel movements. Patient for Gastrografin enema tomorrow 03/08/2016.    Assessment & Plan    Severe obstipation - Patient's last bowel movement was July 12 after receiving narcotic pain medications for back injury, Presented with worsening abdominal distention and inability to have bowel movements despite multiple laxatives and multiple Fleet enemas as well as an attempt at manual disimpaction with no stool noted in the rectal vault the outside hospital.  -  Plain films of the  abdomen with a markedly dilated loop of colon in the right side of the abdomen measuring approximately 11.7 cm. Increasing risk for perforation. Concern for possible sigmoid volvulus. -  CT abdomen and pelvis done 03/04/2016 with a distended cecum measuring 9.5 cm, ascending colon transverse colon and proximal descending colon filled with large amount of stool. 25% superior endplate compression fracture of L1 indeterminate.  - multiple attempts at NG tube placement the night of 03/04/2016 was unsuccessful. Patient also had multiple tap water enemas were no results. NG tube placed under fluoroscopy per radiology 03/05/2016. NG tube placed to suction.  - Subsequently, patient had multiple BMs on 7/30-8/1 while on every 2 hours MiraLAX with improvement with abdominal distention and abdominal pain. Abdominal x-rays with improvement. Significant clinical improvement. NG tube was clamped - Patient was started on clears, GI has been following closely.  - Gastrografin enema on 8/2 negative for any obstructing lesions - NGT removed, advance diet today, continue long-term MiraLAX  Severe back pain Patient complaining of low back pain, stated that this is what led to her original complaint of severe constipation when patient was started on pain medications For now placed on tramadol, Toradol and Flexeril, obtain MRI of the lumbar spine.  No neurological deficits. Addendum MRI L spine showed subacute compression fracture of L1, d/w patient's daughter, agreed for kyphoplasty. IR to eval.    probable UTI Urine cultures with multiple morphotypes. Continue IV Levaquin and treat for total of 5-7 days.  leukocytosis Likely secondary to problem #2 and reactive leukocytosis. - Currently no leukocytosis, urine cultures with multiple morphotypes. Continue empiric IV Levaquin.  GERD Continue IV PPI and transition to oral PPI when tolerating oral diet.    hypertension Continue Norvasc.   Code Status: Full    DVT Prophylaxis:  Lovenox  Family Communication: Discussed in detail with the patient, all imaging results, lab results explained to the patient   Disposition Plan:   Time Spent in minutes 25 minutes  Procedures:   CXR 03/04/2016  KUB 03/04/2016  CT abdomen and pelvis 03/04/2016  NG tube placement under fluoroscopy 03/05/2016  Consultants:   Gastroenterology surgery  Antimicrobials:   IV levaquin   Medications  Scheduled Meds: . acetaminophen  1,000 mg Oral TID  . amLODipine  5 mg Oral Daily  . cyclobenzaprine  5 mg Oral BID  . enoxaparin (LOVENOX) injection  40 mg Subcutaneous QHS  . latanoprost  1 drop Left Eye QHS  . levofloxacin (LEVAQUIN) IV  750 mg Intravenous Q48H  . lidocaine  1 patch Transdermal Q24H  . naproxen  250 mg Oral BID WC  . pantoprazole  40 mg Oral Q0600  . polyethylene glycol  17 g Per NG tube BID   Continuous Infusions:   PRN Meds:.acetaminophen, ketorolac, ondansetron (ZOFRAN) IV, phenol, prochlorperazine   Antibiotics   Anti-infectives    Start     Dose/Rate Route Frequency Ordered Stop   03/08/16 0800  levofloxacin (LEVAQUIN) IVPB 750 mg     750 mg 100 mL/hr over 90 Minutes Intravenous Every 48 hours 03/06/16 1029     03/04/16 0800  levofloxacin (LEVAQUIN) IVPB 750 mg  Status:  Discontinued     750 mg 100 mL/hr over 90 Minutes Intravenous Every 24 hours 03/03/16 2221 03/06/16 1029        Subjective:   Tara Frederick was seen and examined today. Complaining of low back pain, no lower extremity weakness or numbness or tingling or urinary incontinence. States no BM this morning. No abdominal distention. No nausea or vomiting. No fevers or chills.  Patient denies dizziness, chest pain, shortness of breath, new weakness, numbess, tingling. No acute events overnight.    Objective:   Vitals:   03/09/16 0556 03/09/16 1351 03/09/16 2116 03/10/16 0506  BP: (!) 148/70 (!) 137/56 (!) 125/59 (!) 142/83  Pulse: (!) 110 (!) 108 98 (!) 103   Resp: 18  18 18   Temp: 98.6 F (37 C) 98.4 F (36.9 C) 98.4 F (36.9 C) 98.1 F (36.7 C)  TempSrc:  Oral Oral Oral  SpO2: 91% 94% 91% 91%  Weight:      Height:        Intake/Output Summary (Last 24 hours) at 03/10/16 1221 Last data filed at 03/10/16 1017  Gross per 24 hour  Intake              840 ml  Output             1750 ml  Net             -910 ml     Wt Readings from Last 3 Encounters:  03/03/16 58.9 kg (129 lb 13.6 oz)     Exam  General: Alert and oriented  x 3, NAD  HEENT:    Neck:   Cardiovascular: S1 S2 auscultated, no rubs, murmurs or gallops. Regular rate and rhythm.  Respiratory: Clear to auscultation bilaterally, no wheezing, rales or rhonchi  Gastrointestinal: Soft, nontender, nondistended, hypoactive bowel sounds  Ext: no cyanosis clubbing or edema  Neuro: on back exam, patient appears to have tenderness on the L3-4 and L4-5 region  Skin: No rashes  Psych: Normal affect and demeanor, alert and oriented x3    Data Reviewed:  I have personally reviewed following labs and imaging studies  Micro Results Recent Results (from the past 240 hour(s))  Urine culture     Status: Abnormal   Collection Time: 03/04/16  6:07 PM  Result Value Ref Range Status   Specimen Description URINE, RANDOM  Final   Special Requests NONE  Final   Culture MULTIPLE SPECIES PRESENT, SUGGEST RECOLLECTION (A)  Final   Report Status 03/05/2016 FINAL  Final    Radiology Reports Dg Chest 2 View  Result Date: 03/04/2016 CLINICAL DATA:  Leukocytosis. EXAM: CHEST  2 VIEW COMPARISON:  None. FINDINGS: Small bilateral effusions. The heart, hila, an mediastinum are unremarkable. No pulmonary nodules or masses. No focal infiltrates. Mild pleural thickening laterally in the right chest, likely chronic. No other acute abnormalities. IMPRESSION: Small effusions and underlying atelectasis. Electronically Signed   By: Gerome Sam III M.D   On: 03/04/2016 11:29  Ct Abdomen  Pelvis W Contrast  Result Date: 03/04/2016 CLINICAL DATA:  82 year old female with abdominal pain and distention and severe constipation. EXAM: CT ABDOMEN AND PELVIS WITH CONTRAST TECHNIQUE: Multidetector CT imaging of the abdomen and pelvis was performed using the standard protocol following bolus administration of intravenous contrast. CONTRAST:  80mL ISOVUE-300 IOPAMIDOL (ISOVUE-300) INJECTION 61% COMPARISON:  03/04/2016 FINDINGS: Lower chest: Mild cardiomegaly noted. A moderate hiatal hernia is noted. There is filling defect within this hernia which could represent food but difficult to exclude a mass. Very small bilateral pleural effusions and mild bibasilar atelectasis noted. Hepatobiliary: The liver and gallbladder are unremarkable. There is no evidence of biliary dilatation. Pancreas: Atrophic without other significant abnormality. Spleen: Unremarkable Adrenals/Urinary Tract: Bilateral renal atrophy identified. There is no evidence of hydronephrosis. The adrenal glands and bladder are unremarkable. Stomach/Bowel: A very large amount of stool is noted within the cecum, ascending colon, transverse colon and proximal descending colon. Gas and fluid within the cecum is also identified which measures up to 9.5 cm in greatest diameter. There is a relative transition point in the mid descending colon without definite colonic abnormality. No wall thickening is identified. There is no evidence of small bowel dilatation. Vascular/Lymphatic: Aortic atherosclerotic calcifications noted without aneurysm. No enlarged lymph nodes are identified. Reproductive: The patient is status post hysterectomy. No adnexal masses identified. Other: No ascites, pneumoperitoneum or abscess. Musculoskeletal: Degenerative changes within the lumbar spine identified. 25% superior endplate compression fracture of L1 is age indeterminate. Mild bony retropulsion noted. IMPRESSION: Distended cecum, ascending colon, transverse colon and  proximal descending colon filled with large amount of stool. Relative transition point in the mid descending colon without definite colonic abnormality. Consider direct inspection. No evidence of bowel wall thickening, small bowel obstruction or pneumoperitoneum. Moderate hiatal hernia with filling defect, which could represent feet but a mass is not entirely excluded. Consider direct inspection as indicated. 25% superior endplate compression fracture of L1- indeterminate. Very small bilateral pleural effusions with bibasilar atelectasis. Cardiomegaly. Electronically Signed   By: Harmon Pier M.D.   On: 03/04/2016 16:36  Dg Abd 2 Views  Result Date: 03/07/2016 CLINICAL DATA:  Abdominal discomfort EXAM: ABDOMEN - 2 VIEW COMPARISON:  03/06/2016 FINDINGS: Nasogastric catheter is again seen and stable. Scattered large and small bowel gas is noted. Persistent dilatation is noted in the colon on the right although the degree of dilatation has improved significantly now 10.7 cm in the right mid abdomen. No free air is seen. No bony abnormality is noted. IMPRESSION: Mild persistent right colonic dilatation although significantly improved from the prior exam. Electronically Signed   By: Alcide Clever M.D.   On: 03/07/2016 08:02   Dg Abd 2 Views  Result Date: 03/06/2016 CLINICAL DATA:  82 year old female with obstipation. Subsequent encounter. EXAM: ABDOMEN - 2 VIEW COMPARISON:  03/05/2016 plain film exam.  03/04/2016 FINDINGS: Nasogastric tube is in place. Tip projects over the left upper quadrant. Side hole just below the diaphragm level. Patient has a hiatal hernia as noted on recent CT. Gas distended colon. Dilated cecum difficult adequately measure as there may be a superior medial displacement of the cecum. Using similar landmarks to recent plain film examination. Dilated proximal right colon/cecum now spanning over 19.2 cm versus prior 14.3 cm. Progressive distention of gas distended transverse colon measuring up  to 8.2 cm versus prior 6.7 cm. Cannot exclude obstructing lesion.  Please see recent CT report. No free intraperitoneal air noted on decubitus view. IMPRESSION: Progressive distension of the right colon/cecum and transverse colon as noted above. Electronically Signed   By: Lacy Duverney M.D.   On: 03/06/2016 08:00  Dg Abd 2 Views  Result Date: 03/05/2016 CLINICAL DATA:  Obstipation EXAM: ABDOMEN - 2 VIEW COMPARISON:  CT 03/04/2016 FINDINGS: Diffuse gaseous distention of the colon, most pronounced involving the cecum. Cecum measures approximately 14 cm in diameter. When the cecal diameter was measured on the scout image on prior study, this measured 13 cm. No free air organomegaly. IMPRESSION: Marked gaseous distention of the colon, particularly cecum. This is similar to prior CT. Electronically Signed   By: Charlett Nose M.D.   On: 03/05/2016 09:05  Dg Abd 2 Views  Result Date: 03/04/2016 CLINICAL DATA:  Leukocytosis and constipation EXAM: ABDOMEN - 2 VIEW COMPARISON:  None. FINDINGS: No free air, portal venous gas, or pneumatosis. Small effusions in the lung bases. Markedly dilated colon primarily in the right lower abdomen with air-fluid levels on the upright view. The most dilated loop measures up to 11.7 cm increasing the risk of perforation. The more distal loops of colon are relatively decompressed but air does extend into the rectum. No pneumatosis. No other acute abnormalities. IMPRESSION: There is a markedly dilated loop of colon in the right side of the abdomen. A sigmoid volvulus is not excluded. Recommend a CT scan for further evaluation. Findings being called to Dr. Maisie Fus. Electronically Signed   By: Gerome Sam III M.D   On: 03/04/2016 11:39  Dg Basil Dess Tube Plc W/fl W/rad  Result Date: 03/05/2016 CLINICAL DATA:  Colonic distention. Request for nasogastric tube placement. Difficult to place a nasogastric tube on the floor. EXAM: NASO G TUBE PLACEMENT WITH FL AND WITH RAD CONTRAST:  28 mL  of Gastrografin injected through nasogastric tube FLUOROSCOPY TIME:  If the device does not provide the exposure index: Fluoroscopy Time (in minutes and seconds):  1 minutes and 6 seconds Number of Acquired Images:  2 COMPARISON:  Abdominal CT 03/04/2016 FINDINGS: Both nostrils anesthetized with viscous lidocaine. 14 French nasogastric tube would not easily advance through the right  nostril. The tube easily advanced through the left nostril. Initially, the tube appeared to advance into the airway. The tube was pulled back and directed into the esophagus. Tube was advanced into the upper abdomen. Contrast injection confirmed placement in the stomach. IMPRESSION: Successful placement of a nasogastric tube with fluoroscopy. Catheter tip in the stomach. Electronically Signed   By: Richarda Overlie M.D.   On: 03/05/2016 14:36  Dg Colon W/water Sol Cm  Result Date: 03/08/2016 CLINICAL DATA:  Colonic obstruction. EXAM: WATER SOLUBLE CONTRAST ENEMA TECHNIQUE: Enema was performed using water-soluble contrast. FLUOROSCOPY TIME:  Radiation Exposure Index (as provided by the fluoroscopic device): 2 minutes If the device does not provide the exposure index: Fluoroscopy Time:  dictate in minutes and seconds Number of Acquired Images:  6 COMPARISON:  CT 03/04/2016.  Plain films 03/07/2016. FINDINGS: There is a slow gradual caliber change of the colon from the sigmoid colon into the descending colon. Mild distention of the transverse colon PICC. Cecum is moderately distended with gas. There is no colonic obstruction noted. Scattered sigmoid diverticula. IMPRESSION: No evidence of colonic obstruction. Scattered sigmoid diverticulosis. Electronically Signed   By: Charlett Nose M.D.   On: 03/08/2016 15:33    Lab Data:  CBC:  Recent Labs Lab 03/04/16 0502 03/05/16 0759 03/06/16 0621 03/07/16 0602 03/08/16 1013  WBC 15.4* 13.2* 16.4* 9.9 9.9  NEUTROABS  --  9.5*  --   --   --   HGB 11.9* 11.0* 11.4* 9.8* 11.0*  HCT 36.1  33.5* 35.4* 29.6* 32.7*  MCV 87.6 88.6 89.2 88.6 88.1  PLT 381 378 417* 373 338   Basic Metabolic Panel:  Recent Labs Lab 03/04/16 0502 03/05/16 0532 03/05/16 0759 03/06/16 0621 03/07/16 0602 03/08/16 0909  NA 132*  --  134* 135 133* 131*  K 3.3*  --  4.0 4.3 3.8 3.5  CL 96*  --  102 103 100* 96*  CO2 22  --  24 21* 24 23  GLUCOSE 104*  --  99 89 66 98  BUN 22*  --  26* 26* 15 8  CREATININE 0.97  --  0.84 0.87 0.72 0.74  CALCIUM 10.6*  --  10.1 10.6* 9.8 9.8  MG 1.5* 2.1  --  1.7 1.6* 1.5*   GFR: Estimated Creatinine Clearance: 37.7 mL/min (by C-G formula based on SCr of 0.8 mg/dL). Liver Function Tests: No results for input(s): AST, ALT, ALKPHOS, BILITOT, PROT, ALBUMIN in the last 168 hours. No results for input(s): LIPASE, AMYLASE in the last 168 hours. No results for input(s): AMMONIA in the last 168 hours. Coagulation Profile: No results for input(s): INR, PROTIME in the last 168 hours. Cardiac Enzymes: No results for input(s): CKTOTAL, CKMB, CKMBINDEX, TROPONINI in the last 168 hours. BNP (last 3 results) No results for input(s): PROBNP in the last 8760 hours. HbA1C: No results for input(s): HGBA1C in the last 72 hours. CBG: No results for input(s): GLUCAP in the last 168 hours. Lipid Profile: No results for input(s): CHOL, HDL, LDLCALC, TRIG, CHOLHDL, LDLDIRECT in the last 72 hours. Thyroid Function Tests: No results for input(s): TSH, T4TOTAL, FREET4, T3FREE, THYROIDAB in the last 72 hours. Anemia Panel: No results for input(s): VITAMINB12, FOLATE, FERRITIN, TIBC, IRON, RETICCTPCT in the last 72 hours. Urine analysis:    Component Value Date/Time   COLORURINE AMBER (A) 03/04/2016 0050   APPEARANCEUR CLOUDY (A) 03/04/2016 0050   LABSPEC 1.022 03/04/2016 0050   PHURINE 6.0 03/04/2016 0050   GLUCOSEU NEGATIVE 03/04/2016 0050  HGBUR SMALL (A) 03/04/2016 0050   BILIRUBINUR SMALL (A) 03/04/2016 0050   KETONESUR 15 (A) 03/04/2016 0050   PROTEINUR 30 (A)  03/04/2016 0050   NITRITE NEGATIVE 03/04/2016 0050   LEUKOCYTESUR MODERATE (A) 03/04/2016 0050     Zanna Hawn M.D. [facility] 03/10/2016, 12:21 PM  Pager: [phone] Between 7am to 7pm - call Pager - [phone]  After 7pm go to [email or web address] - password [email or web address]  Call night coverage person covering after 7pm

## 2016-03-11 ENCOUNTER — Encounter (HOSPITAL_COMMUNITY): Payer: Self-pay | Admitting: Surgery

## 2016-03-11 DIAGNOSIS — L8990 Pressure ulcer of unspecified site, unspecified stage: Secondary | ICD-10-CM

## 2016-03-11 LAB — BASIC METABOLIC PANEL
Anion gap: 13 (ref 5–15)
BUN: 8 mg/dL (ref 6–20)
CHLORIDE: 89 mmol/L — AB (ref 101–111)
CO2: 30 mmol/L (ref 22–32)
CREATININE: 0.72 mg/dL (ref 0.44–1.00)
Calcium: 10.5 mg/dL — ABNORMAL HIGH (ref 8.9–10.3)
GFR calc non Af Amer: >60 mL/min (ref >60)
Glucose, Bld: 91 mg/dL (ref 65–99)
Potassium: 2.8 mmol/L — ABNORMAL LOW (ref 3.5–5.1)
Sodium: 132 mmol/L — ABNORMAL LOW (ref 135–145)

## 2016-03-11 LAB — CBC
HEMATOCRIT: 34.3 % — AB (ref 36.0–46.0)
HEMOGLOBIN: 11.7 g/dL — AB (ref 12.0–15.0)
MCH: 29.3 pg (ref 26.0–34.0)
MCHC: 34.1 g/dL (ref 30.0–36.0)
MCV: 86 fL (ref 78.0–100.0)
Platelets: 407 10*3/uL — ABNORMAL HIGH (ref 150–400)
RBC: 3.99 MIL/uL (ref 3.87–5.11)
RDW: 12.5 % (ref 11.5–15.5)
WBC: 8.6 10*3/uL (ref 4.0–10.5)

## 2016-03-11 MED ORDER — POLYETHYLENE GLYCOL 3350 17 G PO PACK
17.0000 g | PACK | Freq: Three times a day (TID) | ORAL | Status: DC
  Administered 2016-03-11 – 2016-03-16 (×14): 17 g via ORAL
  Filled 2016-03-11 (×15): qty 1

## 2016-03-11 MED ORDER — SENNOSIDES-DOCUSATE SODIUM 8.6-50 MG PO TABS
1.0000 | ORAL_TABLET | Freq: Two times a day (BID) | ORAL | Status: DC
  Administered 2016-03-11 – 2016-03-15 (×10): 1 via ORAL
  Filled 2016-03-11 (×10): qty 1

## 2016-03-11 MED ORDER — BISACODYL 10 MG RE SUPP
10.0000 mg | Freq: Once | RECTAL | Status: AC
  Administered 2016-03-11: 10 mg via RECTAL
  Filled 2016-03-11: qty 1

## 2016-03-11 MED ORDER — HYDROMORPHONE HCL 1 MG/ML IJ SOLN
0.5000 mg | Freq: Once | INTRAMUSCULAR | Status: AC
  Administered 2016-03-11: 0.5 mg via INTRAVENOUS
  Filled 2016-03-11: qty 1

## 2016-03-11 NOTE — Progress Notes (Signed)
Patient c/o severe pain. MD notified. New orders received. Madelin Rear, MSN, RN, [employer]

## 2016-03-11 NOTE — Progress Notes (Signed)
  Request seen for evaluation for vertebral augmentation.  Dr. Corliss Skains will review MRI on Monday and determine if patient is a candidate for the procedure.  WENDY S BLAIR PA-C 03/11/2016 9:05 AM

## 2016-03-11 NOTE — Progress Notes (Signed)
[facility]                                                                              Patient Demographics  Tara Frederick, is a 82 y.o. female, DOB - September 07, 1923, ZOX:[id number]  Admit date - 03/03/2016   Admitting Physician Starleen Arms, MD  Outpatient Primary MD for the patient is No primary care provider on file.  Outpatient specialists:   LOS - 8  days    No chief complaint on file.      Brief summary   Tara Frederick a 82 y.o.femalewith medical history significant of HTN, GERD, degenerative disk disease. Patient given narcotics in ED last week for back pain. Since then she has had severe constipation. Took narcotic last on Sunday. No BM since then. Presented to [facility] 3 days ago. Was admitted. CT scan at that time showed severe constipation. Attempts made to treat medically include: 2L golytely, miralax BID, dulcolax 20mg  PO daily, senna, multiple fleet enemas, and 2 attempts at manual disimpaction (no stool in rectum). All of the above attempts to treat medically have failed, and today patient finally developed nausea and ultimately numerous episodes of vomiting on ambulance ride down here. Patient was admitted given oral MiraLAX and some enemas with no significant improvement. Abdominal films showed worsening cecal dilatation. GI was consulted. A G-tube placed on the fluoroscopy. Patient given every 2 hour MiraLAX and tap water enemas. Patient finally with multiple bowel movements. Patient for Gastrografin enema tomorrow 03/08/2016.    Assessment & Plan    Severe obstipation - Patient's last bowel movement was July 12 after receiving narcotic pain medications for back injury, Presented with worsening abdominal distention and inability to have bowel movements despite multiple laxatives and multiple Fleet enemas as well as an attempt at manual disimpaction with no stool noted in the rectal vault the outside hospital.  -  Plain films of the  abdomen with a markedly dilated loop of colon in the right side of the abdomen measuring approximately 11.7 cm. Increasing risk for perforation. Concern for possible sigmoid volvulus. -  CT abdomen and pelvis done 03/04/2016 with a distended cecum measuring 9.5 cm, ascending colon transverse colon and proximal descending colon filled with large amount of stool. 25% superior endplate compression fracture of L1 indeterminate.  - multiple attempts at NG tube placement the night of 03/04/2016 was unsuccessful. Patient also had multiple tap water enemas were no results. NG tube placed under fluoroscopy per radiology 03/05/2016. NG tube placed to suction.  - Subsequently, patient had multiple BMs on 7/30-8/1 while on every 2 hours MiraLAX with improvement with abdominal distention and abdominal pain. Abdominal x-rays with improvement. Significant clinical improvement. NG tube was clamped - Patient was started on clears, GI has been following closely.  - Gastrografin enema on 8/2 negative for any obstructing lesions - Patient tolerating solid diet - States no BM for last 2 days, placed on MiraLAX TID, Senokot-S and Dulcolax. If no BM by tomorrow, add amitiza or linzess   Severe back pain Patient complaining of low back pain, stated that this is what led to her original complaint of severe constipation  when patient was started on pain medications - placed on tramadol, Toradol and Flexeril -MRI L spine showed subacute compression fracture of L1, d/w patient's daughter, agreed for kyphoplasty. IR to eval.    probable UTI Urine cultures with multiple morphotypes. Continue IV Levaquin and treat for total of 5-7 days.  leukocytosis Likely secondary to problem #2 and reactive leukocytosis. - Currently no leukocytosis, urine cultures with multiple morphotypes. Continue empiric IV Levaquin.  GERD Continue PPI   hypertension Continue Norvasc.   Code Status: Full  DVT Prophylaxis:  Lovenox  Family  Communication: Discussed in detail with the patient, all imaging results, lab results explained to the patient  And daughter yesterday   Disposition Plan:   Time Spent in minutes 25 minutes  Procedures:   CXR 03/04/2016  KUB 03/04/2016  CT abdomen and pelvis 03/04/2016  NG tube placement under fluoroscopy 03/05/2016  Consultants:   Gastroenterology surgery  Antimicrobials:   IV levaquin   Medications  Scheduled Meds: . acetaminophen  1,000 mg Oral TID  . amLODipine  5 mg Oral Daily  . cyclobenzaprine  5 mg Oral TID  . enoxaparin (LOVENOX) injection  40 mg Subcutaneous QHS  . latanoprost  1 drop Left Eye QHS  . lidocaine  1 patch Transdermal Q24H  . naproxen  250 mg Oral BID WC  . pantoprazole  40 mg Oral Q0600  . polyethylene glycol  17 g Oral TID  . senna-docusate  1 tablet Oral BID   Continuous Infusions:   PRN Meds:.acetaminophen, ketorolac, ondansetron (ZOFRAN) IV, phenol, prochlorperazine   Antibiotics   Anti-infectives    Start     Dose/Rate Route Frequency Ordered Stop   03/08/16 0800  levofloxacin (LEVAQUIN) IVPB 750 mg  Status:  Discontinued     750 mg 100 mL/hr over 90 Minutes Intravenous Every 48 hours 03/06/16 1029 03/11/16 1013   03/04/16 0800  levofloxacin (LEVAQUIN) IVPB 750 mg  Status:  Discontinued     750 mg 100 mL/hr over 90 Minutes Intravenous Every 24 hours 03/03/16 2221 03/06/16 1029        Subjective:   Tara Frederick was seen and examined today. Back pain better. States no BM x2 days. No abdominal distention. No nausea or vomiting. No fevers or chills.  Patient denies dizziness, chest pain, shortness of breath, new weakness, numbess, tingling. No acute events overnight.    Objective:   Vitals:   03/10/16 0506 03/10/16 1401 03/10/16 2044 03/11/16 0449  BP: (!) 142/83 135/71 137/66 130/68  Pulse: (!) 103 (!) 101 100 (!) 101  Resp: 18 20 20 20   Temp: 98.1 F (36.7 C) 97.7 F (36.5 C) 98.2 F (36.8 C) 98.1 F (36.7 C)    TempSrc: Oral Oral    SpO2: 91% 94% 94% 93%  Weight:      Height:        Intake/Output Summary (Last 24 hours) at 03/11/16 1240 Last data filed at 03/11/16 0925  Gross per 24 hour  Intake              120 ml  Output              800 ml  Net             -680 ml     Wt Readings from Last 3 Encounters:  03/03/16 58.9 kg (129 lb 13.6 oz)     Exam  General: Alert and oriented x 3, NAD  HEENT:    Neck:   Cardiovascular:  S1 S2 auscultated, no rubs, murmurs or gallops. Regular rate and rhythm.  Respiratory: Clear to auscultation bilaterally, no wheezing, rales or rhonchi  Gastrointestinal: Soft, nontender, nondistended, hypoactive bowel sounds  Ext: no cyanosis clubbing or edema  Neuro:   Skin: No rashes  Psych: Normal affect and demeanor, alert and oriented x3    Data Reviewed:  I have personally reviewed following labs and imaging studies  Micro Results Recent Results (from the past 240 hour(s))  Urine culture     Status: Abnormal   Collection Time: 03/04/16  6:07 PM  Result Value Ref Range Status   Specimen Description URINE, RANDOM  Final   Special Requests NONE  Final   Culture MULTIPLE SPECIES PRESENT, SUGGEST RECOLLECTION (A)  Final   Report Status 03/05/2016 FINAL  Final    Radiology Reports Dg Chest 2 View  Result Date: 03/04/2016 CLINICAL DATA:  Leukocytosis. EXAM: CHEST  2 VIEW COMPARISON:  None. FINDINGS: Small bilateral effusions. The heart, hila, an mediastinum are unremarkable. No pulmonary nodules or masses. No focal infiltrates. Mild pleural thickening laterally in the right chest, likely chronic. No other acute abnormalities. IMPRESSION: Small effusions and underlying atelectasis. Electronically Signed   By: Gerome Sam III M.D   On: 03/04/2016 11:29  Mr Lumbar Spine Wo Contrast  Result Date: 03/10/2016 CLINICAL DATA:  Low back and bilateral hip pain. Status post blow to the back on a commode yesterday. Initial encounter. EXAM: MRI LUMBAR  SPINE WITHOUT CONTRAST TECHNIQUE: Multiplanar, multisequence MR imaging of the lumbar spine was performed. No intravenous contrast was administered. COMPARISON:  CT abdomen and pelvis 03/04/2016. FINDINGS: Segmentation:  Unremarkable. Alignment: Trace anterolisthesis L3 on L4 is identified. Otherwise unremarkable. Vertebrae: The patient has a subacute fracture of L1 with vertebral body height loss of up to 70% centrally. Marrow edema is seen within the vertebral body. The fracture is identified on the prior CT. No new fracture. Scattered degenerative endplate signal change is seen. Conus medullaris: Extends to the L2 level and appears normal. Paraspinal and other soft tissues: Unremarkable. Disc levels: T11-12 is imaged in the sagittal plane only and negative. T12-L1: There is some bony retropulsion off the superior endplate of L1 but the central canal and foramina are open. L1-2:  Mild ligamentum flavum thickening.  Otherwise negative. L2-3: Ligamentum flavum thickening and a minimal disc bulge are seen. There is facet degenerative disease. The central canal and foramina are open. L3-4: Shallow disc bulge with a superimposed small central protrusion. There is some ligamentum flavum thickening. The central canal and foramina are open. L4-5: Left laminotomy defect is identified. There is a shallow disc bulge and superimposed left lateral recess protrusion. Disc contacts the left L5 root in lateral recess without compression or displacement. The thecal sac is widely patent. Mild right foraminal narrowing is noted. The left foramen is open. L5-S1: Small right lateral recess and foraminal protrusion without stenosis. IMPRESSION: Superior endplate compression fracture of L1 with up to 70% vertebral body height loss is subacute. The fracture is present on the prior CT scan from 03/04/2016. There is no acute fracture. Status post left laminotomy at L4-5. Disc contacts the descending left L5 root in the lateral recess  without compression or displacement. Mild right foraminal narrowing is seen at this level. Please see above for description of other levels. Electronically Signed   By: Drusilla Kanner M.D.   On: 03/10/2016 15:50   Ct Abdomen Pelvis W Contrast  Result Date: 03/04/2016 CLINICAL DATA:  82 year old female with  abdominal pain and distention and severe constipation. EXAM: CT ABDOMEN AND PELVIS WITH CONTRAST TECHNIQUE: Multidetector CT imaging of the abdomen and pelvis was performed using the standard protocol following bolus administration of intravenous contrast. CONTRAST:  80mL ISOVUE-300 IOPAMIDOL (ISOVUE-300) INJECTION 61% COMPARISON:  03/04/2016 FINDINGS: Lower chest: Mild cardiomegaly noted. A moderate hiatal hernia is noted. There is filling defect within this hernia which could represent food but difficult to exclude a mass. Very small bilateral pleural effusions and mild bibasilar atelectasis noted. Hepatobiliary: The liver and gallbladder are unremarkable. There is no evidence of biliary dilatation. Pancreas: Atrophic without other significant abnormality. Spleen: Unremarkable Adrenals/Urinary Tract: Bilateral renal atrophy identified. There is no evidence of hydronephrosis. The adrenal glands and bladder are unremarkable. Stomach/Bowel: A very large amount of stool is noted within the cecum, ascending colon, transverse colon and proximal descending colon. Gas and fluid within the cecum is also identified which measures up to 9.5 cm in greatest diameter. There is a relative transition point in the mid descending colon without definite colonic abnormality. No wall thickening is identified. There is no evidence of small bowel dilatation. Vascular/Lymphatic: Aortic atherosclerotic calcifications noted without aneurysm. No enlarged lymph nodes are identified. Reproductive: The patient is status post hysterectomy. No adnexal masses identified. Other: No ascites, pneumoperitoneum or abscess. Musculoskeletal:  Degenerative changes within the lumbar spine identified. 25% superior endplate compression fracture of L1 is age indeterminate. Mild bony retropulsion noted. IMPRESSION: Distended cecum, ascending colon, transverse colon and proximal descending colon filled with large amount of stool. Relative transition point in the mid descending colon without definite colonic abnormality. Consider direct inspection. No evidence of bowel wall thickening, small bowel obstruction or pneumoperitoneum. Moderate hiatal hernia with filling defect, which could represent feet but a mass is not entirely excluded. Consider direct inspection as indicated. 25% superior endplate compression fracture of L1- indeterminate. Very small bilateral pleural effusions with bibasilar atelectasis. Cardiomegaly. Electronically Signed   By: Harmon Pier M.D.   On: 03/04/2016 16:36  Dg Abd 2 Views  Result Date: 03/07/2016 CLINICAL DATA:  Abdominal discomfort EXAM: ABDOMEN - 2 VIEW COMPARISON:  03/06/2016 FINDINGS: Nasogastric catheter is again seen and stable. Scattered large and small bowel gas is noted. Persistent dilatation is noted in the colon on the right although the degree of dilatation has improved significantly now 10.7 cm in the right mid abdomen. No free air is seen. No bony abnormality is noted. IMPRESSION: Mild persistent right colonic dilatation although significantly improved from the prior exam. Electronically Signed   By: Alcide Clever M.D.   On: 03/07/2016 08:02   Dg Abd 2 Views  Result Date: 03/06/2016 CLINICAL DATA:  82 year old female with obstipation. Subsequent encounter. EXAM: ABDOMEN - 2 VIEW COMPARISON:  03/05/2016 plain film exam.  03/04/2016 FINDINGS: Nasogastric tube is in place. Tip projects over the left upper quadrant. Side hole just below the diaphragm level. Patient has a hiatal hernia as noted on recent CT. Gas distended colon. Dilated cecum difficult adequately measure as there may be a superior medial displacement  of the cecum. Using similar landmarks to recent plain film examination. Dilated proximal right colon/cecum now spanning over 19.2 cm versus prior 14.3 cm. Progressive distention of gas distended transverse colon measuring up to 8.2 cm versus prior 6.7 cm. Cannot exclude obstructing lesion.  Please see recent CT report. No free intraperitoneal air noted on decubitus view. IMPRESSION: Progressive distension of the right colon/cecum and transverse colon as noted above. Electronically Signed   By: Lacy Duverney  M.D.   On: 03/06/2016 08:00  Dg Abd 2 Views  Result Date: 03/05/2016 CLINICAL DATA:  Obstipation EXAM: ABDOMEN - 2 VIEW COMPARISON:  CT 03/04/2016 FINDINGS: Diffuse gaseous distention of the colon, most pronounced involving the cecum. Cecum measures approximately 14 cm in diameter. When the cecal diameter was measured on the scout image on prior study, this measured 13 cm. No free air organomegaly. IMPRESSION: Marked gaseous distention of the colon, particularly cecum. This is similar to prior CT. Electronically Signed   By: Charlett Nose M.D.   On: 03/05/2016 09:05  Dg Abd 2 Views  Result Date: 03/04/2016 CLINICAL DATA:  Leukocytosis and constipation EXAM: ABDOMEN - 2 VIEW COMPARISON:  None. FINDINGS: No free air, portal venous gas, or pneumatosis. Small effusions in the lung bases. Markedly dilated colon primarily in the right lower abdomen with air-fluid levels on the upright view. The most dilated loop measures up to 11.7 cm increasing the risk of perforation. The more distal loops of colon are relatively decompressed but air does extend into the rectum. No pneumatosis. No other acute abnormalities. IMPRESSION: There is a markedly dilated loop of colon in the right side of the abdomen. A sigmoid volvulus is not excluded. Recommend a CT scan for further evaluation. Findings being called to Dr. Maisie Fus. Electronically Signed   By: Gerome Sam III M.D   On: 03/04/2016 11:39  Dg Basil Dess Tube Plc W/fl  W/rad  Result Date: 03/05/2016 CLINICAL DATA:  Colonic distention. Request for nasogastric tube placement. Difficult to place a nasogastric tube on the floor. EXAM: NASO G TUBE PLACEMENT WITH FL AND WITH RAD CONTRAST:  28 mL of Gastrografin injected through nasogastric tube FLUOROSCOPY TIME:  If the device does not provide the exposure index: Fluoroscopy Time (in minutes and seconds):  1 minutes and 6 seconds Number of Acquired Images:  2 COMPARISON:  Abdominal CT 03/04/2016 FINDINGS: Both nostrils anesthetized with viscous lidocaine. 14 French nasogastric tube would not easily advance through the right nostril. The tube easily advanced through the left nostril. Initially, the tube appeared to advance into the airway. The tube was pulled back and directed into the esophagus. Tube was advanced into the upper abdomen. Contrast injection confirmed placement in the stomach. IMPRESSION: Successful placement of a nasogastric tube with fluoroscopy. Catheter tip in the stomach. Electronically Signed   By: Richarda Overlie M.D.   On: 03/05/2016 14:36  Dg Colon W/water Sol Cm  Result Date: 03/08/2016 CLINICAL DATA:  Colonic obstruction. EXAM: WATER SOLUBLE CONTRAST ENEMA TECHNIQUE: Enema was performed using water-soluble contrast. FLUOROSCOPY TIME:  Radiation Exposure Index (as provided by the fluoroscopic device): 2 minutes If the device does not provide the exposure index: Fluoroscopy Time:  dictate in minutes and seconds Number of Acquired Images:  6 COMPARISON:  CT 03/04/2016.  Plain films 03/07/2016. FINDINGS: There is a slow gradual caliber change of the colon from the sigmoid colon into the descending colon. Mild distention of the transverse colon PICC. Cecum is moderately distended with gas. There is no colonic obstruction noted. Scattered sigmoid diverticula. IMPRESSION: No evidence of colonic obstruction. Scattered sigmoid diverticulosis. Electronically Signed   By: Charlett Nose M.D.   On: 03/08/2016 15:33    Lab  Data:  CBC:  Recent Labs Lab 03/05/16 0759 03/06/16 0621 03/07/16 0602 03/08/16 1013 03/11/16 0521  WBC 13.2* 16.4* 9.9 9.9 8.6  NEUTROABS 9.5*  --   --   --   --   HGB 11.0* 11.4* 9.8* 11.0* 11.7*  HCT 33.5* 35.4* 29.6* 32.7* 34.3*  MCV 88.6 89.2 88.6 88.1 86.0  PLT 378 417* 373 338 407*   Basic Metabolic Panel:  Recent Labs Lab 03/05/16 0532 03/05/16 0759 03/06/16 0621 03/07/16 0602 03/08/16 0909 03/11/16 0521  NA  --  134* 135 133* 131* 132*  K  --  4.0 4.3 3.8 3.5 2.8*  CL  --  102 103 100* 96* 89*  CO2  --  24 21* 24 23 30   GLUCOSE  --  99 89 66 98 91  BUN  --  26* 26* 15 8 8   CREATININE  --  0.84 0.87 0.72 0.74 0.72  CALCIUM  --  10.1 10.6* 9.8 9.8 10.5*  MG 2.1  --  1.7 1.6* 1.5*  --    GFR: Estimated Creatinine Clearance: 37.7 mL/min (by C-G formula based on SCr of 0.8 mg/dL). Liver Function Tests: No results for input(s): AST, ALT, ALKPHOS, BILITOT, PROT, ALBUMIN in the last 168 hours. No results for input(s): LIPASE, AMYLASE in the last 168 hours. No results for input(s): AMMONIA in the last 168 hours. Coagulation Profile: No results for input(s): INR, PROTIME in the last 168 hours. Cardiac Enzymes: No results for input(s): CKTOTAL, CKMB, CKMBINDEX, TROPONINI in the last 168 hours. BNP (last 3 results) No results for input(s): PROBNP in the last 8760 hours. HbA1C: No results for input(s): HGBA1C in the last 72 hours. CBG: No results for input(s): GLUCAP in the last 168 hours. Lipid Profile: No results for input(s): CHOL, HDL, LDLCALC, TRIG, CHOLHDL, LDLDIRECT in the last 72 hours. Thyroid Function Tests: No results for input(s): TSH, T4TOTAL, FREET4, T3FREE, THYROIDAB in the last 72 hours. Anemia Panel: No results for input(s): VITAMINB12, FOLATE, FERRITIN, TIBC, IRON, RETICCTPCT in the last 72 hours. Urine analysis:    Component Value Date/Time   COLORURINE AMBER (A) 03/04/2016 0050   APPEARANCEUR CLOUDY (A) 03/04/2016 0050   LABSPEC 1.022  03/04/2016 0050   PHURINE 6.0 03/04/2016 0050   GLUCOSEU NEGATIVE 03/04/2016 0050   HGBUR SMALL (A) 03/04/2016 0050   BILIRUBINUR SMALL (A) 03/04/2016 0050   KETONESUR 15 (A) 03/04/2016 0050   PROTEINUR 30 (A) 03/04/2016 0050   NITRITE NEGATIVE 03/04/2016 0050   LEUKOCYTESUR MODERATE (A) 03/04/2016 0050     RAI,RIPUDEEP M.D. [facility] 03/11/2016, 12:40 PM  Pager: [phone] Between 7am to 7pm - call Pager - [phone]  After 7pm go to [email or web address] - password [email or web address]  Call night coverage person covering after 7pm

## 2016-03-12 ENCOUNTER — Encounter (HOSPITAL_COMMUNITY): Payer: Self-pay | Admitting: *Deleted

## 2016-03-12 LAB — BASIC METABOLIC PANEL
Anion gap: 12 (ref 5–15)
BUN: 21 mg/dL — ABNORMAL HIGH (ref 6–20)
CHLORIDE: 88 mmol/L — AB (ref 101–111)
CO2: 30 mmol/L (ref 22–32)
CREATININE: 1.22 mg/dL — AB (ref 0.44–1.00)
Calcium: 10.2 mg/dL (ref 8.9–10.3)
GFR calc non Af Amer: 38 mL/min — ABNORMAL LOW (ref >60)
GFR, EST AFRICAN AMERICAN: 44 mL/min — AB (ref >60)
Glucose, Bld: 101 mg/dL — ABNORMAL HIGH (ref 65–99)
Potassium: 2.8 mmol/L — ABNORMAL LOW (ref 3.5–5.1)
Sodium: 130 mmol/L — ABNORMAL LOW (ref 135–145)

## 2016-03-12 LAB — CBC
HEMATOCRIT: 32.3 % — AB (ref 36.0–46.0)
HEMOGLOBIN: 11 g/dL — AB (ref 12.0–15.0)
MCH: 28.9 pg (ref 26.0–34.0)
MCHC: 34.1 g/dL (ref 30.0–36.0)
MCV: 84.8 fL (ref 78.0–100.0)
Platelets: 314 10*3/uL (ref 150–400)
RBC: 3.81 MIL/uL — ABNORMAL LOW (ref 3.87–5.11)
RDW: 12.8 % (ref 11.5–15.5)
WBC: 10.1 10*3/uL (ref 4.0–10.5)

## 2016-03-12 LAB — APTT: aPTT: 41 seconds — ABNORMAL HIGH (ref 24–36)

## 2016-03-12 LAB — PROTIME-INR
INR: 1.16
Prothrombin Time: 14.9 seconds (ref 11.4–15.2)

## 2016-03-12 MED ORDER — HYDROMORPHONE HCL 1 MG/ML IJ SOLN
0.5000 mg | INTRAMUSCULAR | Status: DC | PRN
  Administered 2016-03-12 – 2016-03-15 (×9): 0.5 mg via INTRAVENOUS
  Filled 2016-03-12 (×10): qty 1

## 2016-03-12 MED ORDER — POTASSIUM CHLORIDE 10 MEQ/100ML IV SOLN
10.0000 meq | INTRAVENOUS | Status: AC
  Administered 2016-03-12 (×3): 10 meq via INTRAVENOUS
  Filled 2016-03-12 (×2): qty 100

## 2016-03-12 MED ORDER — HEPARIN SODIUM (PORCINE) 5000 UNIT/ML IJ SOLN
5000.0000 [IU] | Freq: Three times a day (TID) | INTRAMUSCULAR | Status: DC
  Administered 2016-03-12 – 2016-03-13 (×3): 5000 [IU] via SUBCUTANEOUS
  Filled 2016-03-12 (×3): qty 1

## 2016-03-12 MED ORDER — POTASSIUM CHLORIDE CRYS ER 20 MEQ PO TBCR
40.0000 meq | EXTENDED_RELEASE_TABLET | ORAL | Status: AC
  Administered 2016-03-12 (×2): 40 meq via ORAL
  Filled 2016-03-12 (×2): qty 2

## 2016-03-12 MED ORDER — ACETAMINOPHEN 500 MG PO TABS
1000.0000 mg | ORAL_TABLET | Freq: Three times a day (TID) | ORAL | Status: AC
  Administered 2016-03-12 – 2016-03-14 (×6): 1000 mg via ORAL
  Filled 2016-03-12 (×6): qty 2

## 2016-03-12 MED ORDER — SODIUM CHLORIDE 0.9 % IV SOLN
INTRAVENOUS | Status: DC
  Administered 2016-03-12 – 2016-03-16 (×4): via INTRAVENOUS

## 2016-03-12 NOTE — Progress Notes (Signed)
[facility]                                                                              Patient Demographics  Tara Frederick, is a 82 y.o. female, DOB - May 01, 1924, WUJ:[id number]  Admit date - 03/03/2016   Admitting Physician Starleen Arms, MD  Outpatient Primary MD for the patient is No primary care provider on file.  Outpatient specialists:   LOS - 9  days    No chief complaint on file.      Brief summary   Tara Frederick a 82 y.o.femalewith medical history significant of HTN, GERD, degenerative disk disease. Patient given narcotics in ED last week for back pain. Since then she has had severe constipation. Took narcotic last on Sunday. No BM since then. Presented to [facility] 3 days ago. Was admitted. CT scan at that time showed severe constipation. Attempts made to treat medically include: 2L golytely, miralax BID, dulcolax 20mg  PO daily, senna, multiple fleet enemas, and 2 attempts at manual disimpaction (no stool in rectum). All of the above attempts to treat medically have failed, and today patient finally developed nausea and ultimately numerous episodes of vomiting on ambulance ride down here. Patient was admitted given oral MiraLAX and some enemas with no significant improvement. Abdominal films showed worsening cecal dilatation. GI was consulted. A G-tube placed on the fluoroscopy. Patient given every 2 hour MiraLAX and tap water enemas. Patient finally with multiple bowel movements. Patient for Gastrografin enema tomorrow 03/08/2016.    Assessment & Plan    Severe obstipation - Patient's last bowel movement was July 12 after receiving narcotic pain medications for back injury, Presented with worsening abdominal distention and inability to have bowel movements despite multiple laxatives and multiple Fleet enemas as well as an attempt at manual disimpaction with no stool noted in the rectal vault the outside hospital.  -  Plain films of the  abdomen with a markedly dilated loop of colon in the right side of the abdomen measuring approximately 11.7 cm. Increasing risk for perforation. Concern for possible sigmoid volvulus. -  CT abdomen and pelvis done 03/04/2016 with a distended cecum measuring 9.5 cm, ascending colon transverse colon and proximal descending colon filled with large amount of stool. 25% superior endplate compression fracture of L1 indeterminate.  - multiple attempts at NG tube placement the night of 03/04/2016 was unsuccessful. Patient also had multiple tap water enemas were no results. NG tube placed under fluoroscopy per radiology 03/05/2016. NG tube placed to suction.  - Subsequently, patient had multiple BMs on 7/30-8/1 while on every 2 hours MiraLAX with improvement with abdominal distention and abdominal pain. Abdominal x-rays with improvement. Significant clinical improvement. NG tube was clamped - Patient was started on clears, GI has been following closely.  - Gastrografin enema on 8/2 negative for any obstructing lesions - Patient tolerating solid diet - BM yesterday, continue current bowel regimen with MiraLAX TID, Senokot-S and Dulcolax.   Severe back pain Patient complaining of low back pain, stated that this is what led to her original complaint of severe constipation when patient was started on pain medications - placed on tramadol, -  Discontinue Toradol and naproxen. Creatinine 1.2 today -MRI L spine showed subacute compression fracture of L1, d/w patient's daughter, agreed for kyphoplasty. IR to eval.    probable UTI Urine cultures with multiple morphotypesCompleted IV Levaquin  leukocytosis Likely secondary to problem #2 and reactive leukocytosis. - Currently no leukocytosis, urine cultures with multiple morphotypes, Completed IV Levaquin   GERD Continue PPI   hypertension Continue Norvasc.  Hypokalemia Replaced   Code Status: Full  DVT Prophylaxis:  Lovenox  Family Communication:  Discussed in detail with the patient, all imaging results, lab results explained to the patient    Disposition Plan:   Time Spent in minutes 25 minutes  Procedures:   CXR 03/04/2016  KUB 03/04/2016  CT abdomen and pelvis 03/04/2016  NG tube placement under fluoroscopy 03/05/2016  Consultants:   Gastroenterology surgery  Antimicrobials:   IV levaquin   Medications  Scheduled Meds: . acetaminophen  1,000 mg Oral TID  . amLODipine  5 mg Oral Daily  . cyclobenzaprine  5 mg Oral TID  . heparin subcutaneous  5,000 Units Subcutaneous Q8H  . latanoprost  1 drop Left Eye QHS  . lidocaine  1 patch Transdermal Q24H  . pantoprazole  40 mg Oral Q0600  . polyethylene glycol  17 g Oral TID  . senna-docusate  1 tablet Oral BID   Continuous Infusions:   PRN Meds:.acetaminophen, HYDROmorphone (DILAUDID) injection, ondansetron (ZOFRAN) IV, phenol, prochlorperazine   Antibiotics   Anti-infectives    Start     Dose/Rate Route Frequency Ordered Stop   03/08/16 0800  levofloxacin (LEVAQUIN) IVPB 750 mg  Status:  Discontinued     750 mg 100 mL/hr over 90 Minutes Intravenous Every 48 hours 03/06/16 1029 03/11/16 1013   03/04/16 0800  levofloxacin (LEVAQUIN) IVPB 750 mg  Status:  Discontinued     750 mg 100 mL/hr over 90 Minutes Intravenous Every 24 hours 03/03/16 2221 03/06/16 1029        Subjective:   [name] was seen and examined today. Back pain only on movement otherwise stable, BM yesterday. No nausea or vomiting. No fevers or chills.  Patient denies dizziness, chest pain, shortness of breath, new weakness, numbess, tingling. No acute events overnight.    Objective:   Vitals:   03/11/16 0449 03/11/16 1427 03/11/16 2120 03/12/16 0522  BP: 130/68 (!) 110/43 (!) 115/54 (!) 114/55  Pulse: (!) 101 (!) 102 96 (!) 106  Resp: 20 18 18 18   Temp: 98.1 F (36.7 C) 98.2 F (36.8 C) 97.7 F (36.5 C) 99 F (37.2 C)  TempSrc:  Oral Oral Oral  SpO2: 93% 97% 95% 91%    Weight:      Height:        Intake/Output Summary (Last 24 hours) at 03/12/16 1310 Last data filed at 03/12/16 1214  Gross per 24 hour  Intake              300 ml  Output              350 ml  Net              -50 ml     Wt Readings from Last 3 Encounters:  03/03/16 58.9 kg (129 lb 13.6 oz)     Exam  General: Alert and oriented x 3, NAD  HEENT:    Neck:   Cardiovascular: S1 S2clear, RRR  Respiratory: CTAB  Gastrointestinal: Soft, nontender, nondistended, hypoactive bowel sounds  Ext: no cyanosis clubbing or edema  Neuro:   Skin: No rashes  Psych: Normal affect and demeanor, alert and oriented x3    Data Reviewed:  I have personally reviewed following labs and imaging studies  Micro Results Recent Results (from the past 240 hour(s))  Urine culture     Status: Abnormal   Collection Time: 03/04/16  6:07 PM  Result Value Ref Range Status   Specimen Description URINE, RANDOM  Final   Special Requests NONE  Final   Culture MULTIPLE SPECIES PRESENT, SUGGEST RECOLLECTION (A)  Final   Report Status 03/05/2016 FINAL  Final    Radiology Reports Dg Chest 2 View  Result Date: 03/04/2016 CLINICAL DATA:  Leukocytosis. EXAM: CHEST  2 VIEW COMPARISON:  None. FINDINGS: Small bilateral effusions. The heart, hila, an mediastinum are unremarkable. No pulmonary nodules or masses. No focal infiltrates. Mild pleural thickening laterally in the right chest, likely chronic. No other acute abnormalities. IMPRESSION: Small effusions and underlying atelectasis. Electronically Signed   By: Gerome Sam III M.D   On: 03/04/2016 11:29  Mr Lumbar Spine Wo Contrast  Result Date: 03/10/2016 CLINICAL DATA:  Low back and bilateral hip pain. Status post blow to the back on a commode yesterday. Initial encounter. EXAM: MRI LUMBAR SPINE WITHOUT CONTRAST TECHNIQUE: Multiplanar, multisequence MR imaging of the lumbar spine was performed. No intravenous contrast was administered. COMPARISON:  CT  abdomen and pelvis 03/04/2016. FINDINGS: Segmentation:  Unremarkable. Alignment: Trace anterolisthesis L3 on L4 is identified. Otherwise unremarkable. Vertebrae: The patient has a subacute fracture of L1 with vertebral body height loss of up to 70% centrally. Marrow edema is seen within the vertebral body. The fracture is identified on the prior CT. No new fracture. Scattered degenerative endplate signal change is seen. Conus medullaris: Extends to the L2 level and appears normal. Paraspinal and other soft tissues: Unremarkable. Disc levels: T11-12 is imaged in the sagittal plane only and negative. T12-L1: There is some bony retropulsion off the superior endplate of L1 but the central canal and foramina are open. L1-2:  Mild ligamentum flavum thickening.  Otherwise negative. L2-3: Ligamentum flavum thickening and a minimal disc bulge are seen. There is facet degenerative disease. The central canal and foramina are open. L3-4: Shallow disc bulge with a superimposed small central protrusion. There is some ligamentum flavum thickening. The central canal and foramina are open. L4-5: Left laminotomy defect is identified. There is a shallow disc bulge and superimposed left lateral recess protrusion. Disc contacts the left L5 root in lateral recess without compression or displacement. The thecal sac is widely patent. Mild right foraminal narrowing is noted. The left foramen is open. L5-S1: Small right lateral recess and foraminal protrusion without stenosis. IMPRESSION: Superior endplate compression fracture of L1 with up to 70% vertebral body height loss is subacute. The fracture is present on the prior CT scan from 03/04/2016. There is no acute fracture. Status post left laminotomy at L4-5. Disc contacts the descending left L5 root in the lateral recess without compression or displacement. Mild right foraminal narrowing is seen at this level. Please see above for description of other levels. Electronically Signed   By:  Drusilla Kanner M.D.   On: 03/10/2016 15:50   Ct Abdomen Pelvis W Contrast  Result Date: 03/04/2016 CLINICAL DATA:  82 year old female with abdominal pain and distention and severe constipation. EXAM: CT ABDOMEN AND PELVIS WITH CONTRAST TECHNIQUE: Multidetector CT imaging of the abdomen and pelvis was performed using the standard protocol following bolus administration of intravenous contrast. CONTRAST:  80mL ISOVUE-300  IOPAMIDOL (ISOVUE-300) INJECTION 61% COMPARISON:  03/04/2016 FINDINGS: Lower chest: Mild cardiomegaly noted. A moderate hiatal hernia is noted. There is filling defect within this hernia which could represent food but difficult to exclude a mass. Very small bilateral pleural effusions and mild bibasilar atelectasis noted. Hepatobiliary: The liver and gallbladder are unremarkable. There is no evidence of biliary dilatation. Pancreas: Atrophic without other significant abnormality. Spleen: Unremarkable Adrenals/Urinary Tract: Bilateral renal atrophy identified. There is no evidence of hydronephrosis. The adrenal glands and bladder are unremarkable. Stomach/Bowel: A very large amount of stool is noted within the cecum, ascending colon, transverse colon and proximal descending colon. Gas and fluid within the cecum is also identified which measures up to 9.5 cm in greatest diameter. There is a relative transition point in the mid descending colon without definite colonic abnormality. No wall thickening is identified. There is no evidence of small bowel dilatation. Vascular/Lymphatic: Aortic atherosclerotic calcifications noted without aneurysm. No enlarged lymph nodes are identified. Reproductive: The patient is status post hysterectomy. No adnexal masses identified. Other: No ascites, pneumoperitoneum or abscess. Musculoskeletal: Degenerative changes within the lumbar spine identified. 25% superior endplate compression fracture of L1 is age indeterminate. Mild bony retropulsion noted. IMPRESSION:  Distended cecum, ascending colon, transverse colon and proximal descending colon filled with large amount of stool. Relative transition point in the mid descending colon without definite colonic abnormality. Consider direct inspection. No evidence of bowel wall thickening, small bowel obstruction or pneumoperitoneum. Moderate hiatal hernia with filling defect, which could represent feet but a mass is not entirely excluded. Consider direct inspection as indicated. 25% superior endplate compression fracture of L1- indeterminate. Very small bilateral pleural effusions with bibasilar atelectasis. Cardiomegaly. Electronically Signed   By: Harmon Pier M.D.   On: 03/04/2016 16:36  Dg Abd 2 Views  Result Date: 03/07/2016 CLINICAL DATA:  Abdominal discomfort EXAM: ABDOMEN - 2 VIEW COMPARISON:  03/06/2016 FINDINGS: Nasogastric catheter is again seen and stable. Scattered large and small bowel gas is noted. Persistent dilatation is noted in the colon on the right although the degree of dilatation has improved significantly now 10.7 cm in the right mid abdomen. No free air is seen. No bony abnormality is noted. IMPRESSION: Mild persistent right colonic dilatation although significantly improved from the prior exam. Electronically Signed   By: Alcide Clever M.D.   On: 03/07/2016 08:02   Dg Abd 2 Views  Result Date: 03/06/2016 CLINICAL DATA:  82 year old female with obstipation. Subsequent encounter. EXAM: ABDOMEN - 2 VIEW COMPARISON:  03/05/2016 plain film exam.  03/04/2016 FINDINGS: Nasogastric tube is in place. Tip projects over the left upper quadrant. Side hole just below the diaphragm level. Patient has a hiatal hernia as noted on recent CT. Gas distended colon. Dilated cecum difficult adequately measure as there may be a superior medial displacement of the cecum. Using similar landmarks to recent plain film examination. Dilated proximal right colon/cecum now spanning over 19.2 cm versus prior 14.3 cm. Progressive  distention of gas distended transverse colon measuring up to 8.2 cm versus prior 6.7 cm. Cannot exclude obstructing lesion.  Please see recent CT report. No free intraperitoneal air noted on decubitus view. IMPRESSION: Progressive distension of the right colon/cecum and transverse colon as noted above. Electronically Signed   By: Lacy Duverney M.D.   On: 03/06/2016 08:00  Dg Abd 2 Views  Result Date: 03/05/2016 CLINICAL DATA:  Obstipation EXAM: ABDOMEN - 2 VIEW COMPARISON:  CT 03/04/2016 FINDINGS: Diffuse gaseous distention of the colon, most pronounced involving the  cecum. Cecum measures approximately 14 cm in diameter. When the cecal diameter was measured on the scout image on prior study, this measured 13 cm. No free air organomegaly. IMPRESSION: Marked gaseous distention of the colon, particularly cecum. This is similar to prior CT. Electronically Signed   By: Charlett NoseKevin  Dover M.D.   On: 03/05/2016 09:05  Dg Abd 2 Views  Result Date: 03/04/2016 CLINICAL DATA:  Leukocytosis and constipation EXAM: ABDOMEN - 2 VIEW COMPARISON:  None. FINDINGS: No free air, portal venous gas, or pneumatosis. Small effusions in the lung bases. Markedly dilated colon primarily in the right lower abdomen with air-fluid levels on the upright view. The most dilated loop measures up to 11.7 cm increasing the risk of perforation. The more distal loops of colon are relatively decompressed but air does extend into the rectum. No pneumatosis. No other acute abnormalities. IMPRESSION: There is a markedly dilated loop of colon in the right side of the abdomen. A sigmoid volvulus is not excluded. Recommend a CT scan for further evaluation. Findings being called to Dr. Maisie Fushomas. Electronically Signed   By: Gerome Samavid  Williams III M.D   On: 03/04/2016 11:39  Dg Basil DessNaso G Tube Plc W/fl W/rad  Result Date: 03/05/2016 CLINICAL DATA:  Colonic distention. Request for nasogastric tube placement. Difficult to place a nasogastric tube on the floor. EXAM:  NASO G TUBE PLACEMENT WITH FL AND WITH RAD CONTRAST:  28 mL of Gastrografin injected through nasogastric tube FLUOROSCOPY TIME:  If the device does not provide the exposure index: Fluoroscopy Time (in minutes and seconds):  1 minutes and 6 seconds Number of Acquired Images:  2 COMPARISON:  Abdominal CT 03/04/2016 FINDINGS: Both nostrils anesthetized with viscous lidocaine. 14 French nasogastric tube would not easily advance through the right nostril. The tube easily advanced through the left nostril. Initially, the tube appeared to advance into the airway. The tube was pulled back and directed into the esophagus. Tube was advanced into the upper abdomen. Contrast injection confirmed placement in the stomach. IMPRESSION: Successful placement of a nasogastric tube with fluoroscopy. Catheter tip in the stomach. Electronically Signed   By: Richarda OverlieAdam  Henn M.D.   On: 03/05/2016 14:36  Dg Colon W/water Sol Cm  Result Date: 03/08/2016 CLINICAL DATA:  Colonic obstruction. EXAM: WATER SOLUBLE CONTRAST ENEMA TECHNIQUE: Enema was performed using water-soluble contrast. FLUOROSCOPY TIME:  Radiation Exposure Index (as provided by the fluoroscopic device): 2 minutes If the device does not provide the exposure index: Fluoroscopy Time:  dictate in minutes and seconds Number of Acquired Images:  6 COMPARISON:  CT 03/04/2016.  Plain films 03/07/2016. FINDINGS: There is a slow gradual caliber change of the colon from the sigmoid colon into the descending colon. Mild distention of the transverse colon PICC. Cecum is moderately distended with gas. There is no colonic obstruction noted. Scattered sigmoid diverticula. IMPRESSION: No evidence of colonic obstruction. Scattered sigmoid diverticulosis. Electronically Signed   By: Charlett NoseKevin  Dover M.D.   On: 03/08/2016 15:33    Lab Data:  CBC:  Recent Labs Lab 03/06/16 0621 03/07/16 0602 03/08/16 1013 03/11/16 0521 03/12/16 0627  WBC 16.4* 9.9 9.9 8.6 10.1  HGB 11.4* 9.8* 11.0* 11.7*  11.0*  HCT 35.4* 29.6* 32.7* 34.3* 32.3*  MCV 89.2 88.6 88.1 86.0 84.8  PLT 417* 373 338 407* 314   Basic Metabolic Panel:  Recent Labs Lab 03/06/16 0621 03/07/16 0602 03/08/16 0909 03/11/16 0521 03/12/16 0627  NA 135 133* 131* 132* 130*  K 4.3 3.8 3.5 2.8* 2.8*  CL 103 100* 96* 89* 88*  CO2 21* 24 23 30 30   GLUCOSE 89 66 98 91 101*  BUN 26* 15 8 8  21*  CREATININE 0.87 0.72 0.74 0.72 1.22*  CALCIUM 10.6* 9.8 9.8 10.5* 10.2  MG 1.7 1.6* 1.5*  --   --    GFR: Estimated Creatinine Clearance: 24.7 mL/min (by C-G formula based on SCr of 1.22 mg/dL). Liver Function Tests: No results for input(s): AST, ALT, ALKPHOS, BILITOT, PROT, ALBUMIN in the last 168 hours. No results for input(s): LIPASE, AMYLASE in the last 168 hours. No results for input(s): AMMONIA in the last 168 hours. Coagulation Profile:  Recent Labs Lab 03/12/16 0627  INR 1.16   Cardiac Enzymes: No results for input(s): CKTOTAL, CKMB, CKMBINDEX, TROPONINI in the last 168 hours. BNP (last 3 results) No results for input(s): PROBNP in the last 8760 hours. HbA1C: No results for input(s): HGBA1C in the last 72 hours. CBG: No results for input(s): GLUCAP in the last 168 hours. Lipid Profile: No results for input(s): CHOL, HDL, LDLCALC, TRIG, CHOLHDL, LDLDIRECT in the last 72 hours. Thyroid Function Tests: No results for input(s): TSH, T4TOTAL, FREET4, T3FREE, THYROIDAB in the last 72 hours. Anemia Panel: No results for input(s): VITAMINB12, FOLATE, FERRITIN, TIBC, IRON, RETICCTPCT in the last 72 hours. Urine analysis:    Component Value Date/Time   COLORURINE AMBER (A) 03/04/2016 0050   APPEARANCEUR CLOUDY (A) 03/04/2016 0050   LABSPEC 1.022 03/04/2016 0050   PHURINE 6.0 03/04/2016 0050   GLUCOSEU NEGATIVE 03/04/2016 0050   HGBUR SMALL (A) 03/04/2016 0050   BILIRUBINUR SMALL (A) 03/04/2016 0050   KETONESUR 15 (A) 03/04/2016 0050   PROTEINUR 30 (A) 03/04/2016 0050   NITRITE NEGATIVE 03/04/2016 0050    LEUKOCYTESUR MODERATE (A) 03/04/2016 0050     Trase Bunda M.D. [facility] 03/12/2016, 1:10 PM  Pager: [phone] Between 7am to 7pm - call Pager - [phone]  After 7pm go to [email or web address] - password [email or web address]  Call night coverage person covering after 7pm

## 2016-03-12 NOTE — Progress Notes (Signed)
PT Cancellation Note  Patient Details Name: Tara Frederick MRN: [id number] DOB: July 18, 1924   Cancelled Treatment:    Reason Eval/Treat Not Completed: Pain limiting ability to participate. Pt states her pain is tolerable at present and did not want to get up as this increased her pain yesterday. Pt and daughter questioning if she will have kyphoplasty tomorrow.  PT will continue to follow and attempt to mobilize as pain allows.     Donnella ShamSawulski, Aalayah Riles J 03/12/2016, 2:00 PM  Lavona MoundMark Lachlyn Vanderstelt, [place]  [phone] 03/12/2016

## 2016-03-13 LAB — BASIC METABOLIC PANEL
Anion gap: 8 (ref 5–15)
BUN: 19 mg/dL (ref 6–20)
CHLORIDE: 97 mmol/L — AB (ref 101–111)
CO2: 28 mmol/L (ref 22–32)
CREATININE: 0.98 mg/dL (ref 0.44–1.00)
Calcium: 10.4 mg/dL — ABNORMAL HIGH (ref 8.9–10.3)
GFR calc Af Amer: 58 mL/min — ABNORMAL LOW (ref >60)
GFR calc non Af Amer: 50 mL/min — ABNORMAL LOW (ref >60)
GLUCOSE: 95 mg/dL (ref 65–99)
POTASSIUM: 4.8 mmol/L (ref 3.5–5.1)
SODIUM: 133 mmol/L — AB (ref 135–145)

## 2016-03-13 LAB — CBC
HCT: 32.5 % — ABNORMAL LOW (ref 36.0–46.0)
Hemoglobin: 10.9 g/dL — ABNORMAL LOW (ref 12.0–15.0)
MCH: 29.2 pg (ref 26.0–34.0)
MCHC: 33.5 g/dL (ref 30.0–36.0)
MCV: 87.1 fL (ref 78.0–100.0)
Platelets: 400 10*3/uL (ref 150–400)
RBC: 3.73 MIL/uL — AB (ref 3.87–5.11)
RDW: 13.3 % (ref 11.5–15.5)
WBC: 11.1 10*3/uL — ABNORMAL HIGH (ref 4.0–10.5)

## 2016-03-13 MED ORDER — HEPARIN SODIUM (PORCINE) 5000 UNIT/ML IJ SOLN
5000.0000 [IU] | Freq: Three times a day (TID) | INTRAMUSCULAR | Status: DC
Start: 2016-03-15 — End: 2016-03-16
  Administered 2016-03-15 – 2016-03-16 (×5): 5000 [IU] via SUBCUTANEOUS
  Filled 2016-03-13 (×5): qty 1

## 2016-03-13 MED ORDER — CLINDAMYCIN PHOSPHATE 600 MG/50ML IV SOLN
600.0000 mg | INTRAVENOUS | Status: AC
  Administered 2016-03-14: 600 mg via INTRAVENOUS
  Filled 2016-03-13: qty 50

## 2016-03-13 NOTE — Care Management Important Message (Signed)
Important Message  Patient Details  Name: Tara GermanFrances Shostak MRN: [id number] Date of Birth: 07-27-1924   Medicare Important Message Given:  Yes    Ormond Lazo Abena 03/13/2016, 11:29 AM

## 2016-03-13 NOTE — Progress Notes (Signed)
[facility]                                                                              Patient Demographics  Tara Frederick, is a 82 y.o. female, DOB - 03/30/1924, ZOX:[id number]  Admit date - 03/03/2016   Admitting Physician Tara Arms, MD  Outpatient Primary MD for the patient is No primary care provider on file.  Outpatient specialists:   LOS - 10  days    No chief complaint on file.      Brief summary   Tara Frederick a 82 y.o.femalewith medical history significant of HTN, GERD, degenerative disk disease. Patient given narcotics in ED last week for back pain. Since then she has had severe constipation. Took narcotic last on Sunday. No BM since then. Presented to [facility] 3 days ago. Was admitted. CT scan at that time showed severe constipation. Attempts made to treat medically include: 2L golytely, miralax BID, dulcolax  PO daily, senna, multiple fleet enemas, and 2 attempts at manual disimpaction (no stool in rectum). All of the above attempts to treat medically have failed, and today patient finally developed nausea and ultimately numerous episodes of vomiting on ambulance ride down here. Patient was admitted given oral MiraLAX and some enemas with no significant improvement. Abdominal films showed worsening cecal dilatation. GI was consulted. A G-tube placed on the fluoroscopy. Patient given every 2 hour MiraLAX and tap water enemas. Patient finally with multiple bowel movements. Patient for Gastrografin enema tomorrow 03/08/2016.    Assessment & Plan    Severe obstipation - Patient's last bowel movement was July 12 after receiving narcotic pain medications for back injury, Presented with worsening abdominal distention and inability to have bowel movements despite multiple laxatives and multiple Fleet enemas as well as an attempt at manual disimpaction with no stool noted in the rectal vault the outside hospital.  -  Plain films of the  abdomen with a markedly dilated loop of colon in the right side of the abdomen measuring approximately 11.7 cm. Increasing risk for perforation. Concern for possible sigmoid volvulus. -  CT abdomen and pelvis done 03/04/2016 with a distended cecum measuring 9.5 cm, ascending colon transverse colon and proximal descending colon filled with large amount of stool. 25% superior endplate compression fracture of L1 indeterminate.  - multiple attempts at NG tube placement the night of 03/04/2016 was unsuccessful. Patient also had multiple tap water enemas were no results. NG tube placed under fluoroscopy per radiology 03/05/2016. NG tube placed to suction.  - Subsequently, patient had multiple BMs on 7/30-8/1 while on every 2 hours MiraLAX with improvement with abdominal distention and abdominal pain. Abdominal x-rays with improvement. Significant clinical improvement. NG tube was clamped - Patient was started on clears, GI has been following closely.  - Gastrografin enema on 8/2 negative for any obstructing lesions - Patient tolerating solid diet - BM yesterday, continue current bowel regimen with MiraLAX TID, Senokot-S and Dulcolax.   Severe back pain Patient complaining of low back pain, stated that this is what led to her original complaint of severe constipation when patient was started on pain medications - placed on tramadol, -  Discontinue Toradol and naproxen. Creatinine 1.2 -> 0.98 today -MRI L spine showed subacute compression fracture of L1, d/w patient's daughter, agreed for kyphoplasty. IR planned kyphoplasty on 8/8   probable UTI Urine cultures with multiple morphotypesCompleted IV Levaquin  leukocytosis Likely secondary to problem #2 and reactive leukocytosis. - Currently no leukocytosis, urine cultures with multiple morphotypes, Completed IV Levaquin   GERD Continue PPI   hypertension Continue Norvasc.    Code Status: Full  DVT Prophylaxis:  Lovenox  Family  Communication: Discussed in detail with the patient, all imaging results, lab results explained to the patient    Disposition Plan:   Time Spent in minutes15 minutes  Procedures:   CXR 03/04/2016  KUB 03/04/2016  CT abdomen and pelvis 03/04/2016  NG tube placement under fluoroscopy 03/05/2016  Consultants:   Gastroenterology surgery  Antimicrobials:   IV levaquin   Medications  Scheduled Meds: . acetaminophen  1,000 mg Oral TID  . amLODipine  5 mg Oral Daily  . cyclobenzaprine  5 mg Oral TID  . heparin subcutaneous  5,000 Units Subcutaneous Q8H  . latanoprost  1 drop Left Eye QHS  . lidocaine  1 patch Transdermal Q24H  . pantoprazole  40 mg Oral Q0600  . polyethylene glycol  17 g Oral TID  . senna-docusate  1 tablet Oral BID   Continuous Infusions: . sodium chloride 75 mL/hr at 03/13/16 0510   PRN Meds:.acetaminophen, HYDROmorphone (DILAUDID) injection, ondansetron (ZOFRAN) IV, phenol, prochlorperazine   Antibiotics   Anti-infectives    Start     Dose/Rate Route Frequency Ordered Stop   03/08/16 0800  levofloxacin (LEVAQUIN) IVPB 750 mg  Status:  Discontinued     750 mg 100 mL/hr over 90 Minutes Intravenous Every 48 hours 03/06/16 1029 03/11/16 1013   03/04/16 0800  levofloxacin (LEVAQUIN) IVPB 750 mg  Status:  Discontinued     750 mg 100 mL/hr over 90 Minutes Intravenous Every 24 hours 03/03/16 2221 03/06/16 1029        Subjective:   Tara Frederick was seen and examined today. Back pain only on movement. BM yesterday. no nausea or vomiting. No fevers or chills.  Patient denies dizziness, chest pain, shortness of breath, new weakness, numbess, tingling. No acute events overnight.    Objective:   Vitals:   03/12/16 0522 03/12/16 1403 03/12/16 2213 03/13/16 0518  BP: (!) 114/55 (!) 103/47 (!) 108/51 (!) 118/53  Pulse: (!) 106 99 86 88  Resp: 18 20 18 18   Temp: 99 F (37.2 C) 97.7 F (36.5 C) 97.9 F (36.6 C) 98.9 F (37.2 C)  TempSrc: Oral Oral  Oral Oral  SpO2: 91% 94% 92% 95%  Weight:      Height:        Intake/Output Summary (Last 24 hours) at 03/13/16 1127 Last data filed at 03/13/16 4098  Gross per 24 hour  Intake              420 ml  Output              800 ml  Net             -380 ml     Wt Readings from Last 3 Encounters:  03/03/16 58.9 kg (129 lb 13.6 oz)     Exam  General: Alert and oriented x 3, NAD  HEENT:    Neck:   Cardiovascular: S1 S2clear, RRR  Respiratory: CTAB  Gastrointestinal: Soft, nontender, nondistended, +BS  Ext: no cyanosis clubbing or edema  Neuro:   Skin: No rashes  Psych: Normal affect and demeanor, alert and oriented x3    Data Reviewed:  I have personally reviewed following labs and imaging studies  Micro Results Recent Results (from the past 240 hour(s))  Urine culture     Status: Abnormal   Collection Time: 03/04/16  6:07 PM  Result Value Ref Range Status   Specimen Description URINE, RANDOM  Final   Special Requests NONE  Final   Culture MULTIPLE SPECIES PRESENT, SUGGEST RECOLLECTION (A)  Final   Report Status 03/05/2016 FINAL  Final    Radiology Reports Dg Chest 2 View  Result Date: 03/04/2016 CLINICAL DATA:  Leukocytosis. EXAM: CHEST  2 VIEW COMPARISON:  None. FINDINGS: Small bilateral effusions. The heart, hila, an mediastinum are unremarkable. No pulmonary nodules or masses. No focal infiltrates. Mild pleural thickening laterally in the right chest, likely chronic. No other acute abnormalities. IMPRESSION: Small effusions and underlying atelectasis. Electronically Signed   By: Gerome Sam III M.D   On: 03/04/2016 11:29  Mr Lumbar Spine Wo Contrast  Result Date: 03/10/2016 CLINICAL DATA:  Low back and bilateral hip pain. Status post blow to the back on a commode yesterday. Initial encounter. EXAM: MRI LUMBAR SPINE WITHOUT CONTRAST TECHNIQUE: Multiplanar, multisequence MR imaging of the lumbar spine was performed. No intravenous contrast was administered.  COMPARISON:  CT abdomen and pelvis 03/04/2016. FINDINGS: Segmentation:  Unremarkable. Alignment: Trace anterolisthesis L3 on L4 is identified. Otherwise unremarkable. Vertebrae: The patient has a subacute fracture of L1 with vertebral body height loss of up to 70% centrally. Marrow edema is seen within the vertebral body. The fracture is identified on the prior CT. No new fracture. Scattered degenerative endplate signal change is seen. Conus medullaris: Extends to the L2 level and appears normal. Paraspinal and other soft tissues: Unremarkable. Disc levels: T11-12 is imaged in the sagittal plane only and negative. T12-L1: There is some bony retropulsion off the superior endplate of L1 but the central canal and foramina are open. L1-2:  Mild ligamentum flavum thickening.  Otherwise negative. L2-3: Ligamentum flavum thickening and a minimal disc bulge are seen. There is facet degenerative disease. The central canal and foramina are open. L3-4: Shallow disc bulge with a superimposed small central protrusion. There is some ligamentum flavum thickening. The central canal and foramina are open. L4-5: Left laminotomy defect is identified. There is a shallow disc bulge and superimposed left lateral recess protrusion. Disc contacts the left L5 root in lateral recess without compression or displacement. The thecal sac is widely patent. Mild right foraminal narrowing is noted. The left foramen is open. L5-S1: Small right lateral recess and foraminal protrusion without stenosis. IMPRESSION: Superior endplate compression fracture of L1 with up to 70% vertebral body height loss is subacute. The fracture is present on the prior CT scan from 03/04/2016. There is no acute fracture. Status post left laminotomy at L4-5. Disc contacts the descending left L5 root in the lateral recess without compression or displacement. Mild right foraminal narrowing is seen at this level. Please see above for description of other levels.  Electronically Signed   By: Drusilla Kanner M.D.   On: 03/10/2016 15:50   Ct Abdomen Pelvis W Contrast  Result Date: 03/04/2016 CLINICAL DATA:  82 year old female with abdominal pain and distention and severe constipation. EXAM: CT ABDOMEN AND PELVIS WITH CONTRAST TECHNIQUE: Multidetector CT imaging of the abdomen and pelvis was performed using the standard protocol following bolus administration of intravenous contrast. CONTRAST:  80mL ISOVUE-300  IOPAMIDOL (ISOVUE-300) INJECTION 61% COMPARISON:  03/04/2016 FINDINGS: Lower chest: Mild cardiomegaly noted. A moderate hiatal hernia is noted. There is filling defect within this hernia which could represent food but difficult to exclude a mass. Very small bilateral pleural effusions and mild bibasilar atelectasis noted. Hepatobiliary: The liver and gallbladder are unremarkable. There is no evidence of biliary dilatation. Pancreas: Atrophic without other significant abnormality. Spleen: Unremarkable Adrenals/Urinary Tract: Bilateral renal atrophy identified. There is no evidence of hydronephrosis. The adrenal glands and bladder are unremarkable. Stomach/Bowel: A very large amount of stool is noted within the cecum, ascending colon, transverse colon and proximal descending colon. Gas and fluid within the cecum is also identified which measures up to 9.5 cm in greatest diameter. There is a relative transition point in the mid descending colon without definite colonic abnormality. No wall thickening is identified. There is no evidence of small bowel dilatation. Vascular/Lymphatic: Aortic atherosclerotic calcifications noted without aneurysm. No enlarged lymph nodes are identified. Reproductive: The patient is status post hysterectomy. No adnexal masses identified. Other: No ascites, pneumoperitoneum or abscess. Musculoskeletal: Degenerative changes within the lumbar spine identified. 25% superior endplate compression fracture of L1 is age indeterminate. Mild bony  retropulsion noted. IMPRESSION: Distended cecum, ascending colon, transverse colon and proximal descending colon filled with large amount of stool. Relative transition point in the mid descending colon without definite colonic abnormality. Consider direct inspection. No evidence of bowel wall thickening, small bowel obstruction or pneumoperitoneum. Moderate hiatal hernia with filling defect, which could represent feet but a mass is not entirely excluded. Consider direct inspection as indicated. 25% superior endplate compression fracture of L1- indeterminate. Very small bilateral pleural effusions with bibasilar atelectasis. Cardiomegaly. Electronically Signed   By: Harmon PierJeffrey  Hu M.D.   On: 03/04/2016 16:36  Dg Abd 2 Views  Result Date: 03/07/2016 CLINICAL DATA:  Abdominal discomfort EXAM: ABDOMEN - 2 VIEW COMPARISON:  03/06/2016 FINDINGS: Nasogastric catheter is again seen and stable. Scattered large and small bowel gas is noted. Persistent dilatation is noted in the colon on the right although the degree of dilatation has improved significantly now 10.7 cm in the right mid abdomen. No free air is seen. No bony abnormality is noted. IMPRESSION: Mild persistent right colonic dilatation although significantly improved from the prior exam. Electronically Signed   By: Alcide CleverMark  Lukens M.D.   On: 03/07/2016 08:02   Dg Abd 2 Views  Result Date: 03/06/2016 CLINICAL DATA:  82 year old female with obstipation. Subsequent encounter. EXAM: ABDOMEN - 2 VIEW COMPARISON:  03/05/2016 plain film exam.  03/04/2016 FINDINGS: Nasogastric tube is in place. Tip projects over the left upper quadrant. Side hole just below the diaphragm level. Patient has a hiatal hernia as noted on recent CT. Gas distended colon. Dilated cecum difficult adequately measure as there may be a superior medial displacement of the cecum. Using similar landmarks to recent plain film examination. Dilated proximal right colon/cecum now spanning over 19.2 cm  versus prior 14.3 cm. Progressive distention of gas distended transverse colon measuring up to 8.2 cm versus prior 6.7 cm. Cannot exclude obstructing lesion.  Please see recent CT report. No free intraperitoneal air noted on decubitus view. IMPRESSION: Progressive distension of the right colon/cecum and transverse colon as noted above. Electronically Signed   By: Lacy DuverneySteven  Olson M.D.   On: 03/06/2016 08:00  Dg Abd 2 Views  Result Date: 03/05/2016 CLINICAL DATA:  Obstipation EXAM: ABDOMEN - 2 VIEW COMPARISON:  CT 03/04/2016 FINDINGS: Diffuse gaseous distention of the colon, most pronounced involving the  cecum. Cecum measures approximately 14 cm in diameter. When the cecal diameter was measured on the scout image on prior study, this measured 13 cm. No free air organomegaly. IMPRESSION: Marked gaseous distention of the colon, particularly cecum. This is similar to prior CT. Electronically Signed   By: Charlett Nose M.D.   On: 03/05/2016 09:05  Dg Abd 2 Views  Result Date: 03/04/2016 CLINICAL DATA:  Leukocytosis and constipation EXAM: ABDOMEN - 2 VIEW COMPARISON:  None. FINDINGS: No free air, portal venous gas, or pneumatosis. Small effusions in the lung bases. Markedly dilated colon primarily in the right lower abdomen with air-fluid levels on the upright view. The most dilated loop measures up to 11.7 cm increasing the risk of perforation. The more distal loops of colon are relatively decompressed but air does extend into the rectum. No pneumatosis. No other acute abnormalities. IMPRESSION: There is a markedly dilated loop of colon in the right side of the abdomen. A sigmoid volvulus is not excluded. Recommend a CT scan for further evaluation. Findings being called to Dr. Maisie Fus. Electronically Signed   By: Gerome Sam III M.D   On: 03/04/2016 11:39  Dg Basil Dess Tube Plc W/fl W/rad  Result Date: 03/05/2016 CLINICAL DATA:  Colonic distention. Request for nasogastric tube placement. Difficult to place a  nasogastric tube on the floor. EXAM: NASO G TUBE PLACEMENT WITH FL AND WITH RAD CONTRAST:  28 mL of Gastrografin injected through nasogastric tube FLUOROSCOPY TIME:  If the device does not provide the exposure index: Fluoroscopy Time (in minutes and seconds):  1 minutes and 6 seconds Number of Acquired Images:  2 COMPARISON:  Abdominal CT 03/04/2016 FINDINGS: Both nostrils anesthetized with viscous lidocaine. 14 French nasogastric tube would not easily advance through the right nostril. The tube easily advanced through the left nostril. Initially, the tube appeared to advance into the airway. The tube was pulled back and directed into the esophagus. Tube was advanced into the upper abdomen. Contrast injection confirmed placement in the stomach. IMPRESSION: Successful placement of a nasogastric tube with fluoroscopy. Catheter tip in the stomach. Electronically Signed   By: Richarda Overlie M.D.   On: 03/05/2016 14:36  Dg Colon W/water Sol Cm  Result Date: 03/08/2016 CLINICAL DATA:  Colonic obstruction. EXAM: WATER SOLUBLE CONTRAST ENEMA TECHNIQUE: Enema was performed using water-soluble contrast. FLUOROSCOPY TIME:  Radiation Exposure Index (as provided by the fluoroscopic device): 2 minutes If the device does not provide the exposure index: Fluoroscopy Time:  dictate in minutes and seconds Number of Acquired Images:  6 COMPARISON:  CT 03/04/2016.  Plain films 03/07/2016. FINDINGS: There is a slow gradual caliber change of the colon from the sigmoid colon into the descending colon. Mild distention of the transverse colon PICC. Cecum is moderately distended with gas. There is no colonic obstruction noted. Scattered sigmoid diverticula. IMPRESSION: No evidence of colonic obstruction. Scattered sigmoid diverticulosis. Electronically Signed   By: Charlett Nose M.D.   On: 03/08/2016 15:33    Lab Data:  CBC:  Recent Labs Lab 03/07/16 0602 03/08/16 1013 03/11/16 0521 03/12/16 0627 03/13/16 0534  WBC 9.9 9.9 8.6  10.1 11.1*  HGB 9.8* 11.0* 11.7* 11.0* 10.9*  HCT 29.6* 32.7* 34.3* 32.3* 32.5*  MCV 88.6 88.1 86.0 84.8 87.1  PLT 373 338 407* 314 400   Basic Metabolic Panel:  Recent Labs Lab 03/07/16 0602 03/08/16 0909 03/11/16 0521 03/12/16 0627 03/13/16 0534  NA 133* 131* 132* 130* 133*  K 3.8 3.5 2.8* 2.8* 4.8  CL 100* 96* 89* 88* 97*  CO2 24 23 30 30 28   GLUCOSE 66 98 91 101* 95  BUN 15 8 8  21* 19  CREATININE 0.72 0.74 0.72 1.22* 0.98  CALCIUM 9.8 9.8 10.5* 10.2 10.4*  MG 1.6* 1.5*  --   --   --    GFR: Estimated Creatinine Clearance: 30.8 mL/min (by C-G formula based on SCr of 0.98 mg/dL). Liver Function Tests: No results for input(s): AST, ALT, ALKPHOS, BILITOT, PROT, ALBUMIN in the last 168 hours. No results for input(s): LIPASE, AMYLASE in the last 168 hours. No results for input(s): AMMONIA in the last 168 hours. Coagulation Profile:  Recent Labs Lab 03/12/16 0627  INR 1.16   Cardiac Enzymes: No results for input(s): CKTOTAL, CKMB, CKMBINDEX, TROPONINI in the last 168 hours. BNP (last 3 results) No results for input(s): PROBNP in the last 8760 hours. HbA1C: No results for input(s): HGBA1C in the last 72 hours. CBG: No results for input(s): GLUCAP in the last 168 hours. Lipid Profile: No results for input(s): CHOL, HDL, LDLCALC, TRIG, CHOLHDL, LDLDIRECT in the last 72 hours. Thyroid Function Tests: No results for input(s): TSH, T4TOTAL, FREET4, T3FREE, THYROIDAB in the last 72 hours. Anemia Panel: No results for input(s): VITAMINB12, FOLATE, FERRITIN, TIBC, IRON, RETICCTPCT in the last 72 hours. Urine analysis:    Component Value Date/Time   COLORURINE AMBER (A) 03/04/2016 0050   APPEARANCEUR CLOUDY (A) 03/04/2016 0050   LABSPEC 1.022 03/04/2016 0050   PHURINE 6.0 03/04/2016 0050   GLUCOSEU NEGATIVE 03/04/2016 0050   HGBUR SMALL (A) 03/04/2016 0050   BILIRUBINUR SMALL (A) 03/04/2016 0050   KETONESUR 15 (A) 03/04/2016 0050   PROTEINUR 30 (A) 03/04/2016 0050    NITRITE NEGATIVE 03/04/2016 0050   LEUKOCYTESUR MODERATE (A) 03/04/2016 0050     RAI,RIPUDEEP M.D. [facility] 03/13/2016, 11:27 AM  Pager: [phone] Between 7am to 7pm - call Pager - [phone]  After 7pm go to [email or web address] - password [email or web address]  Call night coverage person covering after 7pm

## 2016-03-13 NOTE — Progress Notes (Signed)
PT Cancellation Note  Patient Details Name: Tara Frederick MRN: [id number] DOB: 06-18-1924   Cancelled Treatment:    Reason Eval/Treat Not Completed: Patient declined, no reason specified; Pt declined any mobility today due to c/o pain despite max encouragement and education on importance of OOB mobility.   Derek MoundKellyn R [name] Seline Enzor, PTA Pager: [phone]   03/13/2016, 4:29 PM

## 2016-03-13 NOTE — Consult Note (Signed)
Chief Complaint: Patient was seen in consultation today for Lumbar 1 vertebroplasty at the request of Dr Thad Ranger  Referring Physician(s): Dr Thad Ranger  Supervising Physician: Julieanne Cotton  Patient Status: Inpatient  History of Present Illness: Tara Frederick is a 82 y.o. female   Twisted and felt sudden back pain mid July Mid to low back pain Hurts with going from sit ti stand mostly Was taking pain meds and became horribly constipated which is what brought pt to ED Pt has since had relief from constipation Back pain worsening  MRI 8/4: IMPRESSION: Superior endplate compression fracture of L1 with up to 70% vertebral body height loss is subacute. The fracture is present on the prior CT scan from 03/04/2016. There is no acute fracture. Status post left laminotomy at L4-5. Disc contacts the descending left L5 root in the lateral recess without compression or displacement. Mild right foraminal narrowing is seen at this level. Please see above for description of other levels  Request for L1 VP/KP per Dr Isidoro Donning Dr Corliss Skains has reviewed imaging and approves procedure  Past Medical History:  Diagnosis Date  . Asthma   . Benign essential HTN 03/04/2016  . GERD (gastroesophageal reflux disease)   . Glaucoma   . HTN (hypertension)     Past Surgical History:  Procedure Laterality Date  . APPENDECTOMY    . HEMORRHOID SURGERY     x2  . TOTAL ABDOMINAL HYSTERECTOMY W/ BILATERAL SALPINGOOPHORECTOMY      Allergies: Toprol xl [metoprolol]; Actonel [risedronate sodium]; Biaxin [clarithromycin]; Ceftin [cefuroxime]; Codeine; Diuretic [buchu-cornsilk-ch grass-hydran]; Evista [raloxifene]; Fosamax [alendronate sodium]; Lortab [hydrocodone-acetaminophen]; Macrobid [nitrofurantoin]; Morphine and related; Neosporin [neomycin-bacitracin zn-polymyx]; Penicillins; Prednisone; Tetracyclines & related; and Tramadol  Medications: Prior to Admission medications   Medication  Sig Start Date End Date Taking? Authorizing Provider  albuterol (PROVENTIL HFA;VENTOLIN HFA) 108 (90 Base) MCG/ACT inhaler Inhale 1 puff into the lungs every 6 (six) hours as needed for wheezing or shortness of breath.   Yes Historical Provider, MD  amLODipine (NORVASC) 5 MG tablet Take 5 mg by mouth daily.   Yes Historical Provider, MD  carboxymethylcellulose (REFRESH PLUS) 0.5 % SOLN Place 1 drop into both eyes 3 (three) times daily as needed.   Yes Historical Provider, MD  denosumab (PROLIA) 60 MG/ML SOLN injection Inject 60 mg into the skin every 6 (six) months. Administer in upper arm, thigh, or abdomen   Yes Historical Provider, MD  ergocalciferol (VITAMIN D2) 50000 units capsule Take 50,000 Units by mouth every 30 (thirty) days.   Yes Historical Provider, MD  erythromycin ophthalmic ointment Place 1 application into both eyes at bedtime.   Yes Historical Provider, MD  fexofenadine (ALLEGRA) 180 MG tablet Take 180 mg by mouth daily.   Yes Historical Provider, MD  latanoprost (XALATAN) 0.005 % ophthalmic solution Place 1 drop into the left eye at bedtime.   Yes Historical Provider, MD  Multiple Vitamins-Minerals (MULTIVITAMIN WITH MINERALS) tablet Take 1 tablet by mouth daily.   Yes Historical Provider, MD  pantoprazole (PROTONIX) 40 MG tablet Take 40 mg by mouth 2 (two) times daily.   Yes Historical Provider, MD     Family History  Problem Relation Age of Onset  . Cancer Sister     Unknown type  . Stomach cancer Brother   . Leukemia Brother     Social History   Social History  . Marital status: N/A    Spouse name: N/A  . Number of children: N/A  . Years  of education: N/A   Occupational History  . retired    Social History Main Topics  . Smoking status: Never Smoker  . Smokeless tobacco: Never Used  . Alcohol use No  . Drug use: No  . Sexual activity: Not Asked   Other Topics Concern  . None   Social History Narrative  . None    Review of Systems: A 12 point ROS  discussed and pertinent positives are indicated in the HPI above.  All other systems are negative.  Review of Systems  Constitutional: Positive for activity change. Negative for appetite change, fatigue and fever.  Respiratory: Negative for shortness of breath.   Gastrointestinal: Positive for abdominal pain and constipation.  Musculoskeletal: Positive for back pain and gait problem.  Psychiatric/Behavioral: Negative for behavioral problems and confusion.    Vital Signs: BP (!) 118/53 (BP Location: Right Arm)   Pulse 88   Temp 98.9 F (37.2 C) (Oral)   Resp 18   Ht 5\' 2"  (1.575 m)   Wt 129 lb 13.6 oz (58.9 kg)   SpO2 95%   Physical Exam  Constitutional: She is oriented to person, place, and time. She appears well-nourished.  Cardiovascular: Normal rate, regular rhythm and normal heart sounds.   Pulmonary/Chest: Effort normal and breath sounds normal.  Abdominal: Soft. Bowel sounds are normal. There is tenderness.  Musculoskeletal: Normal range of motion.  Neurological: She is alert and oriented to person, place, and time.  Skin: Skin is warm and dry.  Psychiatric: She has a normal mood and affect. Her behavior is normal. Judgment and thought content normal.  Nursing note and vitals reviewed.   Mallampati Score:  MD Evaluation Airway: WNL Heart: WNL Abdomen: WNL Chest/ Lungs: WNL ASA  Classification: 3 Mallampati/Airway Score: One  Imaging: Dg Chest 2 View  Result Date: 03/04/2016 CLINICAL DATA:  Leukocytosis. EXAM: CHEST  2 VIEW COMPARISON:  None. FINDINGS: Small bilateral effusions. The heart, hila, an mediastinum are unremarkable. No pulmonary nodules or masses. No focal infiltrates. Mild pleural thickening laterally in the right chest, likely chronic. No other acute abnormalities. IMPRESSION: Small effusions and underlying atelectasis. Electronically Signed   By: Gerome Sam III M.D   On: 03/04/2016 11:29  Mr Lumbar Spine Wo Contrast  Result Date:  03/10/2016 CLINICAL DATA:  Low back and bilateral hip pain. Status post blow to the back on a commode yesterday. Initial encounter. EXAM: MRI LUMBAR SPINE WITHOUT CONTRAST TECHNIQUE: Multiplanar, multisequence MR imaging of the lumbar spine was performed. No intravenous contrast was administered. COMPARISON:  CT abdomen and pelvis 03/04/2016. FINDINGS: Segmentation:  Unremarkable. Alignment: Trace anterolisthesis L3 on L4 is identified. Otherwise unremarkable. Vertebrae: The patient has a subacute fracture of L1 with vertebral body height loss of up to 70% centrally. Marrow edema is seen within the vertebral body. The fracture is identified on the prior CT. No new fracture. Scattered degenerative endplate signal change is seen. Conus medullaris: Extends to the L2 level and appears normal. Paraspinal and other soft tissues: Unremarkable. Disc levels: T11-12 is imaged in the sagittal plane only and negative. T12-L1: There is some bony retropulsion off the superior endplate of L1 but the central canal and foramina are open. L1-2:  Mild ligamentum flavum thickening.  Otherwise negative. L2-3: Ligamentum flavum thickening and a minimal disc bulge are seen. There is facet degenerative disease. The central canal and foramina are open. L3-4: Shallow disc bulge with a superimposed small central protrusion. There is some ligamentum flavum thickening. The central canal and  foramina are open. L4-5: Left laminotomy defect is identified. There is a shallow disc bulge and superimposed left lateral recess protrusion. Disc contacts the left L5 root in lateral recess without compression or displacement. The thecal sac is widely patent. Mild right foraminal narrowing is noted. The left foramen is open. L5-S1: Small right lateral recess and foraminal protrusion without stenosis. IMPRESSION: Superior endplate compression fracture of L1 with up to 70% vertebral body height loss is subacute. The fracture is present on the prior CT scan  from 03/04/2016. There is no acute fracture. Status post left laminotomy at L4-5. Disc contacts the descending left L5 root in the lateral recess without compression or displacement. Mild right foraminal narrowing is seen at this level. Please see above for description of other levels. Electronically Signed   By: Drusilla Kanner M.D.   On: 03/10/2016 15:50   Ct Abdomen Pelvis W Contrast  Result Date: 03/04/2016 CLINICAL DATA:  82 year old female with abdominal pain and distention and severe constipation. EXAM: CT ABDOMEN AND PELVIS WITH CONTRAST TECHNIQUE: Multidetector CT imaging of the abdomen and pelvis was performed using the standard protocol following bolus administration of intravenous contrast. CONTRAST:  80mL ISOVUE-300 IOPAMIDOL (ISOVUE-300) INJECTION 61% COMPARISON:  03/04/2016 FINDINGS: Lower chest: Mild cardiomegaly noted. A moderate hiatal hernia is noted. There is filling defect within this hernia which could represent food but difficult to exclude a mass. Very small bilateral pleural effusions and mild bibasilar atelectasis noted. Hepatobiliary: The liver and gallbladder are unremarkable. There is no evidence of biliary dilatation. Pancreas: Atrophic without other significant abnormality. Spleen: Unremarkable Adrenals/Urinary Tract: Bilateral renal atrophy identified. There is no evidence of hydronephrosis. The adrenal glands and bladder are unremarkable. Stomach/Bowel: A very large amount of stool is noted within the cecum, ascending colon, transverse colon and proximal descending colon. Gas and fluid within the cecum is also identified which measures up to 9.5 cm in greatest diameter. There is a relative transition point in the mid descending colon without definite colonic abnormality. No wall thickening is identified. There is no evidence of small bowel dilatation. Vascular/Lymphatic: Aortic atherosclerotic calcifications noted without aneurysm. No enlarged lymph nodes are identified.  Reproductive: The patient is status post hysterectomy. No adnexal masses identified. Other: No ascites, pneumoperitoneum or abscess. Musculoskeletal: Degenerative changes within the lumbar spine identified. 25% superior endplate compression fracture of L1 is age indeterminate. Mild bony retropulsion noted. IMPRESSION: Distended cecum, ascending colon, transverse colon and proximal descending colon filled with large amount of stool. Relative transition point in the mid descending colon without definite colonic abnormality. Consider direct inspection. No evidence of bowel wall thickening, small bowel obstruction or pneumoperitoneum. Moderate hiatal hernia with filling defect, which could represent feet but a mass is not entirely excluded. Consider direct inspection as indicated. 25% superior endplate compression fracture of L1- indeterminate. Very small bilateral pleural effusions with bibasilar atelectasis. Cardiomegaly. Electronically Signed   By: Harmon Pier M.D.   On: 03/04/2016 16:36  Dg Abd 2 Views  Result Date: 03/07/2016 CLINICAL DATA:  Abdominal discomfort EXAM: ABDOMEN - 2 VIEW COMPARISON:  03/06/2016 FINDINGS: Nasogastric catheter is again seen and stable. Scattered large and small bowel gas is noted. Persistent dilatation is noted in the colon on the right although the degree of dilatation has improved significantly now 10.7 cm in the right mid abdomen. No free air is seen. No bony abnormality is noted. IMPRESSION: Mild persistent right colonic dilatation although significantly improved from the prior exam. Electronically Signed   By: Alcide Clever  M.D.   On: 03/07/2016 08:02   Dg Abd 2 Views  Result Date: 03/06/2016 CLINICAL DATA:  82 year old female with obstipation. Subsequent encounter. EXAM: ABDOMEN - 2 VIEW COMPARISON:  03/05/2016 plain film exam.  03/04/2016 FINDINGS: Nasogastric tube is in place. Tip projects over the left upper quadrant. Side hole just below the diaphragm level. Patient has  a hiatal hernia as noted on recent CT. Gas distended colon. Dilated cecum difficult adequately measure as there may be a superior medial displacement of the cecum. Using similar landmarks to recent plain film examination. Dilated proximal right colon/cecum now spanning over 19.2 cm versus prior 14.3 cm. Progressive distention of gas distended transverse colon measuring up to 8.2 cm versus prior 6.7 cm. Cannot exclude obstructing lesion.  Please see recent CT report. No free intraperitoneal air noted on decubitus view. IMPRESSION: Progressive distension of the right colon/cecum and transverse colon as noted above. Electronically Signed   By: Lacy DuverneySteven  Olson M.D.   On: 03/06/2016 08:00  Dg Abd 2 Views  Result Date: 03/05/2016 CLINICAL DATA:  Obstipation EXAM: ABDOMEN - 2 VIEW COMPARISON:  CT 03/04/2016 FINDINGS: Diffuse gaseous distention of the colon, most pronounced involving the cecum. Cecum measures approximately 14 cm in diameter. When the cecal diameter was measured on the scout image on prior study, this measured 13 cm. No free air organomegaly. IMPRESSION: Marked gaseous distention of the colon, particularly cecum. This is similar to prior CT. Electronically Signed   By: Charlett NoseKevin  Dover M.D.   On: 03/05/2016 09:05  Dg Abd 2 Views  Result Date: 03/04/2016 CLINICAL DATA:  Leukocytosis and constipation EXAM: ABDOMEN - 2 VIEW COMPARISON:  None. FINDINGS: No free air, portal venous gas, or pneumatosis. Small effusions in the lung bases. Markedly dilated colon primarily in the right lower abdomen with air-fluid levels on the upright view. The most dilated loop measures up to 11.7 cm increasing the risk of perforation. The more distal loops of colon are relatively decompressed but air does extend into the rectum. No pneumatosis. No other acute abnormalities. IMPRESSION: There is a markedly dilated loop of colon in the right side of the abdomen. A sigmoid volvulus is not excluded. Recommend a CT scan for further  evaluation. Findings being called to Dr. Maisie Fushomas. Electronically Signed   By: Gerome Samavid  Williams III M.D   On: 03/04/2016 11:39  Dg Basil DessNaso G Tube Plc W/fl W/rad  Result Date: 03/05/2016 CLINICAL DATA:  Colonic distention. Request for nasogastric tube placement. Difficult to place a nasogastric tube on the floor. EXAM: NASO G TUBE PLACEMENT WITH FL AND WITH RAD CONTRAST:  28 mL of Gastrografin injected through nasogastric tube FLUOROSCOPY TIME:  If the device does not provide the exposure index: Fluoroscopy Time (in minutes and seconds):  1 minutes and 6 seconds Number of Acquired Images:  2 COMPARISON:  Abdominal CT 03/04/2016 FINDINGS: Both nostrils anesthetized with viscous lidocaine. 14 French nasogastric tube would not easily advance through the right nostril. The tube easily advanced through the left nostril. Initially, the tube appeared to advance into the airway. The tube was pulled back and directed into the esophagus. Tube was advanced into the upper abdomen. Contrast injection confirmed placement in the stomach. IMPRESSION: Successful placement of a nasogastric tube with fluoroscopy. Catheter tip in the stomach. Electronically Signed   By: Richarda OverlieAdam  Henn M.D.   On: 03/05/2016 14:36  Dg Colon W/water Sol Cm  Result Date: 03/08/2016 CLINICAL DATA:  Colonic obstruction. EXAM: WATER SOLUBLE CONTRAST ENEMA TECHNIQUE: Enema was performed  using water-soluble contrast. FLUOROSCOPY TIME:  Radiation Exposure Index (as provided by the fluoroscopic device): 2 minutes If the device does not provide the exposure index: Fluoroscopy Time:  dictate in minutes and seconds Number of Acquired Images:  6 COMPARISON:  CT 03/04/2016.  Plain films 03/07/2016. FINDINGS: There is a slow gradual caliber change of the colon from the sigmoid colon into the descending colon. Mild distention of the transverse colon PICC. Cecum is moderately distended with gas. There is no colonic obstruction noted. Scattered sigmoid diverticula.  IMPRESSION: No evidence of colonic obstruction. Scattered sigmoid diverticulosis. Electronically Signed   By: Charlett Nose M.D.   On: 03/08/2016 15:33    Labs:  CBC:  Recent Labs  03/08/16 1013 03/11/16 0521 03/12/16 0627 03/13/16 0534  WBC 9.9 8.6 10.1 11.1*  HGB 11.0* 11.7* 11.0* 10.9*  HCT 32.7* 34.3* 32.3* 32.5*  PLT 338 407* 314 400    COAGS:  Recent Labs  03/12/16 0627  INR 1.16  APTT 41*    BMP:  Recent Labs  03/08/16 0909 03/11/16 0521 03/12/16 0627 03/13/16 0534  NA 131* 132* 130* 133*  K 3.5 2.8* 2.8* 4.8  CL 96* 89* 88* 97*  CO2 23 30 30 28   GLUCOSE 98 91 101* 95  BUN 8 8 21* 19  CALCIUM 9.8 10.5* 10.2 10.4*  CREATININE 0.74 0.72 1.22* 0.98  GFRNONAA >60 >60 38* 50*  GFRAA >60 >60 44* 58*    LIVER FUNCTION TESTS: No results for input(s): BILITOT, AST, ALT, ALKPHOS, PROT, ALBUMIN in the last 8760 hours.  TUMOR MARKERS: No results for input(s): AFPTM, CEA, CA199, CHROMGRNA in the last 8760 hours.  Assessment and Plan:  Lumbar 1 painful fracture Scheduled tentatively for L1 Vertebroplasty in am Insurance is pending Pt aware must wait for Insurance approval Risks and Benefits discussed with the patient including, but not limited to education regarding the natural healing process of compression fractures without intervention, bleeding, infection, cement migration which may cause spinal cord damage, paralysis, pulmonary embolism or even death. All of the patient's questions were answered, patient is agreeable to proceed. Consent signed and in chart.    Thank you for this interesting consult.  I greatly enjoyed meeting Tara Frederick and look forward to participating in their care.  A copy of this report was sent to the requesting provider on this date.  Electronically Signed: Kallie Depolo A 03/13/2016, 10:59 AM   I spent a total of 40 Minutes    in face to face in clinical consultation, greater than 50% of which was counseling/coordinating  care for Lumbar 1 VP

## 2016-03-13 NOTE — Progress Notes (Signed)
OT Cancellation Note  Patient Details Name: Tara Frederick MRN: [id number] DOB: 01/12/1924   Cancelled Treatment:    Reason Eval/Treat Not Completed: Pain limiting ability to participate. Pt declining therapy this session and stating, "I'm not doing therapy until after my procedure." OT attempting to educating pt on importance of participating but pt continued to decline.   Lowella Gripittman, Elwanda Moger L, Ms, OTR/L 03/13/2016, 3:50 PM

## 2016-03-14 ENCOUNTER — Inpatient Hospital Stay (HOSPITAL_COMMUNITY): Payer: Medicare Other

## 2016-03-14 HISTORY — PX: IR GENERIC HISTORICAL: IMG1180011

## 2016-03-14 LAB — CBC
HEMATOCRIT: 32 % — AB (ref 36.0–46.0)
Hemoglobin: 10.5 g/dL — ABNORMAL LOW (ref 12.0–15.0)
MCH: 29.1 pg (ref 26.0–34.0)
MCHC: 32.8 g/dL (ref 30.0–36.0)
MCV: 88.6 fL (ref 78.0–100.0)
Platelets: 409 10*3/uL — ABNORMAL HIGH (ref 150–400)
RBC: 3.61 MIL/uL — ABNORMAL LOW (ref 3.87–5.11)
RDW: 13.4 % (ref 11.5–15.5)
WBC: 8.8 10*3/uL (ref 4.0–10.5)

## 2016-03-14 MED ORDER — HYDROMORPHONE HCL 1 MG/ML IJ SOLN
INTRAMUSCULAR | Status: AC
  Filled 2016-03-14: qty 2

## 2016-03-14 MED ORDER — FENTANYL CITRATE (PF) 100 MCG/2ML IJ SOLN
INTRAMUSCULAR | Status: DC | PRN
  Administered 2016-03-14 (×2): 25 ug via INTRAVENOUS

## 2016-03-14 MED ORDER — SODIUM CHLORIDE 0.9 % IV SOLN
INTRAVENOUS | Status: AC
  Administered 2016-03-14: 17:00:00 via INTRAVENOUS

## 2016-03-14 MED ORDER — MIDAZOLAM HCL 2 MG/2ML IJ SOLN
INTRAMUSCULAR | Status: DC | PRN
  Administered 2016-03-14 (×2): 1 mg via INTRAVENOUS

## 2016-03-14 MED ORDER — HYDROMORPHONE HCL 1 MG/ML IJ SOLN
INTRAMUSCULAR | Status: DC | PRN
  Administered 2016-03-14: 1 mg via INTRAVENOUS

## 2016-03-14 MED ORDER — TOBRAMYCIN SULFATE 1.2 G IJ SOLR
INTRAMUSCULAR | Status: DC | PRN
  Administered 2016-03-14: 1.2 g via TOPICAL

## 2016-03-14 MED ORDER — BUPIVACAINE HCL (PF) 0.25 % IJ SOLN
INTRAMUSCULAR | Status: DC | PRN
  Administered 2016-03-14: 20 mL

## 2016-03-14 MED ORDER — SODIUM CHLORIDE 0.9 % IV SOLN
INTRAVENOUS | Status: DC | PRN
  Administered 2016-03-14: 10 mL/h via INTRAVENOUS

## 2016-03-14 MED ORDER — IOPAMIDOL (ISOVUE-300) INJECTION 61%
INTRAVENOUS | Status: AC
  Administered 2016-03-14: 50 mL
  Filled 2016-03-14: qty 50

## 2016-03-14 MED ORDER — FENTANYL CITRATE (PF) 100 MCG/2ML IJ SOLN
INTRAMUSCULAR | Status: AC
  Filled 2016-03-14: qty 2

## 2016-03-14 MED ORDER — BUPIVACAINE HCL (PF) 0.25 % IJ SOLN
INTRAMUSCULAR | Status: AC
  Filled 2016-03-14: qty 30

## 2016-03-14 MED ORDER — MIDAZOLAM HCL 2 MG/2ML IJ SOLN
INTRAMUSCULAR | Status: AC
  Filled 2016-03-14: qty 4

## 2016-03-14 MED ORDER — TOBRAMYCIN SULFATE 1.2 G IJ SOLR
INTRAMUSCULAR | Status: AC
  Filled 2016-03-14: qty 1.2

## 2016-03-14 NOTE — Sedation Documentation (Signed)
Escorted to 5W via bed with RN

## 2016-03-14 NOTE — Progress Notes (Signed)
[facility]                                                                              Patient Demographics  Tara Frederick, is a 82 y.o. female, DOB - September 03, 1923, WUJ:[id number]  Admit date - 03/03/2016   Admitting Physician Starleen Arms, MD  Outpatient Primary MD for the patient is No primary care provider on file.  Outpatient specialists:   LOS - 11  days    No chief complaint on file.      Brief summary   Tara Frederick a 82 y.o.femalewith medical history significant of HTN, GERD, degenerative disk disease. Patient given narcotics in ED last week for back pain. Since then she has had severe constipation. Took narcotic last on Sunday. No BM since then. Presented to [facility] 3 days ago. Was admitted. CT scan at that time showed severe constipation. Attempts made to treat medically include: 2L golytely, miralax BID, dulcolax 20mg  PO daily, senna, multiple fleet enemas, and 2 attempts at manual disimpaction (no stool in rectum). All of the above attempts to treat medically have failed, and On the day of admission, patient finally developed nausea and ultimately numerous episodes of vomiting on ambulance ride.   Patient was admitted given oral MiraLAX and some enemas with no significant improvement. Abdominal films showed worsening cecal dilatation. GI was consulted. A G-tube placed on the fluoroscopy. Patient given every 2 hour MiraLAX and tap water enemas. Patient finally with multiple bowel movements.Gastrografin study was negative for any obstructive lesions Subsequently patient again started having severe back pain, prompting MRI of the lumbar spine which showed compression fracture of the L1. Interventional radiology was consulted for kyphoplasty due to severe back pain. Patient undergoing kyphoplasty on 8/8.    Assessment & Plan    Severe obstipation - Patient's last bowel movement was July 12 after receiving narcotic pain medications for back  injury, Presented with worsening abdominal distention and inability to have bowel movements despite multiple laxatives and multiple Fleet enemas as well as an attempt at manual disimpaction with no stool noted in the rectal vault the outside hospital.  -  Plain films of the abdomen with a markedly dilated loop of colon in the right side of the abdomen measuring approximately 11.7 cm. Increasing risk for perforation. Concern for possible sigmoid volvulus. -  CT abdomen and pelvis done 03/04/2016 with a distended cecum measuring 9.5 cm, ascending colon transverse colon and proximal descending colon filled with large amount of stool. 25% superior endplate compression fracture of L1 indeterminate.  - multiple attempts at NG tube placement the night of 03/04/2016 was unsuccessful. Patient also had multiple tap water enemas were no results. NG tube placed under fluoroscopy per radiology 03/05/2016. NG tube placed to suction.  - Subsequently, patient had multiple BMs on 7/30-8/1 while on every 2 hours MiraLAX with improvement with abdominal distention and abdominal pain. Abdominal x-rays with improvement. Significant clinical improvement. NG tube was clamped - Patient was started on clears, GI has been following closely.  - Gastrografin enema on 8/2 negative for any obstructing lesions - Patient tolerating solid diet - continue current bowel regimen with  MiraLAX TID, Senokot-S and Dulcolax.   Severe back pain Patient complaining of low back pain, stated that this is what led to her original complaint of severe constipation when patient was started on pain medications - placed on tramadol, - Discontinue Toradol and naproxen due to renal insufficiency. Creatinine 1.2 improved to 0.98 -MRI L spine showed subacute compression fracture of L1, d/w patient's daughter, agreed for kyphoplasty.  - IR planned kyphoplasty today   probable UTI Urine cultures with multiple morphotypesCompleted IV  Levaquin  leukocytosis Likely secondary to problem #2 and reactive leukocytosis. - Currently no leukocytosis, urine cultures with multiple morphotypes, Completed IV Levaquin   GERD Continue PPI   hypertension Continue Norvasc.    Code Status: Full  DVT Prophylaxis:  Lovenox  Family Communication: Discussed in detail with the patient, all imaging results, lab results explained to the patient. Discussed with daughter on the phone today    Disposition Plan: Will likely need skilled nursing study, was living at home alone prior  Time Spent in minutes 25 minutes  Procedures:   CXR 03/04/2016  KUB 03/04/2016  CT abdomen and pelvis 03/04/2016  NG tube placement under fluoroscopy 03/05/2016  Consultants:   Gastroenterology surgery  Antimicrobials:   IV levaquin   Medications  Scheduled Meds: . acetaminophen  1,000 mg Oral TID  . amLODipine  5 mg Oral Daily  . bupivacaine (PF)      . cyclobenzaprine  5 mg Oral TID  . fentaNYL      . [START ON 03/15/2016] heparin subcutaneous  5,000 Units Subcutaneous Q8H  . HYDROmorphone      . latanoprost  1 drop Left Eye QHS  . lidocaine  1 patch Transdermal Q24H  . midazolam      . pantoprazole  40 mg Oral Q0600  . polyethylene glycol  17 g Oral TID  . senna-docusate  1 tablet Oral BID  . tobramycin       Continuous Infusions: . sodium chloride 10 mL/hr at 03/13/16 1900  . sodium chloride 10 mL/hr (03/14/16 1137)   PRN Meds:.sodium chloride, acetaminophen, bupivacaine (PF), fentaNYL, HYDROmorphone (DILAUDID) injection, HYDROmorphone, midazolam, ondansetron (ZOFRAN) IV, phenol, prochlorperazine, tobramycin   Antibiotics   Anti-infectives    Start     Dose/Rate Route Frequency Ordered Stop   03/14/16 1238  tobramycin (NEBCIN) powder      Topical As needed 03/14/16 1238     03/14/16 1158  tobramycin (NEBCIN) 1.2 g powder    Comments:  Dette, Cheryl   : cabinet override      03/14/16 1158 03/15/16 0014   03/14/16 0400   clindamycin (CLEOCIN) IVPB 600 mg     600 mg 100 mL/hr over 30 Minutes Intravenous To Radiology 03/13/16 1217 03/14/16 1207   03/08/16 0800  levofloxacin (LEVAQUIN) IVPB 750 mg  Status:  Discontinued     750 mg 100 mL/hr over 90 Minutes Intravenous Every 48 hours 03/06/16 1029 03/11/16 1013   03/04/16 0800  levofloxacin (LEVAQUIN) IVPB 750 mg  Status:  Discontinued     750 mg 100 mL/hr over 90 Minutes Intravenous Every 24 hours 03/03/16 2221 03/06/16 1029        Subjective:   Indira Sorenson was seen and examined today. Still has back pain, awaiting kyphoplasty procedure. BM yesterday. no nausea or vomiting. No fevers or chills.  Patient denies dizziness, chest pain, shortness of breath, new weakness, numbess, tingling. No acute events overnight.    Objective:   Vitals:   03/14/16 1255 03/14/16  1300 03/14/16 1305 03/14/16 1315  BP: 128/74 129/65 125/66   Pulse: (!) 114 (!) 115 (!) 117   Resp: 14 13 13    Temp:      TempSrc:      SpO2: 99% 100% 95% 94%  Weight:      Height:        Intake/Output Summary (Last 24 hours) at 03/14/16 1323 Last data filed at 03/14/16 1119  Gross per 24 hour  Intake          2482.33 ml  Output             1000 ml  Net          1482.33 ml     Wt Readings from Last 3 Encounters:  03/03/16 58.9 kg (129 lb 13.6 oz)     Exam  General: Alert and oriented x 3, NAD  HEENT:    Neck:   Cardiovascular: S1 S2clear, RRR  Respiratory: CTAB  Gastrointestinal: Soft, NT, ND, +BS  Ext: no cyanosis clubbing or edema  Neuro:   Skin: No rashes  Psych: Normal affect and demeanor, alert and oriented x3    Data Reviewed:  I have personally reviewed following labs and imaging studies  Micro Results Recent Results (from the past 240 hour(s))  Urine culture     Status: Abnormal   Collection Time: 03/04/16  6:07 PM  Result Value Ref Range Status   Specimen Description URINE, RANDOM  Final   Special Requests NONE  Final   Culture MULTIPLE  SPECIES PRESENT, SUGGEST RECOLLECTION (A)  Final   Report Status 03/05/2016 FINAL  Final    Radiology Reports Dg Chest 2 View  Result Date: 03/04/2016 CLINICAL DATA:  Leukocytosis. EXAM: CHEST  2 VIEW COMPARISON:  None. FINDINGS: Small bilateral effusions. The heart, hila, an mediastinum are unremarkable. No pulmonary nodules or masses. No focal infiltrates. Mild pleural thickening laterally in the right chest, likely chronic. No other acute abnormalities. IMPRESSION: Small effusions and underlying atelectasis. Electronically Signed   By: Gerome Sam III M.D   On: 03/04/2016 11:29  Mr Lumbar Spine Wo Contrast  Result Date: 03/10/2016 CLINICAL DATA:  Low back and bilateral hip pain. Status post blow to the back on a commode yesterday. Initial encounter. EXAM: MRI LUMBAR SPINE WITHOUT CONTRAST TECHNIQUE: Multiplanar, multisequence MR imaging of the lumbar spine was performed. No intravenous contrast was administered. COMPARISON:  CT abdomen and pelvis 03/04/2016. FINDINGS: Segmentation:  Unremarkable. Alignment: Trace anterolisthesis L3 on L4 is identified. Otherwise unremarkable. Vertebrae: The patient has a subacute fracture of L1 with vertebral body height loss of up to 70% centrally. Marrow edema is seen within the vertebral body. The fracture is identified on the prior CT. No new fracture. Scattered degenerative endplate signal change is seen. Conus medullaris: Extends to the L2 level and appears normal. Paraspinal and other soft tissues: Unremarkable. Disc levels: T11-12 is imaged in the sagittal plane only and negative. T12-L1: There is some bony retropulsion off the superior endplate of L1 but the central canal and foramina are open. L1-2:  Mild ligamentum flavum thickening.  Otherwise negative. L2-3: Ligamentum flavum thickening and a minimal disc bulge are seen. There is facet degenerative disease. The central canal and foramina are open. L3-4: Shallow disc bulge with a superimposed small  central protrusion. There is some ligamentum flavum thickening. The central canal and foramina are open. L4-5: Left laminotomy defect is identified. There is a shallow disc bulge and superimposed left lateral recess protrusion. Disc  contacts the left L5 root in lateral recess without compression or displacement. The thecal sac is widely patent. Mild right foraminal narrowing is noted. The left foramen is open. L5-S1: Small right lateral recess and foraminal protrusion without stenosis. IMPRESSION: Superior endplate compression fracture of L1 with up to 70% vertebral body height loss is subacute. The fracture is present on the prior CT scan from 03/04/2016. There is no acute fracture. Status post left laminotomy at L4-5. Disc contacts the descending left L5 root in the lateral recess without compression or displacement. Mild right foraminal narrowing is seen at this level. Please see above for description of other levels. Electronically Signed   By: Drusilla Kanner M.D.   On: 03/10/2016 15:50   Ct Abdomen Pelvis W Contrast  Result Date: 03/04/2016 CLINICAL DATA:  82 year old female with abdominal pain and distention and severe constipation. EXAM: CT ABDOMEN AND PELVIS WITH CONTRAST TECHNIQUE: Multidetector CT imaging of the abdomen and pelvis was performed using the standard protocol following bolus administration of intravenous contrast. CONTRAST:  80mL ISOVUE-300 IOPAMIDOL (ISOVUE-300) INJECTION 61% COMPARISON:  03/04/2016 FINDINGS: Lower chest: Mild cardiomegaly noted. A moderate hiatal hernia is noted. There is filling defect within this hernia which could represent food but difficult to exclude a mass. Very small bilateral pleural effusions and mild bibasilar atelectasis noted. Hepatobiliary: The liver and gallbladder are unremarkable. There is no evidence of biliary dilatation. Pancreas: Atrophic without other significant abnormality. Spleen: Unremarkable Adrenals/Urinary Tract: Bilateral renal atrophy  identified. There is no evidence of hydronephrosis. The adrenal glands and bladder are unremarkable. Stomach/Bowel: A very large amount of stool is noted within the cecum, ascending colon, transverse colon and proximal descending colon. Gas and fluid within the cecum is also identified which measures up to 9.5 cm in greatest diameter. There is a relative transition point in the mid descending colon without definite colonic abnormality. No wall thickening is identified. There is no evidence of small bowel dilatation. Vascular/Lymphatic: Aortic atherosclerotic calcifications noted without aneurysm. No enlarged lymph nodes are identified. Reproductive: The patient is status post hysterectomy. No adnexal masses identified. Other: No ascites, pneumoperitoneum or abscess. Musculoskeletal: Degenerative changes within the lumbar spine identified. 25% superior endplate compression fracture of L1 is age indeterminate. Mild bony retropulsion noted. IMPRESSION: Distended cecum, ascending colon, transverse colon and proximal descending colon filled with large amount of stool. Relative transition point in the mid descending colon without definite colonic abnormality. Consider direct inspection. No evidence of bowel wall thickening, small bowel obstruction or pneumoperitoneum. Moderate hiatal hernia with filling defect, which could represent feet but a mass is not entirely excluded. Consider direct inspection as indicated. 25% superior endplate compression fracture of L1- indeterminate. Very small bilateral pleural effusions with bibasilar atelectasis. Cardiomegaly. Electronically Signed   By: Harmon Pier M.D.   On: 03/04/2016 16:36  Dg Abd 2 Views  Result Date: 03/07/2016 CLINICAL DATA:  Abdominal discomfort EXAM: ABDOMEN - 2 VIEW COMPARISON:  03/06/2016 FINDINGS: Nasogastric catheter is again seen and stable. Scattered large and small bowel gas is noted. Persistent dilatation is noted in the colon on the right although the  degree of dilatation has improved significantly now 10.7 cm in the right mid abdomen. No free air is seen. No bony abnormality is noted. IMPRESSION: Mild persistent right colonic dilatation although significantly improved from the prior exam. Electronically Signed   By: Alcide Clever M.D.   On: 03/07/2016 08:02   Dg Abd 2 Views  Result Date: 03/06/2016 CLINICAL DATA:  82 year old female with  obstipation. Subsequent encounter. EXAM: ABDOMEN - 2 VIEW COMPARISON:  03/05/2016 plain film exam.  03/04/2016 FINDINGS: Nasogastric tube is in place. Tip projects over the left upper quadrant. Side hole just below the diaphragm level. Patient has a hiatal hernia as noted on recent CT. Gas distended colon. Dilated cecum difficult adequately measure as there may be a superior medial displacement of the cecum. Using similar landmarks to recent plain film examination. Dilated proximal right colon/cecum now spanning over 19.2 cm versus prior 14.3 cm. Progressive distention of gas distended transverse colon measuring up to 8.2 cm versus prior 6.7 cm. Cannot exclude obstructing lesion.  Please see recent CT report. No free intraperitoneal air noted on decubitus view. IMPRESSION: Progressive distension of the right colon/cecum and transverse colon as noted above. Electronically Signed   By: Lacy Duverney M.D.   On: 03/06/2016 08:00  Dg Abd 2 Views  Result Date: 03/05/2016 CLINICAL DATA:  Obstipation EXAM: ABDOMEN - 2 VIEW COMPARISON:  CT 03/04/2016 FINDINGS: Diffuse gaseous distention of the colon, most pronounced involving the cecum. Cecum measures approximately 14 cm in diameter. When the cecal diameter was measured on the scout image on prior study, this measured 13 cm. No free air organomegaly. IMPRESSION: Marked gaseous distention of the colon, particularly cecum. This is similar to prior CT. Electronically Signed   By: Charlett Nose M.D.   On: 03/05/2016 09:05  Dg Abd 2 Views  Result Date: 03/04/2016 CLINICAL DATA:   Leukocytosis and constipation EXAM: ABDOMEN - 2 VIEW COMPARISON:  None. FINDINGS: No free air, portal venous gas, or pneumatosis. Small effusions in the lung bases. Markedly dilated colon primarily in the right lower abdomen with air-fluid levels on the upright view. The most dilated loop measures up to 11.7 cm increasing the risk of perforation. The more distal loops of colon are relatively decompressed but air does extend into the rectum. No pneumatosis. No other acute abnormalities. IMPRESSION: There is a markedly dilated loop of colon in the right side of the abdomen. A sigmoid volvulus is not excluded. Recommend a CT scan for further evaluation. Findings being called to Dr. Maisie Fus. Electronically Signed   By: Gerome Sam III M.D   On: 03/04/2016 11:39  Dg Basil Dess Tube Plc W/fl W/rad  Result Date: 03/05/2016 CLINICAL DATA:  Colonic distention. Request for nasogastric tube placement. Difficult to place a nasogastric tube on the floor. EXAM: NASO G TUBE PLACEMENT WITH FL AND WITH RAD CONTRAST:  28 mL of Gastrografin injected through nasogastric tube FLUOROSCOPY TIME:  If the device does not provide the exposure index: Fluoroscopy Time (in minutes and seconds):  1 minutes and 6 seconds Number of Acquired Images:  2 COMPARISON:  Abdominal CT 03/04/2016 FINDINGS: Both nostrils anesthetized with viscous lidocaine. 14 French nasogastric tube would not easily advance through the right nostril. The tube easily advanced through the left nostril. Initially, the tube appeared to advance into the airway. The tube was pulled back and directed into the esophagus. Tube was advanced into the upper abdomen. Contrast injection confirmed placement in the stomach. IMPRESSION: Successful placement of a nasogastric tube with fluoroscopy. Catheter tip in the stomach. Electronically Signed   By: Richarda Overlie M.D.   On: 03/05/2016 14:36  Dg Colon W/water Sol Cm  Result Date: 03/08/2016 CLINICAL DATA:  Colonic obstruction. EXAM:  WATER SOLUBLE CONTRAST ENEMA TECHNIQUE: Enema was performed using water-soluble contrast. FLUOROSCOPY TIME:  Radiation Exposure Index (as provided by the fluoroscopic device): 2 minutes If the device does not  provide the exposure index: Fluoroscopy Time:  dictate in minutes and seconds Number of Acquired Images:  6 COMPARISON:  CT 03/04/2016.  Plain films 03/07/2016. FINDINGS: There is a slow gradual caliber change of the colon from the sigmoid colon into the descending colon. Mild distention of the transverse colon PICC. Cecum is moderately distended with gas. There is no colonic obstruction noted. Scattered sigmoid diverticula. IMPRESSION: No evidence of colonic obstruction. Scattered sigmoid diverticulosis. Electronically Signed   By: Charlett NoseKevin  Dover M.D.   On: 03/08/2016 15:33    Lab Data:  CBC:  Recent Labs Lab 03/08/16 1013 03/11/16 0521 03/12/16 0627 03/13/16 0534 03/14/16 0658  WBC 9.9 8.6 10.1 11.1* 8.8  HGB 11.0* 11.7* 11.0* 10.9* 10.5*  HCT 32.7* 34.3* 32.3* 32.5* 32.0*  MCV 88.1 86.0 84.8 87.1 88.6  PLT 338 407* 314 400 409*   Basic Metabolic Panel:  Recent Labs Lab 03/08/16 0909 03/11/16 0521 03/12/16 0627 03/13/16 0534  NA 131* 132* 130* 133*  K 3.5 2.8* 2.8* 4.8  CL 96* 89* 88* 97*  CO2 23 30 30 28   GLUCOSE 98 91 101* 95  BUN 8 8 21* 19  CREATININE 0.74 0.72 1.22* 0.98  CALCIUM 9.8 10.5* 10.2 10.4*  MG 1.5*  --   --   --    GFR: Estimated Creatinine Clearance: 30.8 mL/min (by C-G formula based on SCr of 0.98 mg/dL). Liver Function Tests: No results for input(s): AST, ALT, ALKPHOS, BILITOT, PROT, ALBUMIN in the last 168 hours. No results for input(s): LIPASE, AMYLASE in the last 168 hours. No results for input(s): AMMONIA in the last 168 hours. Coagulation Profile:  Recent Labs Lab 03/12/16 0627  INR 1.16   Cardiac Enzymes: No results for input(s): CKTOTAL, CKMB, CKMBINDEX, TROPONINI in the last 168 hours. BNP (last 3 results) No results for input(s):  PROBNP in the last 8760 hours. HbA1C: No results for input(s): HGBA1C in the last 72 hours. CBG: No results for input(s): GLUCAP in the last 168 hours. Lipid Profile: No results for input(s): CHOL, HDL, LDLCALC, TRIG, CHOLHDL, LDLDIRECT in the last 72 hours. Thyroid Function Tests: No results for input(s): TSH, T4TOTAL, FREET4, T3FREE, THYROIDAB in the last 72 hours. Anemia Panel: No results for input(s): VITAMINB12, FOLATE, FERRITIN, TIBC, IRON, RETICCTPCT in the last 72 hours. Urine analysis:    Component Value Date/Time   COLORURINE AMBER (A) 03/04/2016 0050   APPEARANCEUR CLOUDY (A) 03/04/2016 0050   LABSPEC 1.022 03/04/2016 0050   PHURINE 6.0 03/04/2016 0050   GLUCOSEU NEGATIVE 03/04/2016 0050   HGBUR SMALL (A) 03/04/2016 0050   BILIRUBINUR SMALL (A) 03/04/2016 0050   KETONESUR 15 (A) 03/04/2016 0050   PROTEINUR 30 (A) 03/04/2016 0050   NITRITE NEGATIVE 03/04/2016 0050   LEUKOCYTESUR MODERATE (A) 03/04/2016 0050     Jones Viviani M.D. [facility] 03/14/2016, 1:23 PM  Pager: [phone] Between 7am to 7pm - call Pager - [phone]  After 7pm go to [email or web address] - password [email or web address]  Call night coverage person covering after 7pm

## 2016-03-14 NOTE — Procedures (Signed)
S/P L1 VP 

## 2016-03-14 NOTE — Sedation Documentation (Signed)
Dr D at bs d/w pt and dtr poc.

## 2016-03-14 NOTE — Progress Notes (Signed)
Physical Therapy Treatment Patient Details Name: Tara Frederick MRN: [id number] DOB: 1924-03-06 Today's Date: 03/14/2016    History of Present Illness 82 y.o. female with medical history significant of HTN, GERD, degenerative disk disease.  Patient given narcotics in ED last week for back pain. Presented to [facility] Patient was admitted given oral MiraLAX and some enemas with no significant improvement. Abdominal films showed worsening cecal dilatation. GI was consulted. A G-tube placed on the fluoroscopy. Patient given every 2 hour MiraLAX and tap water enemas. Patient finally with multiple bowel movements. s/p Gastrografin enema 8/2.     PT Comments    Performing as expected post Kyphoplasty.  A little pain, but with good mobility.  Needs work on getting in/out of bed from sidelying.  Pt could benefit from short term rehab before going home alone due to weakness and decreased tolerance for activity.  Follow Up Recommendations  SNF     Equipment Recommendations  None recommended by PT    Recommendations for Other Services       Precautions / Restrictions Precautions Precautions: Fall    Mobility  Bed Mobility Overal bed mobility: Needs Assistance Bed Mobility: Rolling;Sidelying to Sit;Sit to Supine Rolling: Min assist Sidelying to sit: Min assist   Sit to supine: Min assist   General bed mobility comments: couldn't get pt to return to sidelying, will work on it next session  Transfers Overall transfer level: Needs assistance Equipment used: Rolling walker (2 wheeled) Transfers: Sit to/from Stand Sit to Stand: Min guard         General transfer comment: guard for safety, cues for hand placement  Ambulation/Gait Ambulation/Gait assistance: Min assist Ambulation Distance (Feet): 130 Feet Assistive device: Rolling walker (2 wheeled) Gait Pattern/deviations: Step-through pattern Gait velocity: decreased Gait velocity interpretation: Below normal speed for  age/gender General Gait Details: slow, generally steady, but quick to fatigue with gait becoming more effortful.   Stairs            Wheelchair Mobility    Modified Rankin (Stroke Patients Only)       Balance Overall balance assessment: Needs assistance   Sitting balance-Leahy Scale: Good       Standing balance-Leahy Scale: Poor Standing balance comment: reliant more on UE's due to weakness and fatigue                    Cognition Arousal/Alertness: Awake/alert Behavior During Therapy: WFL for tasks assessed/performed Overall Cognitive Status: Within Functional Limits for tasks assessed                      Exercises      General Comments        Pertinent Vitals/Pain Pain Assessment: 0-10 Pain Score: 3  Pain Location: back Pain Descriptors / Indicators: Sore Pain Intervention(s): Limited activity within patient's tolerance;Monitored during session;Repositioned    Home Living                      Prior Function            PT Goals (current goals can now be found in the care plan section) Acute Rehab PT Goals Patient Stated Goal: to return to PLOF PT Goal Formulation: With patient Time For Goal Achievement: 03/23/16 Potential to Achieve Goals: Good Progress towards PT goals: Progressing toward goals    Frequency  Min 3X/week    PT Plan Discharge plan needs to be updated    Co-evaluation  End of Session   Activity Tolerance: Patient tolerated treatment well;Patient limited by fatigue Patient left: in bed;with call bell/phone within reach;with bed alarm set     Time: [id number] PT Time Calculation (min) (ACUTE ONLY): 15 min  Charges:  $Gait Training: 8-22 mins                    G Codes:      Dane Bloch, Eliseo GumKenneth V 03/14/2016, 5:56 PM 03/14/2016  Quinton BingKen Marquavis Hannen, PT [phone]  (pager)

## 2016-03-14 NOTE — Discharge Instructions (Signed)
1.No stooping,bending or lifting more than 10 lbs for 2 weeksd. 2.Use walker to ambulate for 2 weeks. 3.RTC IN 2 weeks

## 2016-03-15 DIAGNOSIS — S32018A Other fracture of first lumbar vertebra, initial encounter for closed fracture: Secondary | ICD-10-CM

## 2016-03-15 MED ORDER — HYDROCODONE-ACETAMINOPHEN 5-325 MG PO TABS
1.0000 | ORAL_TABLET | Freq: Four times a day (QID) | ORAL | Status: DC | PRN
  Administered 2016-03-15 – 2016-03-16 (×4): 1 via ORAL
  Filled 2016-03-15 (×4): qty 1

## 2016-03-15 NOTE — Clinical Social Work Note (Signed)
Clinical Social Work Assessment  Patient Details  Name: Tara Frederick MRN: [id number] Date of Birth: 1924-05-10  Date of referral:  03/15/16               Reason for consult:  Facility Placement                Permission sought to share information with:  Facility Sport and exercise psychologist, Family Supports Permission granted to share information::  Yes, Verbal Permission Granted  Name::     Tax adviser::  SNFs  Relationship::     Contact Information:     Housing/Transportation Living arrangements for the past 2 months:  [place] of Information:  Patient, Adult Children Patient Interpreter Needed:  None Criminal Activity/Legal Involvement Pertinent to Current Situation/Hospitalization:  No - Comment as needed Significant Relationships:  Adult Children Lives with:  Self Do you feel safe going back to the place where you live?  No Need for family participation in patient care:  Yes (Comment)  Care giving concerns:  CSW received referral for possible SNF placement at time of discharge. CSW met with patient and spoke with patient's daughter regarding PT recommendation of SNF placement at time of discharge. Per patient's daughter, patient lives alone and is currently unable to care for herself at home given patient's current physical needs and fall risk. Patient and patient's daughter expressed understanding of PT recommendation and are agreeable to SNF placement at time of discharge. CSW to continue to follow and assist with discharge planning needs.   Social Worker assessment / plan:  CSW spoke with patient and patient's daughter concerning possibility of rehab at [facility] before returning home.  Employment status:  Retired Forensic scientist:  Medicare PT Recommendations:  [place] / Referral to community resources:  [place]  Patient/Family's Response to care:  Patient and patient's daughter recognize need for rehab for a  short period of time before returning home and are agreeable to a SNF. Patient reported preference for [facility] in [place], however, [facility] explained that they are not on [facility]'s system and we are not able to send patient's confidential info to their facility.   Patient/Family's Understanding of and Emotional Response to Diagnosis, Current Treatment, and Prognosis:  Patient/family is realistic regarding therapy needs and expressed being hopeful for SNF placement just for a short time period. No questions/concerns about plan or treatment.    Emotional Assessment Appearance:  Appears stated age Attitude/Demeanor/Rapport:  Other (Appropriate) Affect (typically observed):  Accepting, Appropriate Orientation:  Oriented to Self, Oriented to Place, Oriented to  Time, Oriented to Situation Alcohol / Substance use:  Not Applicable Psych involvement (Current and /or in the community):  No (Comment)  Discharge Needs  Concerns to be addressed:  Care Coordination Readmission within the last 30 days:  No Current discharge risk:  None Barriers to Discharge:  Continued Medical Work up   [employer], [place] 03/15/2016, 7:54 AM

## 2016-03-15 NOTE — Progress Notes (Signed)
Occupational Therapy Treatment Patient Details Name: Tara Frederick MRN: [id number] DOB: Jan 04, 1924 Today's Date: 03/15/2016    History of present illness 82 y.o. female with medical history significant of HTN, GERD, degenerative disk disease.  Patient given narcotics in ED last week for back pain. Presented to [facility] Patient was admitted given oral MiraLAX and some enemas with no significant improvement. Abdominal films showed worsening cecal dilatation. GI was consulted. A G-tube placed on the fluoroscopy. Patient given every 2 hour MiraLAX and tap water enemas. Patient finally with multiple bowel movements. s/p Gastrografin enema 8/2.    OT comments  Upon entering the room, pt supine in bed with family member present in room. Pt agreeable to OT intervention with coaxing. Pt ambulating with RW and steady assist for safety to bathroom for toileting task. Pt needing steady assistance for clothing management and hygiene. Pt needing rest break secondary to fatigue. Pt encouraged to stand at sink to wash hands and brush teeth. Pt reporting, " I can't do this in standing." OT educating pt on importance of standing in order to progress. Pt standing for ~ 5 minutes at sink with min guard and min verbal cues for upright posture while performing grooming tasks. Pt returning to bed at end of session as pt declined sitting in recliner chair. Bed alarm activated and call bell within reach.   Follow Up Recommendations  SNF    Equipment Recommendations  Other (comment) (defer to next venue of care)    Recommendations for Other Services      Precautions / Restrictions Precautions Precautions: Fall Restrictions Weight Bearing Restrictions: No       Mobility Bed Mobility Overal bed mobility: Needs Assistance Bed Mobility: Rolling;Sidelying to Sit;Sit to Supine Rolling: Min assist Sidelying to sit: Min assist   Sit to supine: Min assist      Transfers Overall transfer level: Needs  assistance Equipment used: Rolling walker (2 wheeled) Transfers: Sit to/from Stand Sit to Stand: Min guard    General transfer comment: guard for safety, cues for hand placement    Balance Overall balance assessment: Needs assistance Sitting-balance support: Feet supported;No upper extremity supported Sitting balance-Leahy Scale: Good Sitting balance - Comments: close supervision   Standing balance support: During functional activity Standing balance-Leahy Scale: Poor Standing balance comment: reliant on RW        ADL Overall ADL's : Needs assistance/impaired        Toilet Transfer: Minimal assistance;Regular Toilet;Grab bars;RW   Toileting- Clothing Manipulation and Hygiene: Min guard;Sit to/from stand       Functional mobility during ADLs: Min guard;Rolling walker General ADL Comments: Steady assist for functional ambulation with RW and for balance during clothing management and hygiene after toileting. Pt required increased time for mobility and fatigues easily.                 Cognition   Behavior During Therapy: WFL for tasks assessed/performed Overall Cognitive Status: Within Functional Limits for tasks assessed                       Pertinent Vitals/ Pain       Pain Assessment: 0-10 Pain Score: 5  Pain Location: lower back Pain Descriptors / Indicators: Guarding;Grimacing;Sore Pain Intervention(s): Repositioned;Monitored during session         Frequency Min 2X/week     Progress Toward Goals  OT Goals(current goals can now be found in the care plan section)  Progress towards OT goals: Progressing toward goals  Plan Discharge plan needs to be updated       End of Session Equipment Utilized During Treatment: Rolling walker   Activity Tolerance Patient limited by fatigue   Patient Left in bed;with call bell/phone within reach;with bed alarm set;with family/visitor present           Time: [id number] OT Time Calculation (min): 24  min  Charges: OT General Charges $OT Visit: 1 Procedure OT Treatments $Self Care/Home Management : 23-37 mins (24)  Pittman, Jasten Guyette L, MS, OTR/L 03/15/2016, 2:46 PM

## 2016-03-15 NOTE — Progress Notes (Signed)
[facility]                                                                              Patient Demographics  Tara Frederick, is a 82 y.o. female, DOB - [birth year], ZOX:[id number]  Admit date - 03/03/2016   Admitting Physician Starleen Armsawood S Elgergawy, MD  Outpatient Primary MD for the patient is No primary care provider on file.  Outpatient specialists:   LOS - 12  days    No chief complaint on file.      Brief summary   Tara FisherFrances Hardyis a [age] y.o.femalewith medical history significant of HTN, GERD, degenerative disk disease. Patient given narcotics in ED last week for back pain. Since then she has had severe constipation. Took narcotic last on Sunday. No BM since then. [facility]. Was admitted. CT scan at that time showed severe constipation. Attempts made to treat medically include: 2L golytely, miralax BID, dulcolax 20mg  PO daily, senna, multiple fleet enemas, and 2 attempts at manual disimpaction (no stool in rectum). All of the above attempts to treat medically have failed, and On the day of admission, patient finally developed nausea and ultimately numerous episodes of vomiting on ambulance ride.   Patient was admitted given oral MiraLAX and some enemas with no significant improvement. Abdominal films showed worsening cecal dilatation. GI was consulted. A G-tube placed on the fluoroscopy. Patient given every 2 hour MiraLAX and tap water enemas. Patient finally with multiple bowel movements.Gastrografin study was negative for any obstructive lesions Subsequently patient again started having severe back pain, prompting MRI of the lumbar spine which showed compression fracture of the L1. Interventional radiology was consulted for kyphoplasty due to severe back pain. Patient underwent kyphoplasty on 8/8.    Assessment & Plan    Severe obstipation - Patient's last bowel movement was July 12 after receiving narcotic pain medications for back  injury, Presented with worsening abdominal distention and inability to have bowel movements despite multiple laxatives and multiple Fleet enemas as well as an attempt at manual disimpaction with no stool noted in the rectal vault the outside hospital.  -  Plain films of the abdomen with a markedly dilated loop of colon in the right side of the abdomen measuring approximately 11.7 cm. Increasing risk for perforation. Concern for possible sigmoid volvulus. -  CT abdomen and pelvis done 03/04/2016 with a distended cecum measuring 9.5 cm, ascending colon transverse colon and proximal descending colon filled with large amount of stool. 25% superior endplate compression fracture of L1 indeterminate.  - multiple attempts at NG tube placement the night of 03/04/2016 was unsuccessful. Patient also had multiple tap water enemas were no results. NG tube placed under fluoroscopy per radiology 03/05/2016. NG tube placed to suction.  - Subsequently, patient had multiple BMs on 7/30-8/1 while on every 2 hours MiraLAX with improvement with abdominal distention and abdominal pain. Abdominal x-rays with improvement. Significant clinical improvement. NG tube was clamped - GI consulted - Gastrografin enema on 8/2 negative for any obstructing lesions - Patient tolerating solid diet - continue current bowel regimen with MiraLAX TID, Senokot-S and Dulcolax post discharge -last BM  early am  Severe back pain/L1 compression Fx Patient complaining of low back pain, stated that this is what led to her original complaint of severe constipation when patient was started on pain medications - placed on tramadol, - Discontinued Toradol and naproxen due to renal insufficiency. Creatinine 1.2 improved to 0.98 -MRI L spine showed subacute compression fracture of L1, Dr.Rai d/w patient's daughter, agreed for kyphoplasty.  - s/p kyphoplasty yesterday, still in pain, IR updated pt that might take 2 weeks for max benefit from  procedure  probable UTI Urine cultures with multiple morphotypes, Completed IV Levaquin  leukocytosis Likely secondary to problem #2 and reactive leukocytosis. - Currently no leukocytosis, urine cultures with multiple morphotypes, Completed IV Levaquin   GERD Continue PPI   hypertension Continue Norvasc.  Code Status: Full  DVT Prophylaxis:  Lovenox  Family Communication: None at bedside Disposition Plan: SNF tomorrow  Time Spent in minutes 25 minutes  Procedures:   CXR 03/04/2016  KUB 03/04/2016  CT abdomen and pelvis 03/04/2016  NG tube placement under fluoroscopy 03/05/2016  Consultants:   Gastroenterology surgery  Antimicrobials:   IV levaquin   Medications  Scheduled Meds: . amLODipine  5 mg Oral Daily  . cyclobenzaprine  5 mg Oral TID  . heparin subcutaneous  5,000 Units Subcutaneous Q8H  . latanoprost  1 drop Left Eye QHS  . lidocaine  1 patch Transdermal Q24H  . pantoprazole  40 mg Oral Q0600  . polyethylene glycol  17 g Oral TID  . senna-docusate  1 tablet Oral BID   Continuous Infusions: . sodium chloride 75 mL/hr at 03/14/16 2131  . sodium chloride 10 mL/hr (03/14/16 1137)   PRN Meds:.sodium chloride, acetaminophen, bupivacaine (PF), fentaNYL, HYDROcodone-acetaminophen, HYDROmorphone (DILAUDID) injection, midazolam, ondansetron (ZOFRAN) IV, phenol, prochlorperazine, tobramycin   Antibiotics   Anti-infectives    Start     Dose/Rate Route Frequency Ordered Stop   03/14/16 1238  tobramycin (NEBCIN) powder      Topical As needed 03/14/16 1238     03/14/16 1158  tobramycin (NEBCIN) 1.2 g powder    Comments:  Dette, Cheryl   : cabinet override      03/14/16 1158 03/15/16 0014   03/14/16 0400  clindamycin (CLEOCIN) IVPB 600 mg     600 mg 100 mL/hr over 30 Minutes Intravenous To Radiology 03/13/16 1217 03/14/16 1207   03/08/16 0800  levofloxacin (LEVAQUIN) IVPB 750 mg  Status:  Discontinued     750 mg 100 mL/hr over 90 Minutes Intravenous  Every 48 hours 03/06/16 1029 03/11/16 1013   03/04/16 0800  levofloxacin (LEVAQUIN) IVPB 750 mg  Status:  Discontinued     750 mg 100 mL/hr over 90 Minutes Intravenous Every 24 hours 03/03/16 2221 03/06/16 1029        Subjective:   Still with 7/10- back pain  Objective:   Vitals:   03/14/16 1315 03/14/16 1408 03/14/16 2110 03/15/16 0534  BP:  (!) 125/57 (!) 129/53 (!) 155/74  Pulse:  (!) 102 88 (!) 115  Resp:  Temp:  97.9 F (36.6 C)    TempSrc:  Oral    SpO2: 94% (!) 84% 96% 94%  Weight:      Height:        Intake/Output Summary (Last 24 hours) at 03/15/16 1334 Last data filed at 03/15/16 1254  Gross per 24 hour  Intake          1100.17 ml  Output  1300 ml  Net          -199.83 ml     Wt Readings from Last 3 Encounters:  03/03/16 58.9 kg (129 lb 13.6 oz)     Exam  General: Alert and oriented x 3, NAD  HEENT:  PERRLA  Cardiovascular: S1 S2clear, RRR  Respiratory: CTAB  Gastrointestinal: Soft, NT, ND, +BS  Ext: no cyanosis clubbing or edema  Neuro: non focal  Skin: No rashes  Psych: Normal affect and demeanor, alert and oriented x3    Data Reviewed:  I have personally reviewed following labs and imaging studies  Micro Results No results found for this or any previous visit (from the past 240 hour(s)).  Radiology Reports Dg Chest 2 View  Result Date: 03/04/2016 CLINICAL DATA:  Leukocytosis. EXAM: CHEST  2 VIEW COMPARISON:  None. FINDINGS: Small bilateral effusions. The heart, hila, an mediastinum are unremarkable. No pulmonary nodules or masses. No focal infiltrates. Mild pleural thickening laterally in the right chest, likely chronic. No other acute abnormalities. IMPRESSION: Small effusions and underlying atelectasis. Electronically Signed   By: Gerome Sam III M.D   On: 03/04/2016 11:29  Mr Lumbar Spine Wo Contrast  Result Date: 03/10/2016 CLINICAL DATA:  Low back and bilateral hip pain. Status post blow to the back  on a commode yesterday. Initial encounter. EXAM: MRI LUMBAR SPINE WITHOUT CONTRAST TECHNIQUE: Multiplanar, multisequence MR imaging of the lumbar spine was performed. No intravenous contrast was administered. COMPARISON:  CT abdomen and pelvis 03/04/2016. FINDINGS: Segmentation:  Unremarkable. Alignment: Trace anterolisthesis L3 on L4 is identified. Otherwise unremarkable. Vertebrae: The patient has a subacute fracture of L1 with vertebral body height loss of up to 70% centrally. Marrow edema is seen within the vertebral body. The fracture is identified on the prior CT. No new fracture. Scattered degenerative endplate signal change is seen. Conus medullaris: Extends to the L2 level and appears normal. Paraspinal and other soft tissues: Unremarkable. Disc levels: T11-12 is imaged in the sagittal plane only and negative. T12-L1: There is some bony retropulsion off the superior endplate of L1 but the central canal and foramina are open. L1-2:  Mild ligamentum flavum thickening.  Otherwise negative. L2-3: Ligamentum flavum thickening and a minimal disc bulge are seen. There is facet degenerative disease. The central canal and foramina are open. L3-4: Shallow disc bulge with a superimposed small central protrusion. There is some ligamentum flavum thickening. The central canal and foramina are open. L4-5: Left laminotomy defect is identified. There is a shallow disc bulge and superimposed left lateral recess protrusion. Disc contacts the left L5 root in lateral recess without compression or displacement. The thecal sac is widely patent. Mild right foraminal narrowing is noted. The left foramen is open. L5-S1: Small right lateral recess and foraminal protrusion without stenosis. IMPRESSION: Superior endplate compression fracture of L1 with up to 70% vertebral body height loss is subacute. The fracture is present on the prior CT scan from 03/04/2016. There is no acute fracture. Status post left laminotomy at L4-5. Disc  contacts the descending left L5 root in the lateral recess without compression or displacement. Mild right foraminal narrowing is seen at this level. Please see above for description of other levels. Electronically Signed   By: Drusilla Kanner M.D.   On: 03/10/2016 15:50   Ct Abdomen Pelvis W Contrast  Result Date: 03/04/2016 CLINICAL DATA:  82 year old female with abdominal pain and distention and severe constipation. EXAM: CT ABDOMEN AND PELVIS WITH CONTRAST TECHNIQUE: Multidetector CT imaging  of the abdomen and pelvis was performed using the standard protocol following bolus administration of intravenous contrast. CONTRAST:  80mL ISOVUE-300 IOPAMIDOL (ISOVUE-300) INJECTION 61% COMPARISON:  03/04/2016 FINDINGS: Lower chest: Mild cardiomegaly noted. A moderate hiatal hernia is noted. There is filling defect within this hernia which could represent food but difficult to exclude a mass. Very small bilateral pleural effusions and mild bibasilar atelectasis noted. Hepatobiliary: The liver and gallbladder are unremarkable. There is no evidence of biliary dilatation. Pancreas: Atrophic without other significant abnormality. Spleen: Unremarkable Adrenals/Urinary Tract: Bilateral renal atrophy identified. There is no evidence of hydronephrosis. The adrenal glands and bladder are unremarkable. Stomach/Bowel: A very large amount of stool is noted within the cecum, ascending colon, transverse colon and proximal descending colon. Gas and fluid within the cecum is also identified which measures up to 9.5 cm in greatest diameter. There is a relative transition point in the mid descending colon without definite colonic abnormality. No wall thickening is identified. There is no evidence of small bowel dilatation. Vascular/Lymphatic: Aortic atherosclerotic calcifications noted without aneurysm. No enlarged lymph nodes are identified. Reproductive: The patient is status post hysterectomy. No adnexal masses identified. Other: No  ascites, pneumoperitoneum or abscess. Musculoskeletal: Degenerative changes within the lumbar spine identified. 25% superior endplate compression fracture of L1 is age indeterminate. Mild bony retropulsion noted. IMPRESSION: Distended cecum, ascending colon, transverse colon and proximal descending colon filled with large amount of stool. Relative transition point in the mid descending colon without definite colonic abnormality. Consider direct inspection. No evidence of bowel wall thickening, small bowel obstruction or pneumoperitoneum. Moderate hiatal hernia with filling defect, which could represent feet but a mass is not entirely excluded. Consider direct inspection as indicated. 25% superior endplate compression fracture of L1- indeterminate. Very small bilateral pleural effusions with bibasilar atelectasis. Cardiomegaly. Electronically Signed   By: Harmon Pier M.D.   On: 03/04/2016 16:36  Dg Abd 2 Views  Result Date: 03/07/2016 CLINICAL DATA:  Abdominal discomfort EXAM: ABDOMEN - 2 VIEW COMPARISON:  03/06/2016 FINDINGS: Nasogastric catheter is again seen and stable. Scattered large and small bowel gas is noted. Persistent dilatation is noted in the colon on the right although the degree of dilatation has improved significantly now 10.7 cm in the right mid abdomen. No free air is seen. No bony abnormality is noted. IMPRESSION: Mild persistent right colonic dilatation although significantly improved from the prior exam. Electronically Signed   By: Alcide Clever M.D.   On: 03/07/2016 08:02   Dg Abd 2 Views  Result Date: 03/06/2016 CLINICAL DATA:  82 year old female with obstipation. Subsequent encounter. EXAM: ABDOMEN - 2 VIEW COMPARISON:  03/05/2016 plain film exam.  03/04/2016 FINDINGS: Nasogastric tube is in place. Tip projects over the left upper quadrant. Side hole just below the diaphragm level. Patient has a hiatal hernia as noted on recent CT. Gas distended colon. Dilated cecum difficult adequately  measure as there may be a superior medial displacement of the cecum. Using similar landmarks to recent plain film examination. Dilated proximal right colon/cecum now spanning over 19.2 cm versus prior 14.3 cm. Progressive distention of gas distended transverse colon measuring up to 8.2 cm versus prior 6.7 cm. Cannot exclude obstructing lesion.  Please see recent CT report. No free intraperitoneal air noted on decubitus view. IMPRESSION: Progressive distension of the right colon/cecum and transverse colon as noted above. Electronically Signed   By: Lacy Duverney M.D.   On: 03/06/2016 08:00  Dg Abd 2 Views  Result Date: 03/05/2016 CLINICAL DATA:  Obstipation EXAM: ABDOMEN - 2 VIEW COMPARISON:  CT 03/04/2016 FINDINGS: Diffuse gaseous distention of the colon, most pronounced involving the cecum. Cecum measures approximately 14 cm in diameter. When the cecal diameter was measured on the scout image on prior study, this measured 13 cm. No free air organomegaly. IMPRESSION: Marked gaseous distention of the colon, particularly cecum. This is similar to prior CT. Electronically Signed   By: Charlett Nose M.D.   On: 03/05/2016 09:05  Dg Abd 2 Views  Result Date: 03/04/2016 CLINICAL DATA:  Leukocytosis and constipation EXAM: ABDOMEN - 2 VIEW COMPARISON:  None. FINDINGS: No free air, portal venous gas, or pneumatosis. Small effusions in the lung bases. Markedly dilated colon primarily in the right lower abdomen with air-fluid levels on the upright view. The most dilated loop measures up to 11.7 cm increasing the risk of perforation. The more distal loops of colon are relatively decompressed but air does extend into the rectum. No pneumatosis. No other acute abnormalities. IMPRESSION: There is a markedly dilated loop of colon in the right side of the abdomen. A sigmoid volvulus is not excluded. Recommend a CT scan for further evaluation. Findings being called to Dr. Maisie Fus. Electronically Signed   By: Gerome Sam III  M.D   On: 03/04/2016 11:39  Dg Basil Dess Tube Plc W/fl W/rad  Result Date: 03/05/2016 CLINICAL DATA:  Colonic distention. Request for nasogastric tube placement. Difficult to place a nasogastric tube on the floor. EXAM: NASO G TUBE PLACEMENT WITH FL AND WITH RAD CONTRAST:  28 mL of Gastrografin injected through nasogastric tube FLUOROSCOPY TIME:  If the device does not provide the exposure index: Fluoroscopy Time (in minutes and seconds):  1 minutes and 6 seconds Number of Acquired Images:  2 COMPARISON:  Abdominal CT 03/04/2016 FINDINGS: Both nostrils anesthetized with viscous lidocaine. 14 French nasogastric tube would not easily advance through the right nostril. The tube easily advanced through the left nostril. Initially, the tube appeared to advance into the airway. The tube was pulled back and directed into the esophagus. Tube was advanced into the upper abdomen. Contrast injection confirmed placement in the stomach. IMPRESSION: Successful placement of a nasogastric tube with fluoroscopy. Catheter tip in the stomach. Electronically Signed   By: Richarda Overlie M.D.   On: 03/05/2016 14:36  Dg Colon W/water Sol Cm  Result Date: 03/08/2016 CLINICAL DATA:  Colonic obstruction. EXAM: WATER SOLUBLE CONTRAST ENEMA TECHNIQUE: Enema was performed using water-soluble contrast. FLUOROSCOPY TIME:  Radiation Exposure Index (as provided by the fluoroscopic device): 2 minutes If the device does not provide the exposure index: Fluoroscopy Time:  dictate in minutes and seconds Number of Acquired Images:  6 COMPARISON:  CT 03/04/2016.  Plain films 03/07/2016. FINDINGS: There is a slow gradual caliber change of the colon from the sigmoid colon into the descending colon. Mild distention of the transverse colon PICC. Cecum is moderately distended with gas. There is no colonic obstruction noted. Scattered sigmoid diverticula. IMPRESSION: No evidence of colonic obstruction. Scattered sigmoid diverticulosis. Electronically Signed    By: Charlett Nose M.D.   On: 03/08/2016 15:33    Lab Data:  CBC:  Recent Labs Lab 03/11/16 0521 03/12/16 0627 03/13/16 0534 03/14/16 0658  WBC 8.6 10.1 11.1* 8.8  HGB 11.7* 11.0* 10.9* 10.5*  HCT 34.3* 32.3* 32.5* 32.0*  MCV 86.0 84.8 87.1 88.6  PLT 407* 314 400 409*   Basic Metabolic Panel:  Recent Labs Lab 03/11/16 0521 03/12/16 0627 03/13/16 0534  NA 132* 130* 133*  K 2.8* 2.8* 4.8  CL 89* 88* 97*  CO2 30 30 28   GLUCOSE 91 101* 95  BUN 8 21* 19  CREATININE 0.72 1.22* 0.98  CALCIUM 10.5* 10.2 10.4*   GFR: Estimated Creatinine Clearance: 30.8 mL/min (by C-G formula based on SCr of 0.98 mg/dL). Liver Function Tests: No results for input(s): AST, ALT, ALKPHOS, BILITOT, PROT, ALBUMIN in the last 168 hours. No results for input(s): LIPASE, AMYLASE in the last 168 hours. No results for input(s): AMMONIA in the last 168 hours. Coagulation Profile:  Recent Labs Lab 03/12/16 0627  INR 1.16   Cardiac Enzymes: No results for input(s): CKTOTAL, CKMB, CKMBINDEX, TROPONINI in the last 168 hours. BNP (last 3 results) No results for input(s): PROBNP in the last 8760 hours. HbA1C: No results for input(s): HGBA1C in the last 72 hours. CBG: No results for input(s): GLUCAP in the last 168 hours. Lipid Profile: No results for input(s): CHOL, HDL, LDLCALC, TRIG, CHOLHDL, LDLDIRECT in the last 72 hours. Thyroid Function Tests: No results for input(s): TSH, T4TOTAL, FREET4, T3FREE, THYROIDAB in the last 72 hours. Anemia Panel: No results for input(s): VITAMINB12, FOLATE, FERRITIN, TIBC, IRON, RETICCTPCT in the last 72 hours. Urine analysis:    Component Value Date/Time   COLORURINE AMBER (A) 03/04/2016 0050   APPEARANCEUR CLOUDY (A) 03/04/2016 0050   LABSPEC 1.022 03/04/2016 0050   PHURINE 6.0 03/04/2016 0050   GLUCOSEU NEGATIVE 03/04/2016 0050   HGBUR SMALL (A) 03/04/2016 0050   BILIRUBINUR SMALL (A) 03/04/2016 0050   KETONESUR 15 (A) 03/04/2016 0050   PROTEINUR 30  (A) 03/04/2016 0050   NITRITE NEGATIVE 03/04/2016 0050   LEUKOCYTESUR MODERATE (A) 03/04/2016 0050     Kiondre Grenz M.D. [facility] 03/15/2016, 1:34 PM  Pager: [phone]  After 7pm go to [email or web address] - password [email or web address]  Call night coverage person covering after 7pm

## 2016-03-15 NOTE — [place] FL2 (Signed)
[place] MEDICAID FL2 LEVEL OF CARE SCREENING TOOL     IDENTIFICATION  Patient Name: Tara Frederick Birthdate: January 11, 1924 Sex: female Admission Date (Current Location): 03/03/2016  [place]:   [place]   Facility and Address:  [facility], [address] [place], [place] [address]      Provider Number: [id number]  Attending Physician Name and Address:  Zannie Cove, MD  Relative Name and Phone Number:  Gavin Pound    Current Level of Care: Hospital Recommended Level of Care: Skilled Nursing Facility Prior Approval Number:    Date Approved/Denied:   PASRR Number:    Discharge Plan: SNF    Current Diagnoses: Patient Active Problem List   Diagnosis Date Noted  . Pressure ulcer 03/10/2016  . Benign essential HTN 03/04/2016  . GERD (gastroesophageal reflux disease) 03/04/2016  . Leukocytosis   . Obstipation 03/03/2016  . UTI (lower urinary tract infection) 03/03/2016    Orientation RESPIRATION BLADDER Height & Weight     Self, Time, Situation, Place  Normal Continent Weight: 58.9 kg (129 lb 13.6 oz) Height:   (157.5 cm)  BEHAVIORAL SYMPTOMS/MOOD NEUROLOGICAL BOWEL NUTRITION STATUS      Continent Diet (Please see DC Summary)  AMBULATORY STATUS COMMUNICATION OF NEEDS Skin   Limited Assist Verbally PU Stage and Appropriate Care, Surgical wounds (PU Stage I on buttocks; Closed incision on back)                       Personal Care Assistance Level of Assistance  Bathing, Feeding, Dressing Bathing Assistance: Limited assistance Feeding assistance: Independent Dressing Assistance: Limited assistance     Functional Limitations Info  Sight, Hearing Sight Info: Impaired Hearing Info: Impaired      SPECIAL CARE FACTORS FREQUENCY  PT (By licensed PT)     PT Frequency: 5x/week              Contractures      Additional Factors Info  Code Status, Allergies Code Status Info: Full Allergies Info: Toprol  Xl Metoprolol, Actonel Risedronate Sodium, Biaxin Clarithromycin, Ceftin Cefuroxime, Codeine, Diuretic Buchu-cornsilk-ch Grass-hydran, Evista Raloxifene, Fosamax Alendronate Sodium, Lortab Hydrocodone-acetaminophen, Macrobid Nitrofurantoin, Morphine And Related, Neosporin Neomycin-bacitracin Zn-polymyx, Penicillins, Prednisone, Tetracyclines & Related, Tramadol           Current Medications (03/15/2016):  This is the current hospital active medication list Current Facility-Administered Medications  Medication Dose Route Frequency Provider Last Rate Last Dose  . 0.9 %  sodium chloride infusion   Intravenous Continuous Ripudeep Jenna Luo, MD 75 mL/hr at 03/14/16 2131    . 0.9 %  sodium chloride infusion   Intravenous Continuous PRN Julieanne Cotton, MD 10 mL/hr at 03/14/16 1137 10 mL/hr at 03/14/16 1137  . acetaminophen (TYLENOL) tablet 650 mg  650 mg Oral Q6H PRN Vida Rigger, MD   650 mg at 03/10/16 1307  . amLODipine (NORVASC) tablet 5 mg  5 mg Oral Daily Hillary Bow, DO   5 mg at 03/14/16 1000  . bupivacaine (PF) (MARCAINE) 0.25 % injection   Infiltration PRN Julieanne Cotton, MD   20 mL at 03/14/16 1238  . cyclobenzaprine (FLEXERIL) tablet 5 mg  5 mg Oral TID Ripudeep Jenna Luo, MD   5 mg at 03/14/16 2118  . fentaNYL (SUBLIMAZE) injection   Intravenous PRN Julieanne Cotton, MD   25 mcg at 03/14/16 1247  . heparin injection 5,000 Units  5,000 Units Subcutaneous Q8H Ralene Muskrat, PA-C   5,000  Units at 03/15/16 0527  . HYDROmorphone (DILAUDID) injection 0.5 mg  0.5 mg Intravenous Q4H PRN Ripudeep Jenna LuoK Rai, MD   0.5 mg at 03/15/16 0331  . HYDROmorphone (DILAUDID) injection   Intravenous PRN Julieanne CottonSanjeev Deveshwar, MD   1 mg at 03/14/16 1244  . latanoprost (XALATAN) 0.005 % ophthalmic solution 1 drop  1 drop Left Eye QHS Jenita SeashoreKai N Kang, RPH   1 drop at 03/14/16 2118  . lidocaine (LIDODERM) 5 % 1 patch  1 patch Transdermal Q24H Ripudeep Jenna LuoK Rai, MD   1 patch at 03/14/16 [phone]  . midazolam (VERSED) injection    Intravenous PRN Julieanne CottonSanjeev Deveshwar, MD   1 mg at 03/14/16 1247  . ondansetron (ZOFRAN) injection 4 mg  4 mg Intravenous Q6H PRN Hillary BowJared M Gardner, DO   4 mg at 03/15/16 0135  . pantoprazole (PROTONIX) EC tablet 40 mg  40 mg Oral Q0600 Ripudeep Jenna LuoK Rai, MD   40 mg at 03/15/16 0527  . phenol (CHLORASEPTIC) mouth spray 1 spray  1 spray Mouth/Throat PRN Adam PhenixElizabeth S Simaan, PA-C   1 spray at 03/06/16 1712  . polyethylene glycol (MIRALAX / GLYCOLAX) packet 17 g  17 g Oral TID Ripudeep Jenna LuoK Rai, MD   17 g at 03/14/16 2118  . prochlorperazine (COMPAZINE) injection 10 mg  10 mg Intravenous Q6H PRN Rodolph Bonganiel V Thompson, MD   10 mg at 03/04/16 1236  . senna-docusate (Senokot-S) tablet 1 tablet  1 tablet Oral BID Ripudeep Jenna LuoK Rai, MD   1 tablet at 03/14/16 2118  . tobramycin (NEBCIN) powder   Topical PRN Julieanne CottonSanjeev Deveshwar, MD   1.2 g at 03/14/16 1238     Discharge Medications: Please see discharge summary for a list of discharge medications.  Relevant Imaging Results:  Relevant Lab Results:   Additional Information SSN: [id number]  [address] [place], [place]

## 2016-03-15 NOTE — Progress Notes (Signed)
Physical Therapy Treatment Patient Details Name: Tara Frederick MRN: [id number] DOB: 1923-12-01 Today's Date: 03/15/2016    History of Present Illness 82 y.o. female with medical history significant of HTN, GERD, degenerative disk disease.  Patient given narcotics in ED last week for back pain. [facility] Patient was admitted given oral MiraLAX and some enemas with no significant improvement. Abdominal films showed worsening cecal dilatation. GI was consulted. A G-tube placed on the fluoroscopy. Patient given every 2 hour MiraLAX and tap water enemas. Patient finally with multiple bowel movements. s/p Gastrografin enema 8/2.     PT Comments    Progressing slowly, pain continues, but not significantly limiting mobility.  Pt needs encouragement to mobilize.   Follow Up Recommendations  SNF     Equipment Recommendations  None recommended by PT    Recommendations for Other Services       Precautions / Restrictions Precautions Precautions: Fall Restrictions Weight Bearing Restrictions: No    Mobility  Bed Mobility Overal bed mobility: Needs Assistance Bed Mobility: Rolling;Sidelying to Sit;Sit to Sidelying Rolling: Min assist Sidelying to sit: Min assist   Sit to supine: Min assist Sit to sidelying: Mod assist General bed mobility comments: still with difficulty getting smoothly down to sidelying  Transfers Overall transfer level: Needs assistance Equipment used: Rolling walker (2 wheeled) Transfers: Sit to/from Stand Sit to Stand: Min guard         General transfer comment: guard for safety, cues for hand placement  Ambulation/Gait Ambulation/Gait assistance: Min guard Ambulation Distance (Feet): 190 Feet Assistive device: Rolling walker (2 wheeled) Gait Pattern/deviations: Step-through pattern Gait velocity: decreased Gait velocity interpretation: Below normal speed for age/gender General Gait Details: slow and guarded, but generally steady   Stairs            Wheelchair Mobility    Modified Rankin (Stroke Patients Only)       Balance Overall balance assessment: Needs assistance Sitting-balance support: No upper extremity supported Sitting balance-Leahy Scale: Good Sitting balance - Comments: close supervision   Standing balance support: Bilateral upper extremity supported Standing balance-Leahy Scale: Poor Standing balance comment: reliant on RW                     Cognition Arousal/Alertness: Awake/alert Behavior During Therapy: WFL for tasks assessed/performed Overall Cognitive Status: Within Functional Limits for tasks assessed                      Exercises      General Comments        Pertinent Vitals/Pain Pain Assessment: Faces Pain Score: 5  Faces Pain Scale: Hurts even more Pain Location: back Pain Descriptors / Indicators: Grimacing;Moaning Pain Intervention(s): Limited activity within patient's tolerance;Repositioned    Home Living                      Prior Function            PT Goals (current goals can now be found in the care plan section) Acute Rehab PT Goals Patient Stated Goal: to return to PLOF PT Goal Formulation: With patient Time For Goal Achievement: 03/23/16 Potential to Achieve Goals: Good Progress towards PT goals: Progressing toward goals    Frequency  Min 3X/week    PT Plan Current plan remains appropriate    Co-evaluation             End of Session   Activity Tolerance: Patient tolerated treatment well Patient  left: in bed;with call bell/phone within reach;with bed alarm set;with family/visitor present     Time: 1640-1700 PT Time Calculation (min) (ACUTE ONLY): 20 min  Charges:  $Gait Training: 8-22 mins                    G Codes:      Mattie Novosel, Eliseo Gum 03/15/2016, 6:07 PM 03/15/2016  [place] Bing, PT [phone]  (pager)

## 2016-03-15 NOTE — Care Management Important Message (Signed)
Important Message  Patient Details  Name: Tara Frederick MRN: [id number] Date of Birth: May 02, 1924   Medicare Important Message Given:  Yes    Bernadette HoitShoffner, Nyrie Sigal Coleman 03/15/2016, 3:08 PM

## 2016-03-15 NOTE — Care Management Note (Addendum)
Case Management Note  Patient Details  Name: Tara GermanFrances Frederick MRN: [id number] Date of Birth: [birth year]  Subjective/Objective:                    Action/Plan: Plan is to d/c to SNF/Rehab. today.  Expected Discharge Date:   03/15/2016     Expected Discharge Plan:  SNF  In-House Referral:     Discharge planning Services  CM Consult   Status of Service:  Completed, signed off  If discussed at Long Length of Stay Meetings, dates discussed:    Additional Comments:  Epifanio LeschesCole, Catalena Stanhope Hudson, RN 03/15/2016, 10:32 AM

## 2016-03-15 NOTE — Progress Notes (Signed)
Referring Physician(s): Dr Zannie Cove  Supervising Physician: Julieanne Cotton  Patient Status:  Inpatient  Chief Complaint:  Procedures:  IR VERTEBROPLASTY LUMBAR BX INC UNI/BIL INC/INJECT/IMAGING [[id number] (Custom)]      S/P L1 VP       Subjective:  Up in chair Pt has no real relief from pain at this point Pain is located left low back Reminded pt 2 week maximum benefit Hopefully will get pain relief gradually  Allergies: Toprol xl [metoprolol]; Actonel [risedronate sodium]; Biaxin [clarithromycin]; Ceftin [cefuroxime]; Codeine; Diuretic [buchu-cornsilk-ch grass-hydran]; Evista [raloxifene]; Fosamax [alendronate sodium]; Lortab [hydrocodone-acetaminophen]; Macrobid [nitrofurantoin]; Morphine and related; Neosporin [neomycin-bacitracin zn-polymyx]; Penicillins; Prednisone; Tetracyclines & related; and Tramadol  Medications: Prior to Admission medications   Medication Sig Start Date End Date Taking? Authorizing Provider  albuterol (PROVENTIL HFA;VENTOLIN HFA) 108 (90 Base) MCG/ACT inhaler Inhale 1 puff into the lungs every 6 (six) hours as needed for wheezing or shortness of breath.   Yes Historical Provider, MD  amLODipine (NORVASC) 5 MG tablet Take 5 mg by mouth daily.   Yes Historical Provider, MD  carboxymethylcellulose (REFRESH PLUS) 0.5 % SOLN Place 1 drop into both eyes 3 (three) times daily as needed.   Yes Historical Provider, MD  denosumab (PROLIA) 60 MG/ML SOLN injection Inject 60 mg into the skin every 6 (six) months. Administer in upper arm, thigh, or abdomen   Yes Historical Provider, MD  ergocalciferol (VITAMIN D2) 50000 units capsule Take 50,000 Units by mouth every 30 (thirty) days.   Yes Historical Provider, MD  erythromycin ophthalmic ointment Place 1 application into both eyes at bedtime.   Yes Historical Provider, MD  fexofenadine (ALLEGRA) 180 MG tablet Take 180 mg by mouth daily.   Yes Historical Provider, MD  latanoprost (XALATAN) 0.005 %  ophthalmic solution Place 1 drop into the left eye at bedtime.   Yes Historical Provider, MD  Multiple Vitamins-Minerals (MULTIVITAMIN WITH MINERALS) tablet Take 1 tablet by mouth daily.   Yes Historical Provider, MD  pantoprazole (PROTONIX) 40 MG tablet Take 40 mg by mouth 2 (two) times daily.   Yes Historical Provider, MD     Vital Signs: BP (!) 155/74 (BP Location: Left Arm)   Pulse (!) 115   Temp 97.9 F (36.6 C) (Oral)   Resp 16   Ht  (1.575 m)   Wt 129 lb 13.6 oz (58.9 kg)   SpO2 94%   Physical Exam  Musculoskeletal: Normal range of motion. She exhibits tenderness.  Tender to left of site No hematoma  Neurological: She is alert.  Skin: Skin is warm and dry.  Site clean dry No bleeding Moving all 4s  Nursing note and vitals reviewed.   Imaging: No results found.  Labs:  CBC:  Recent Labs  03/11/16 0521 03/12/16 0627 03/13/16 0534 03/14/16 0658  WBC 8.6 10.1 11.1* 8.8  HGB 11.7* 11.0* 10.9* 10.5*  HCT 34.3* 32.3* 32.5* 32.0*  PLT 407* 314 400 409*    COAGS:  Recent Labs  03/12/16 0627  INR 1.16  APTT 41*    BMP:  Recent Labs  03/08/16 0909 03/11/16 0521 03/12/16 0627 03/13/16 0534  NA 131* 132* 130* 133*  K 3.5 2.8* 2.8* 4.8  CL 96* 89* 88* 97*  CO2 GLUCOSE 98 91 101* 95  BUN 8 8 21* 19  CALCIUM 9.8 10.5* 10.2 10.4*  CREATININE 0.74 0.72 1.22* 0.98  GFRNONAA >60 >60 38* 50*  GFRAA >60 >60 44* 58*  LIVER FUNCTION TESTS: No results for input(s): BILITOT, AST, ALT, ALKPHOS, PROT, ALBUMIN in the last 8760 hours.  Assessment and Plan:  Lumbar 1 Vertebroplasty 8/8 No real relief as of yet Gradual relief is likely---call if need us Will place order for 2 week follow up with Dr Corliss Skainseveshwar  Electronically Signed: Ralene MuskratURPIN,Celestino Ackerman A 03/15/2016, 9:37 AM   I spent a total of 15 Minutes at the the patient's bedside AND on the patient's hospital floor or unit, greater than 50% of which was counseling/coordinating care for  L1 VP

## 2016-03-16 MED ORDER — LIDOCAINE 5 % EX PTCH: 1.0000 | MEDICATED_PATCH | CUTANEOUS | 0 refills | Status: DC

## 2016-03-16 MED ORDER — SENNOSIDES-DOCUSATE SODIUM 8.6-50 MG PO TABS: 2.0000 | ORAL_TABLET | Freq: Two times a day (BID) | ORAL | Status: DC

## 2016-03-16 MED ORDER — HYDROCODONE-ACETAMINOPHEN 5-325 MG PO TABS: 1.0000 | ORAL_TABLET | Freq: Four times a day (QID) | ORAL | 0 refills | Status: DC | PRN

## 2016-03-16 MED ORDER — POLYETHYLENE GLYCOL 3350 17 G PO PACK: 17.0000 g | PACK | Freq: Three times a day (TID) | ORAL | 0 refills | Status: AC

## 2016-03-16 MED ORDER — PANTOPRAZOLE SODIUM 40 MG PO TBEC: 40.0000 mg | DELAYED_RELEASE_TABLET | Freq: Every day | ORAL | Status: AC

## 2016-03-16 NOTE — Progress Notes (Signed)
Report given to Lalla BrothersJim Morris,[facility]urse at [facility]

## 2016-03-16 NOTE — Progress Notes (Signed)
Referring Physician(s): Dr Zannie Cove  Supervising Physician: Julieanne Cotton  Patient Status:  Inpatient  Chief Complaint:  L1 Vertebroplasty 8/8  Subjective:  Still with little relief of pain Says pain is sharp and travels from low back to left ranks 3-4 on scale of 10 For discharge today per pt 2 week follow up with Dr Corliss Skains   Allergies: Toprol xl [metoprolol]; Actonel [risedronate sodium]; Biaxin [clarithromycin]; Ceftin [cefuroxime]; Codeine; Diuretic [buchu-cornsilk-ch grass-hydran]; Evista [raloxifene]; Fosamax [alendronate sodium]; Lortab [hydrocodone-acetaminophen]; Macrobid [nitrofurantoin]; Morphine and related; Neosporin [neomycin-bacitracin zn-polymyx]; Penicillins; Prednisone; Tetracyclines & related; and Tramadol  Medications: Prior to Admission medications   Medication Sig Start Date End Date Taking? Authorizing Provider  albuterol (PROVENTIL HFA;VENTOLIN HFA) 108 (90 Base) MCG/ACT inhaler Inhale 1 puff into the lungs every 6 (six) hours as needed for wheezing or shortness of breath.   Yes Historical Provider, MD  amLODipine (NORVASC) 5 MG tablet Take 5 mg by mouth daily.   Yes Historical Provider, MD  carboxymethylcellulose (REFRESH PLUS) 0.5 % SOLN Place 1 drop into both eyes 3 (three) times daily as needed.   Yes Historical Provider, MD  denosumab (PROLIA) 60 MG/ML SOLN injection Inject 60 mg into the skin every 6 (six) months. Administer in upper arm, thigh, or abdomen   Yes Historical Provider, MD  ergocalciferol (VITAMIN D2) 50000 units capsule Take 50,000 Units by mouth every 30 (thirty) days.   Yes Historical Provider, MD  erythromycin ophthalmic ointment Place 1 application into both eyes at bedtime.   Yes Historical Provider, MD  fexofenadine (ALLEGRA) 180 MG tablet Take 180 mg by mouth daily.   Yes Historical Provider, MD  latanoprost (XALATAN) 0.005 % ophthalmic solution Place 1 drop into the left eye at bedtime.   Yes Historical Provider,  MD  Multiple Vitamins-Minerals (MULTIVITAMIN WITH MINERALS) tablet Take 1 tablet by mouth daily.   Yes Historical Provider, MD  HYDROcodone-acetaminophen (NORCO/VICODIN) 5-325 MG tablet Take 1 tablet by mouth every 6 (six) hours as needed for moderate pain. 03/16/16   Zannie Cove, MD  lidocaine (LIDODERM) 5 % Place 1 patch onto the skin daily. Remove & Discard patch within 12 hours or as directed by MD 03/16/16   Zannie Cove, MD  pantoprazole (PROTONIX) 40 MG tablet Take 1 tablet (40 mg total) by mouth daily. 03/16/16   Zannie Cove, MD  polyethylene glycol (MIRALAX / GLYCOLAX) packet Take 17 g by mouth 3 (three) times daily. 03/16/16   Zannie Cove, MD  senna-docusate (SENOKOT-S) 8.6-50 MG tablet Take 2 tablets by mouth 2 (two) times daily. 03/16/16   Zannie Cove, MD     Vital Signs: BP 130/65 (BP Location: Right Arm)   Pulse 98   Temp 98.3 F (36.8 C) (Oral)   Resp 16   Ht  (1.575 m)   Wt 129 lb 13.6 oz (58.9 kg)   SpO2 94%   Physical Exam  Constitutional: She is oriented to person, place, and time.  Musculoskeletal: Normal range of motion.  Says pain is mostly to left back  Neurological: She is alert and oriented to person, place, and time.  Skin: Skin is warm and dry.  Site is clean and dry NT at site Tender to left of site of VP No hematoma  Psychiatric: She has a normal mood and affect. Her behavior is normal. Judgment and thought content normal.  Nursing note and vitals reviewed.   Imaging: No results found.  Labs:  CBC:  Recent Labs  03/11/16 0521 03/12/16 6578  03/13/16 0534 03/14/16 0658  WBC 8.6 10.1 11.1* 8.8  HGB 11.7* 11.0* 10.9* 10.5*  HCT 34.3* 32.3* 32.5* 32.0*  PLT 407* 314 400 409*    COAGS:  Recent Labs  03/12/16 0627  INR 1.16  APTT 41*    BMP:  Recent Labs  03/08/16 0909 03/11/16 0521 03/12/16 0627 03/13/16 0534  NA 131* 132* 130* 133*  K 3.5 2.8* 2.8* 4.8  CL 96* 89* 88* 97*  CO2 23 30 30 28   GLUCOSE 98 91  101* 95  BUN 8 8 21* 19  CALCIUM 9.8 10.5* 10.2 10.4*  CREATININE 0.74 0.72 1.22* 0.98  GFRNONAA >60 >60 38* 50*  GFRAA >60 >60 44* 58*    LIVER FUNCTION TESTS: No results for input(s): BILITOT, AST, ALT, ALKPHOS, PROT, ALBUMIN in the last 8760 hours.  Assessment and Plan:  L1 VP 8/8 Still with minimal to no pain relief after procedure She will hear from scheduler for 2 week follow up with Dr Corliss Skainseveshwar   Electronically Signed: Ralene MuskratURPIN,Shivansh Hardaway A 03/16/2016, 11:16 AM   I spent a total of 15 Minutes at the the patient's bedside AND on the patient's hospital floor or unit, greater than 50% of which was counseling/coordinating care for L1 VP

## 2016-03-16 NOTE — Discharge Summary (Signed)
Physician Discharge Summary  Tara Frederick ZOX:[id number] DOB: Dec 10, 1923 DOA: 03/03/2016  PCP: No primary care provider on file.  Admit date: 03/03/2016 Discharge date: 03/16/2016  Time spent: 45 minutes  Recommendations for Outpatient Follow-up:  1. Dr.Deveshwar, Neuro IR in 2 weeks, office will call 2. PCP Dr.Caplan in 1week   Discharge Diagnoses:  Principal Problem:   Obstipation   Low back pain   L1 compression fracture   UTI (lower urinary tract infection)   Benign essential HTN   GERD (gastroesophageal reflux disease)   Leukocytosis   Pressure ulcer   Discharge Condition: stable  Diet recommendation:heart healthy  Filed Weights   03/03/16 2048  Weight: 58.9 kg (129 lb 13.6 oz)    History of present illness:  Tara Frederick a 82 y.o.femalewith medical history significant of HTN, GERD, degenerative disk disease. Patient given narcotics in ED last week for back pain. Since then she has had severe constipation. Took narcotic last on Sunday. No BM since then. Presented to [facility] days ago. Was admitted. CT scan at that time showed severe constipation  Hospital Course:  Severe obstipation -Presented with worsening abdominal distention and inability to have bowel movements despitemultiple laxatives and multiple Fleet enemas as well as an attempt at manual disimpaction prior to admission. -  CT abdomen and pelvis done 03/04/2016 with a distended cecum measuring 9.5 cm, ascending colon transverse colon and proximal descending colon filled with large amount of stool. 25% superior endplate compression fracture of L1 indeterminate.  - she was seen by GI this admission, Gastrografin enema on 8/2 negative for any obstructing lesions - NG tube placed under fluoroscopy per radiology 03/05/2016.  - Subsequently, patient had multiple BMs on 7/30-8/1 while on every 2 hours MiraLAX with improvement with abdominal distention and abdominal pain. Abdominal x-rays with improvement.  FInally NG was clamped and removed, now Patient is tolerating solid diet and having BMs on current bowel regimen with MiraLAX TID, Senokot-S and Dulcolax, will need these continued post discharge  Severe back pain/L1 compression Fx Patient complaining of low back pain, stated that this is what led to her original complaint of severe constipation when patient was started on pain medications - Discontinued Toradol and naproxen due to renal insufficiency -MRI L spine showed subacute compression fracture of L1, Dr.Rai d/w patient's daughter, agreed for kyphoplasty.  - s/p kyphoplasty 8/8, still in moderate pain which is controlled with narcotics now, IR updated pt that might take 2 weeks for max benefit from procedure, will need aggressive PT as well, plan for Rehab at SNF. -now on Lidocaine patch, vicodin PRN and tylenol for pain  probable UTI Urine cultures with multiple morphotypes, Completed Levaquin  leukocytosis Likely secondary to problem #2andreactive leukocytosis. - Currently no leukocytosis, urine cultures with multiple morphotypes, Completed IV Levaquin   GERD Continue PPI   hypertension Continue Norvasc.  Procedures: Kyphoplasty 03/14/16  Consultations:  Gi  IR, Dr.Deveshwar  Discharge Exam: Vitals:   03/15/16 2105 03/16/16 1100  BP: (!) 133/55 130/65  Pulse: (!) 110 98  Resp: 18 16  Temp: 99.2 F (37.3 C) 98.3 F (36.8 C)    General:AAIx3 Cardiovascular: S12s/RRR Respiratory: CTAB  Discharge Instructions   Discharge Instructions    Diet - low sodium heart healthy    Complete by:  As directed   Increase activity slowly    Complete by:  As directed     Current Discharge Medication List    START taking these medications   Details  HYDROcodone-acetaminophen (NORCO/VICODIN) 5-325  MG tablet Take 1 tablet by mouth every 6 (six) hours as needed for moderate pain. Qty: 30 tablet, Refills: 0    lidocaine (LIDODERM) 5 % Place 1 patch onto the skin  daily. Remove & Discard patch within 12 hours or as directed by MD Qty: 30 patch, Refills: 0    polyethylene glycol (MIRALAX / GLYCOLAX) packet Take 17 g by mouth 3 (three) times daily. Refills: 0    senna-docusate (SENOKOT-S) 8.6-50 MG tablet Take 2 tablets by mouth 2 (two) times daily.      CONTINUE these medications which have CHANGED   Details  pantoprazole (PROTONIX) 40 MG tablet Take 1 tablet (40 mg total) by mouth daily.      CONTINUE these medications which have NOT CHANGED   Details  albuterol (PROVENTIL HFA;VENTOLIN HFA) 108 (90 Base) MCG/ACT inhaler Inhale 1 puff into the lungs every 6 (six) hours as needed for wheezing or shortness of breath.    amLODipine (NORVASC) 5 MG tablet Take 5 mg by mouth daily.    carboxymethylcellulose (REFRESH PLUS) 0.5 % SOLN Place 1 drop into both eyes 3 (three) times daily as needed.    denosumab (PROLIA) 60 MG/ML SOLN injection Inject 60 mg into the skin every 6 (six) months. Administer in upper arm, thigh, or abdomen    ergocalciferol (VITAMIN D2) 50000 units capsule Take 50,000 Units by mouth every 30 (thirty) days.    erythromycin ophthalmic ointment Place 1 application into both eyes at bedtime.    fexofenadine (ALLEGRA) 180 MG tablet Take 180 mg by mouth daily.    latanoprost (XALATAN) 0.005 % ophthalmic solution Place 1 drop into the left eye at bedtime.    Multiple Vitamins-Minerals (MULTIVITAMIN WITH MINERALS) tablet Take 1 tablet by mouth daily.       Allergies  Allergen Reactions  . Toprol Xl [Metoprolol] Shortness Of Breath  . Actonel [Risedronate Sodium]     unk  . Biaxin [Clarithromycin] Nausea And Vomiting  . Ceftin [Cefuroxime]     unk  . Codeine Nausea Only  . Diuretic [Buchu-Cornsilk-Ch Grass-Hydran]     unknown  . Evista [Raloxifene]     unknown  . Fosamax [Alendronate Sodium]     uknown  . Lortab [Hydrocodone-Acetaminophen]     "activie"  . Macrobid [Nitrofurantoin]     unknown  . Morphine And  Related Nausea Only  . Neosporin [Neomycin-Bacitracin Zn-Polymyx]     unk  . Penicillins     Unknown  . Prednisone Nausea And Vomiting  . Tetracyclines & Related     unknown  . Tramadol Nausea Only   Follow-up Information    DEVESHWAR, Grandville Silos, MD .   Specialty:  Interventional Radiology Why:  pt will hear from scheduler for 2 week follow up with Dr Corliss Skains; call [phone] sooner if needed Contact information: [address] [place] [address] [phone]        Romeo Rabon, MD. Schedule an appointment as soon as possible for a visit in 1 week(s).   Specialty:  Internal Medicine Contact information: [address] [place] [address] [phone]            The results of significant diagnostics from this hospitalization (including imaging, microbiology, ancillary and laboratory) are listed below for reference.    Significant Diagnostic Studies: Dg Chest 2 View  Result Date: 03/04/2016 CLINICAL DATA:  Leukocytosis. EXAM: CHEST  2 VIEW COMPARISON:  None. FINDINGS: Small bilateral effusions. The heart, hila, an mediastinum are  unremarkable. No pulmonary nodules or masses. No focal infiltrates. Mild pleural thickening laterally in the right chest, likely chronic. No other acute abnormalities. IMPRESSION: Small effusions and underlying atelectasis. Electronically Signed   By: Gerome Sam III M.D   On: 03/04/2016 11:29  Mr Lumbar Spine Wo Contrast  Result Date: 03/10/2016 CLINICAL DATA:  Low back and bilateral hip pain. Status post blow to the back on a commode yesterday. Initial encounter. EXAM: MRI LUMBAR SPINE WITHOUT CONTRAST TECHNIQUE: Multiplanar, multisequence MR imaging of the lumbar spine was performed. No intravenous contrast was administered. COMPARISON:  CT abdomen and pelvis 03/04/2016. FINDINGS: Segmentation:  Unremarkable. Alignment: Trace anterolisthesis L3 on L4 is identified. Otherwise unremarkable. Vertebrae: The patient  has a subacute fracture of L1 with vertebral body height loss of up to 70% centrally. Marrow edema is seen within the vertebral body. The fracture is identified on the prior CT. No new fracture. Scattered degenerative endplate signal change is seen. Conus medullaris: Extends to the L2 level and appears normal. Paraspinal and other soft tissues: Unremarkable. Disc levels: T11-12 is imaged in the sagittal plane only and negative. T12-L1: There is some bony retropulsion off the superior endplate of L1 but the central canal and foramina are open. L1-2:  Mild ligamentum flavum thickening.  Otherwise negative. L2-3: Ligamentum flavum thickening and a minimal disc bulge are seen. There is facet degenerative disease. The central canal and foramina are open. L3-4: Shallow disc bulge with a superimposed small central protrusion. There is some ligamentum flavum thickening. The central canal and foramina are open. L4-5: Left laminotomy defect is identified. There is a shallow disc bulge and superimposed left lateral recess protrusion. Disc contacts the left L5 root in lateral recess without compression or displacement. The thecal sac is widely patent. Mild right foraminal narrowing is noted. The left foramen is open. L5-S1: Small right lateral recess and foraminal protrusion without stenosis. IMPRESSION: Superior endplate compression fracture of L1 with up to 70% vertebral body height loss is subacute. The fracture is present on the prior CT scan from 03/04/2016. There is no acute fracture. Status post left laminotomy at L4-5. Disc contacts the descending left L5 root in the lateral recess without compression or displacement. Mild right foraminal narrowing is seen at this level. Please see above for description of other levels. Electronically Signed   By: Drusilla Kanner M.D.   On: 03/10/2016 15:50   Ct Abdomen Pelvis W Contrast  Result Date: 03/04/2016 CLINICAL DATA:  82 year old female with abdominal pain and distention  and severe constipation. EXAM: CT ABDOMEN AND PELVIS WITH CONTRAST TECHNIQUE: Multidetector CT imaging of the abdomen and pelvis was performed using the standard protocol following bolus administration of intravenous contrast. CONTRAST:  80mL ISOVUE-300 IOPAMIDOL (ISOVUE-300) INJECTION 61% COMPARISON:  03/04/2016 FINDINGS: Lower chest: Mild cardiomegaly noted. A moderate hiatal hernia is noted. There is filling defect within this hernia which could represent food but difficult to exclude a mass. Very small bilateral pleural effusions and mild bibasilar atelectasis noted. Hepatobiliary: The liver and gallbladder are unremarkable. There is no evidence of biliary dilatation. Pancreas: Atrophic without other significant abnormality. Spleen: Unremarkable Adrenals/Urinary Tract: Bilateral renal atrophy identified. There is no evidence of hydronephrosis. The adrenal glands and bladder are unremarkable. Stomach/Bowel: A very large amount of stool is noted within the cecum, ascending colon, transverse colon and proximal descending colon. Gas and fluid within the cecum is also identified which measures up to 9.5 cm in greatest diameter. There is a relative transition point in the  mid descending colon without definite colonic abnormality. No wall thickening is identified. There is no evidence of small bowel dilatation. Vascular/Lymphatic: Aortic atherosclerotic calcifications noted without aneurysm. No enlarged lymph nodes are identified. Reproductive: The patient is status post hysterectomy. No adnexal masses identified. Other: No ascites, pneumoperitoneum or abscess. Musculoskeletal: Degenerative changes within the lumbar spine identified. 25% superior endplate compression fracture of L1 is age indeterminate. Mild bony retropulsion noted. IMPRESSION: Distended cecum, ascending colon, transverse colon and proximal descending colon filled with large amount of stool. Relative transition point in the mid descending colon without  definite colonic abnormality. Consider direct inspection. No evidence of bowel wall thickening, small bowel obstruction or pneumoperitoneum. Moderate hiatal hernia with filling defect, which could represent feet but a mass is not entirely excluded. Consider direct inspection as indicated. 25% superior endplate compression fracture of L1- indeterminate. Very small bilateral pleural effusions with bibasilar atelectasis. Cardiomegaly. Electronically Signed   By: Harmon Pier M.D.   On: 03/04/2016 16:36  Dg Abd 2 Views  Result Date: 03/07/2016 CLINICAL DATA:  Abdominal discomfort EXAM: ABDOMEN - 2 VIEW COMPARISON:  03/06/2016 FINDINGS: Nasogastric catheter is again seen and stable. Scattered large and small bowel gas is noted. Persistent dilatation is noted in the colon on the right although the degree of dilatation has improved significantly now 10.7 cm in the right mid abdomen. No free air is seen. No bony abnormality is noted. IMPRESSION: Mild persistent right colonic dilatation although significantly improved from the prior exam. Electronically Signed   By: Alcide Clever M.D.   On: 03/07/2016 08:02   Dg Abd 2 Views  Result Date: 03/06/2016 CLINICAL DATA:  82 year old female with obstipation. Subsequent encounter. EXAM: ABDOMEN - 2 VIEW COMPARISON:  03/05/2016 plain film exam.  03/04/2016 FINDINGS: Nasogastric tube is in place. Tip projects over the left upper quadrant. Side hole just below the diaphragm level. Patient has a hiatal hernia as noted on recent CT. Gas distended colon. Dilated cecum difficult adequately measure as there may be a superior medial displacement of the cecum. Using similar landmarks to recent plain film examination. Dilated proximal right colon/cecum now spanning over 19.2 cm versus prior 14.3 cm. Progressive distention of gas distended transverse colon measuring up to 8.2 cm versus prior 6.7 cm. Cannot exclude obstructing lesion.  Please see recent CT report. No free intraperitoneal air  noted on decubitus view. IMPRESSION: Progressive distension of the right colon/cecum and transverse colon as noted above. Electronically Signed   By: Lacy Duverney M.D.   On: 03/06/2016 08:00  Dg Abd 2 Views  Result Date: 03/05/2016 CLINICAL DATA:  Obstipation EXAM: ABDOMEN - 2 VIEW COMPARISON:  CT 03/04/2016 FINDINGS: Diffuse gaseous distention of the colon, most pronounced involving the cecum. Cecum measures approximately 14 cm in diameter. When the cecal diameter was measured on the scout image on prior study, this measured 13 cm. No free air organomegaly. IMPRESSION: Marked gaseous distention of the colon, particularly cecum. This is similar to prior CT. Electronically Signed   By: Charlett Nose M.D.   On: 03/05/2016 09:05  Dg Abd 2 Views  Result Date: 03/04/2016 CLINICAL DATA:  Leukocytosis and constipation EXAM: ABDOMEN - 2 VIEW COMPARISON:  None. FINDINGS: No free air, portal venous gas, or pneumatosis. Small effusions in the lung bases. Markedly dilated colon primarily in the right lower abdomen with air-fluid levels on the upright view. The most dilated loop measures up to 11.7 cm increasing the risk of perforation. The more distal loops of colon are  relatively decompressed but air does extend into the rectum. No pneumatosis. No other acute abnormalities. IMPRESSION: There is a markedly dilated loop of colon in the right side of the abdomen. A sigmoid volvulus is not excluded. Recommend a CT scan for further evaluation. Findings being called to Dr. Maisie Fushomas. Electronically Signed   By: Gerome Samavid  Williams III M.D   On: 03/04/2016 11:39  Dg Basil DessNaso G Tube Plc W/fl W/rad  Result Date: 03/05/2016 CLINICAL DATA:  Colonic distention. Request for nasogastric tube placement. Difficult to place a nasogastric tube on the floor. EXAM: NASO G TUBE PLACEMENT WITH FL AND WITH RAD CONTRAST:  28 mL of Gastrografin injected through nasogastric tube FLUOROSCOPY TIME:  If the device does not provide the exposure index:  Fluoroscopy Time (in minutes and seconds):  1 minutes and 6 seconds Number of Acquired Images:  2 COMPARISON:  Abdominal CT 03/04/2016 FINDINGS: Both nostrils anesthetized with viscous lidocaine. 14 French nasogastric tube would not easily advance through the right nostril. The tube easily advanced through the left nostril. Initially, the tube appeared to advance into the airway. The tube was pulled back and directed into the esophagus. Tube was advanced into the upper abdomen. Contrast injection confirmed placement in the stomach. IMPRESSION: Successful placement of a nasogastric tube with fluoroscopy. Catheter tip in the stomach. Electronically Signed   By: Richarda OverlieAdam  Henn M.D.   On: 03/05/2016 14:36  Dg Colon W/water Sol Cm  Result Date: 03/08/2016 CLINICAL DATA:  Colonic obstruction. EXAM: WATER SOLUBLE CONTRAST ENEMA TECHNIQUE: Enema was performed using water-soluble contrast. FLUOROSCOPY TIME:  Radiation Exposure Index (as provided by the fluoroscopic device): 2 minutes If the device does not provide the exposure index: Fluoroscopy Time:  dictate in minutes and seconds Number of Acquired Images:  6 COMPARISON:  CT 03/04/2016.  Plain films 03/07/2016. FINDINGS: There is a slow gradual caliber change of the colon from the sigmoid colon into the descending colon. Mild distention of the transverse colon PICC. Cecum is moderately distended with gas. There is no colonic obstruction noted. Scattered sigmoid diverticula. IMPRESSION: No evidence of colonic obstruction. Scattered sigmoid diverticulosis. Electronically Signed   By: Charlett NoseKevin  Dover M.D.   On: 03/08/2016 15:33    Microbiology: No results found for this or any previous visit (from the past 240 hour(s)).   Labs: Basic Metabolic Panel:  Recent Labs Lab 03/11/16 0521 03/12/16 0627 03/13/16 0534  NA 132* 130* 133*  K 2.8* 2.8* 4.8  CL 89* 88* 97*  CO2 30 30 28   GLUCOSE 91 101* 95  BUN 8 21* 19  CREATININE 0.72 1.22* 0.98  CALCIUM 10.5* 10.2  10.4*   Liver Function Tests: No results for input(s): AST, ALT, ALKPHOS, BILITOT, PROT, ALBUMIN in the last 168 hours. No results for input(s): LIPASE, AMYLASE in the last 168 hours. No results for input(s): AMMONIA in the last 168 hours. CBC:  Recent Labs Lab 03/11/16 0521 03/12/16 0627 03/13/16 0534 03/14/16 0658  WBC 8.6 10.1 11.1* 8.8  HGB 11.7* 11.0* 10.9* 10.5*  HCT 34.3* 32.3* 32.5* 32.0*  MCV 86.0 84.8 87.1 88.6  PLT 407* 314 400 409*   Cardiac Enzymes: No results for input(s): CKTOTAL, CKMB, CKMBINDEX, TROPONINI in the last 168 hours. BNP: BNP (last 3 results) No results for input(s): BNP in the last 8760 hours.  ProBNP (last 3 results) No results for input(s): PROBNP in the last 8760 hours.  CBG: No results for input(s): GLUCAP in the last 168 hours.     Signed:  Zannie Cove MD.  [facility] 03/16/2016, 11:33 AM

## 2016-03-16 NOTE — Progress Notes (Signed)
Tara GermanFrances Frederick to be [name] Skilled nursing facility per MD order.  Discussed with the patient and all questions fully answered.  VSS. Dressing clean dry and intact on back.  IV catheter discontinued intact. Site without signs and symptoms of complications. Dressing and pressure applied.  An After Visit Summary was printed and given to the patient and family.  [name] education completed with patient/family including follow up instructions, medication list, [name] activities limitations if indicated, with other [name] instructions as indicated by MD - patient able to verbalize understanding, all questions fully answered.    Patient escorted via PTAR  Tara Frederick, Tara Frederick 03/16/2016 1:54 PM

## 2016-03-17 ENCOUNTER — Other Ambulatory Visit: Payer: Self-pay | Admitting: Internal Medicine

## 2016-03-21 ENCOUNTER — Encounter (HOSPITAL_COMMUNITY): Payer: Self-pay | Admitting: Interventional Radiology

## 2016-03-24 ENCOUNTER — Telehealth (HOSPITAL_COMMUNITY): Payer: Self-pay

## 2016-03-24 NOTE — Telephone Encounter (Signed)
Pt's daughter stated that she was feeling better and did not want to do MRI rght now, will wait until f/u and speak with Deveshwar then . AW

## 2016-03-29 ENCOUNTER — Ambulatory Visit (HOSPITAL_COMMUNITY)
Admission: RE | Admit: 2016-03-29 | Discharge: 2016-03-29 | Disposition: A | Discharge status: Home / Self Care | Payer: Medicare Other | Source: Ambulatory Visit | Attending: Radiology | Admitting: Radiology

## 2016-03-29 DIAGNOSIS — IMO0002 Reserved for concepts with insufficient information to code with codable children: Secondary | ICD-10-CM

## 2016-03-29 DIAGNOSIS — M549 Dorsalgia, unspecified: Secondary | ICD-10-CM

## 2016-04-03 ENCOUNTER — Encounter: Payer: Medicare Other | Attending: Surgery | Admitting: Surgery

## 2016-04-03 DIAGNOSIS — X58XXXD Exposure to other specified factors, subsequent encounter: Secondary | ICD-10-CM | POA: Diagnosis not present

## 2016-04-03 DIAGNOSIS — K219 Gastro-esophageal reflux disease without esophagitis: Secondary | ICD-10-CM | POA: Diagnosis not present

## 2016-04-03 DIAGNOSIS — J45909 Unspecified asthma, uncomplicated: Secondary | ICD-10-CM | POA: Insufficient documentation

## 2016-04-03 DIAGNOSIS — S32010D Wedge compression fracture of first lumbar vertebra, subsequent encounter for fracture with routine healing: Secondary | ICD-10-CM | POA: Diagnosis not present

## 2016-04-03 DIAGNOSIS — L89153 Pressure ulcer of sacral region, stage 3: Secondary | ICD-10-CM | POA: Insufficient documentation

## 2016-04-03 DIAGNOSIS — M1990 Unspecified osteoarthritis, unspecified site: Secondary | ICD-10-CM | POA: Insufficient documentation

## 2016-04-03 DIAGNOSIS — Z79899 Other long term (current) drug therapy: Secondary | ICD-10-CM | POA: Diagnosis not present

## 2016-04-03 DIAGNOSIS — Z880 Allergy status to penicillin: Secondary | ICD-10-CM | POA: Diagnosis not present

## 2016-04-03 DIAGNOSIS — I10 Essential (primary) hypertension: Secondary | ICD-10-CM | POA: Insufficient documentation

## 2016-04-03 NOTE — Progress Notes (Signed)
Tara Frederick ([id number]) Visit Report for 04/03/2016 Chief Complaint Document Details Patient Name: Tara Frederick Date of Service: 04/03/2016 8:45 AM Medical Record Number: [id number] Patient Account Number: [id number] Date of Birth/Sex: [birth year] (82 y.o. Female) Treating RN: Curtis Sites Primary Care Physician: SYSTEM, PCP Other Clinician: Referring Physician: Treating Physician/Extender: Rudene Re in Treatment: 0 Information Obtained from: Patient Chief Complaint Patient is at the clinic for treatment of an open pressure ulcer to the sacral region for about 2 weeks Electronic Signature(s) Signed: 04/03/2016 9:55:42 AM By: Evlyn Kanner MD, FACS Entered By: Evlyn Kanner on 04/03/2016 09:55:42 Tara Frederick ([id number]) -------------------------------------------------------------------------------- Debridement Details Patient Name: Tara Frederick Date of Service: 04/03/2016 8:45 AM Medical Record Number: [id number] Patient Account Number: [id number] Date of Birth/Sex: 1924-03-05 (82 y.o. Female) Treating RN: Curtis Sites Primary Care Physician: SYSTEM, PCP Other Clinician: Referring Physician: Treating Physician/Extender: Rudene Re in Treatment: 0 Debridement Performed for Wound #1 Midline Sacrum Assessment: Performed By: Physician Evlyn Kanner, MD Debridement: Debridement Pre-procedure Yes - 09:34 Verification/Time Out Taken: Start Time: 09:35 Pain Control: Lidocaine 4% Topical Solution Level: Skin/Subcutaneous Tissue Total Area Debrided (L x 0.8 (cm) x 1 (cm) = 0.8 (cm) W): Tissue and other Viable, Non-Viable, Eschar, Fibrin/Slough, Subcutaneous material debrided: Instrument: Curette Bleeding: Minimum Hemostasis Achieved: Pressure End Time: 09:37 Procedural Pain: 0 Post Procedural Pain: 0 Response to Treatment: Procedure was tolerated well Post Debridement Measurements of Total Wound Length: (cm) 0.8 Stage: Category/Stage III Width:  (cm) 1 Depth: (cm) 0.2 Volume: (cm) 0.126 Character of Wound/Ulcer Post Requires Further Debridement: Debridement Severity of Tissue Post Fat layer exposed Debridement: Post Procedure Diagnosis Same as Pre-procedure Electronic Signature(s) Signed: 04/03/2016 9:55:22 AM By: Evlyn Kanner MD, FACS Signed: 04/03/2016 4:44:46 PM By: Curtis Sites Entered By: Evlyn Kanner on 04/03/2016 09:55:21 Tara Frederick, Tara Frederick ([id number]) Tara Frederick, Tara Frederick ([id number]) -------------------------------------------------------------------------------- HPI Details Patient Name: Tara Frederick Date of Service: 04/03/2016 8:45 AM Medical Record Number: [id number] Patient Account Number: [id number] Date of Birth/Sex: 05/06/1924 (82 y.o. Female) Treating RN: Curtis Sites Primary Care Physician: SYSTEM, PCP Other Clinician: Referring Physician: Treating Physician/Extender: Rudene Re in Treatment: 0 History of Present Illness Location: sacral region Quality: Patient reports experiencing a sharp pain to affected area(s). Severity: Patient states wound are getting worse. Duration: Patient has had the wound for < 2 weeks prior to presenting for treatment Timing: Pain in wound is Intermittent (comes and goes Context: The wound would happen gradually Modifying Factors: Other treatment(s) tried include:since L1 compression fracturesurgery for a Associated Signs and Symptoms: Patient reports having difficulty standing for long periods. HPI Description: 82 year old patient who was recently admitted to [facility] on July 28 was discharged on August 10. she was treated for severe constipation, L1 compression fracture and is status post kyphoplasty on 8/8. also treated for a UTI with Levaquin and continued on supportive care. Past medical history significant for hypertension, GERD, degenerative disc disease. Status post appendectomy, hemorrhoid surgery and a total abdominal hysterectomy with bilateral  salpingo- oophorectomy. Present she is in a rehabilitation facility and ambulating with a walker. Her protein intake is said to be good and she is on vitamin supplements. She has an appropriate bed surface. Electronic Signature(s) Signed: 04/03/2016 9:59:28 AM By: Evlyn Kanner MD, FACS Entered By: Evlyn Kanner on 04/03/2016 09:59:28 Tara Frederick, Tara Frederick ([id number]) -------------------------------------------------------------------------------- Physical Exam Details Patient Name: Tara Frederick Date of Service: 04/03/2016 8:45 AM Medical Record Number: [id number] Patient Account Number: [id number] Date of Birth/Sex: [birth year] (82 y.o. Female) Treating RN: Curtis Sites Primary Care Physician: SYSTEM,  PCP Other Clinician: Referring Physician: Treating Physician/Extender: Rudene Re in Treatment: 0 Constitutional . Pulse regular. Respirations normal and unlabored. Afebrile. . Eyes Nonicteric. Reactive to light. Ears, Nose, Mouth, and Throat Lips, teeth, and gums WNL.Marland Kitchen Moist mucosa without lesions. Neck supple and nontender. No palpable supraclavicular or cervical adenopathy. Normal sized without goiter. Respiratory WNL. No retractions.. Cardiovascular Pedal Pulses WNL. No clubbing, cyanosis or edema. Chest Breasts symmetical and no nipple discharge.. Breast tissue WNL, no masses, lumps, or tenderness.. Gastrointestinal (GI) Abdomen without masses or tenderness.. No liver or spleen enlargement or tenderness.. Lymphatic No adneopathy. No adenopathy. No adenopathy. Musculoskeletal Adexa without tenderness or enlargement.. Digits and nails w/o clubbing, cyanosis, infection, petechiae, ischemia, or inflammatory conditions.. Integumentary (Hair, Skin) No suspicious lesions. No crepitus or fluctuance. No peri-wound warmth or erythema. No masses.Marland Kitchen Psychiatric Judgement and insight Intact.. No evidence of depression, anxiety, or agitation.. Notes stage III decubitus ulcer in the  region of the sacrum and had significant slough which have sharply debrided with a #3 curet. Bleeding controlled with pressure. Electronic Signature(s) Signed: 04/03/2016 10:00:02 AM By: Evlyn Kanner MD, FACS Entered By: Evlyn Kanner on 04/03/2016 10:00:01 Tara Frederick, Tara Frederick ([id number]) -------------------------------------------------------------------------------- Physician Orders Details Patient Name: Tara Frederick Date of Service: 04/03/2016 8:45 AM Medical Record Number: [id number] Patient Account Number: [id number] Date of Birth/Sex: 09/28/1923 (82 y.o. Female) Treating RN: Curtis Sites Primary Care Physician: SYSTEM, PCP Other Clinician: Referring Physician: Treating Physician/Extender: Rudene Re in Treatment: 0 Verbal / Phone Orders: Yes Clinician: Curtis Sites Read Back and Verified: Yes Diagnosis Coding Wound Cleansing Wound #1 Midline Sacrum o Clean wound with Normal Saline. Anesthetic Wound #1 Midline Sacrum o Topical Lidocaine 4% cream applied to wound bed prior to debridement Skin Barriers/Peri-Wound Care Wound #1 Midline Sacrum o Skin Prep Primary Wound Dressing Wound #1 Midline Sacrum o Santyl Ointment Secondary Dressing Wound #1 Midline Sacrum o Dry Gauze o Boardered Foam Dressing Dressing Change Frequency Wound #1 Midline Sacrum o Change dressing every day. Follow-up Appointments Wound #1 Midline Sacrum o Return Appointment in 2 weeks. Off-Loading Wound #1 Midline Sacrum o Turn and reposition every 2 hours Additional Orders / Instructions Tara Frederick, Tara Frederick ([id number]) Wound #1 Midline Sacrum o Increase protein intake. Medications-please add to medication list. Wound #1 Midline Sacrum o Other: - Please add vitamin C and zinc and vitamin A to patient's medications daily Patient Medications Allergies: prednisone, Lortab, Toprol XL, morphine, Macrobid, Evista, diuretic, tetracycline HCl, Fosamax, Neosporin  (neo-bac-polym), Actonel, penicillin, Biaxin, tramadol, Ceftin Notifications Medication Indication Start End Santyl 04/03/2016 DOSE topical 250 unit/gram ointment - ointment topical as directed Electronic Signature(s) Signed: 04/03/2016 10:01:35 AM By: Evlyn Kanner MD, FACS Entered By: Evlyn Kanner on 04/03/2016 10:01:34 Tara Frederick ([id number]) -------------------------------------------------------------------------------- Problem List Details Patient Name: Tara Frederick Date of Service: 04/03/2016 8:45 AM Medical Record Number: [id number] Patient Account Number: [id number] Date of Birth/Sex: 10/14/1923 (82 y.o. Female) Treating RN: Curtis Sites Primary Care Physician: SYSTEM, PCP Other Clinician: Referring Physician: Treating Physician/Extender: Rudene Re in Treatment: 0 Active Problems ICD-10 Encounter Code Description Active Date Diagnosis L89.153 Pressure ulcer of sacral region, stage 3 04/03/2016 Yes S32.010D Wedge compression fracture of first lumbar vertebra, 04/03/2016 Yes subsequent encounter for fracture with routine healing Inactive Problems Resolved Problems Electronic Signature(s) Signed: 04/03/2016 9:55:09 AM By: Evlyn Kanner MD, FACS Previous Signature: 04/03/2016 9:42:27 AM Version By: Evlyn Kanner MD, FACS Entered By: Evlyn Kanner on 04/03/2016 09:55:09 Tara Frederick ([id number]) -------------------------------------------------------------------------------- Progress Note Details Patient Name: Tara Frederick Date of Service: 04/03/2016 8:45 AM  Medical Record Number: [id number] Patient Account Number: [id number] Date of Birth/Sex: 10/23/1923 (82 y.o. Female) Treating RN: Curtis Sitesorthy, Joanna Primary Care Physician: SYSTEM, PCP Other Clinician: Referring Physician: Treating Physician/Extender: Rudene ReBritto, Chauncey Sciulli Weeks in Treatment: 0 Subjective Chief Complaint Information obtained from Patient Patient is at the clinic for treatment of an open pressure  ulcer to the sacral region for about 2 weeks History of Present Illness (HPI) The following HPI elements were documented for the patient's wound: Location: sacral region Quality: Patient reports experiencing a sharp pain to affected area(s). Severity: Patient states wound are getting worse. Duration: Patient has had the wound for < 2 weeks prior to presenting for treatment Timing: Pain in wound is Intermittent (comes and goes Context: The wound would happen gradually Modifying Factors: Other treatment(s) tried include:since L1 compression fracturesurgery for a Associated Signs and Symptoms: Patient reports having difficulty standing for long periods. 82 year old patient who was recently admitted to [facility] on July 28 was discharged on August 10. she was treated for severe constipation, L1 compression fracture and is status post kyphoplasty on 8/8. also treated for a UTI with Levaquin and continued on supportive care. Past medical history significant for hypertension, GERD, degenerative disc disease. Status post appendectomy, hemorrhoid surgery and a total abdominal hysterectomy with bilateral salpingo- oophorectomy. Present she is in a rehabilitation facility and ambulating with a walker. Her protein intake is said to be good and she is on vitamin supplements. She has an appropriate bed surface. Wound History Patient presents with 1 open wound that has been present for approximately 2 weeks. Patient has been treating wound in the following manner: silvadene. Laboratory tests have not been performed in the last month. Patient reportedly has not tested positive for an antibiotic resistant organism. Patient reportedly has not tested positive for osteomyelitis. Patient reportedly has not had testing performed to evaluate circulation in the legs. Patient experiences the following problems associated with their wounds: infection, swelling. Patient History Information obtained from  Patient. Allergies prednisone, Lortab, Toprol XL, morphine, Macrobid, Evista, diuretic, tetracycline HCl, Fosamax, Neosporin Bredeson, Embree ([id number]) (neo-bac-polym), Actonel, penicillin, Biaxin, tramadol, Ceftin Family History Cancer - Siblings, Heart Disease - Siblings, Mother, Hypertension - Siblings, Mother, Thyroid Problems - Child, Siblings, No family history of Diabetes, Hereditary Spherocytosis, Kidney Disease, Lung Disease, Seizures, Stroke, Tuberculosis. Social History Never smoker, Marital Status - Widowed, Alcohol Use - Never, Drug Use - No History, Caffeine Use - Daily. Medical History Eyes Patient has history of Cataracts, Glaucoma Respiratory Patient has history of Asthma Cardiovascular Patient has history of Hypertension Musculoskeletal Patient has history of Osteoarthritis Review of Systems (ROS) Constitutional Symptoms (General Health) The patient has no complaints or symptoms. Ear/Nose/Mouth[facility]Throat [facility] Hematologic/Lymphatic The patient has no complaints or symptoms. Gastrointestinal The patient has no complaints or symptoms. Endocrine small goiter Genitourinary The patient has no complaints or symptoms. Immunological The patient has no complaints or symptoms. Integumentary (Skin) Complains or has symptoms of Wounds. Neurologic The patient has no complaints or symptoms. Oncologic The patient has no complaints or symptoms. Psychiatric The patient has no complaints or symptoms. Medications Ingraham, Rebie ([id number]) fexofenadine 180 mg tablet oral 1 1 tablet oral daily Refresh Tears 0.5 % eye drops ophthalmic drops ophthalmicas needed Ventolin HFA 90 mcg/actuation aerosol inhaler inhalation 1 1 HFA aerosol inhaler inhalation daily Prolia 60 mg/mL subcutaneous syringe subcutaneous 1 1 syringe subcutaneous every six months amlodipine 5 mg tablet oral 1 1 tablet oral daily latanoprost 0.005 % eye drops ophthalmic 1 1 drops  ophthalmic into left eye each evening Centrum Silver 0.4 mg-300 mcg-250 mcg tablet oral 1 1 tablet oral daily Flonase Allergy Relief 50 mcg/actuation nasal spray,suspension nasal spray,suspension nasal one spray in each nostril daily erythromycin 5 mg/gram (0.5 %) eye ointment ophthalmic ointment ophthalmic as directed Protonix 40 mg tablet,delayed release oral 1 1 tablet,delayed release (DR/EC) oral daily fluticasone 0.005 % topical ointment topical ointment topical one application daily Santyl 250 unit/gram topical ointment topical ointment topical as directed ergocalciferol (vitamin D2) 50,000 unit capsule oral 1 1 capsule oral monthly Objective Constitutional Pulse regular. Respirations normal and unlabored. Afebrile. Vitals Time Taken: 9:09 AM, Height: 61 in, Source: Stated, Weight: 113 lbs, Source: Stated, BMI: 21.3, Temperature: 98 F, Pulse: 98 bpm, Respiratory Rate: 18 breaths/min, Blood Pressure: 117/64 mmHg. Eyes Nonicteric. Reactive to light. Ears, Nose, Mouth, and Throat Lips, teeth, and gums WNL.Marland Kitchen Moist mucosa without lesions. Neck supple and nontender. No palpable supraclavicular or cervical adenopathy. Normal sized without goiter. Respiratory WNL. No retractions.. Cardiovascular Pedal Pulses WNL. No clubbing, cyanosis or edema. Chest Breasts symmetical and no nipple discharge.. Breast tissue WNL, no masses, lumps, or tenderness.. Gastrointestinal (GI) Abdomen without masses or tenderness.. No liver or spleen enlargement or tenderness.. Lymphatic Tara Frederick, Tara Frederick ([id number]) No adneopathy. No adenopathy. No adenopathy. Musculoskeletal Adexa without tenderness or enlargement.. Digits and nails w/o clubbing, cyanosis, infection, petechiae, ischemia, or inflammatory conditions.Marland Kitchen Psychiatric Judgement and insight Intact.. No evidence of depression, anxiety, or agitation.. General Notes: stage III decubitus ulcer in the region of the sacrum and had significant slough  which have sharply debrided with a #3 curet. Bleeding controlled with pressure. Integumentary (Hair, Skin) No suspicious lesions. No crepitus or fluctuance. No peri-wound warmth or erythema. No masses.. Wound #1 status is Open. Original cause of wound was Pressure Injury. The wound is located on the Midline Sacrum. The wound measures 0.8cm length x 1cm width x 0.2cm depth; 0.628cm^2 area and 0.126cm^3 volume. The wound is limited to skin breakdown. There is no tunneling or undermining noted. There is a large amount of serous drainage noted. The wound margin is flat and intact. There is no granulation within the wound bed. There is a large (67-100%) amount of necrotic tissue within the wound bed including Eschar and Adherent Slough. The periwound skin appearance exhibited: Moist, Erythema. The periwound skin appearance did not exhibit: Callus, Crepitus, Excoriation, Fluctuance, Friable, Induration, Localized Edema, Rash, Scarring, Dry/Scaly, Maceration, Atrophie Blanche, Cyanosis, Ecchymosis, Hemosiderin Staining, Mottled, Pallor, Rubor. The surrounding wound skin color is noted with erythema which is circumferential. Periwound temperature was noted as No Abnormality. The periwound has tenderness on palpation. Assessment Active Problems ICD-10 L89.153 - Pressure ulcer of sacral region, stage 3 S32.010D - Wedge compression fracture of first lumbar vertebra, subsequent encounter for fracture with routine healing pleasant 82 year old who is ambulatory with help has a sacral decubitus ulcer after recent hospitalization and possible being on the bed for too long. I have recommended: 1. Santyl ointment to be applied daily with a bordered foam. 2. sent off loading and laying on a proper mattress appropriate for her care Tara Frederick, Tara Frederick ([id number]) 3. Appropriate amount of proteins and vitamins A, C and zinc 4. Regular visits to the wound center She and her daughter at the bedside have had all  questions answered. Procedures Wound #1 Wound #1 is a Pressure Ulcer located on the Midline Sacrum . There was a Skin/Subcutaneous Tissue Debridement ([id number]) debridement with total area of 0.8 sq cm performed by Evlyn Kanner, MD. with the following instrument(s):  Curette to remove Viable and Non-Viable tissue/material including Fibrin/Slough, Eschar, and Subcutaneous after achieving pain control using Lidocaine 4% Topical Solution. A time out was conducted at 09:34, prior to the start of the procedure. A Minimum amount of bleeding was controlled with Pressure. The procedure was tolerated well with a pain level of 0 throughout and a pain level of 0 following the procedure. Post Debridement Measurements: 0.8cm length x 1cm width x 0.2cm depth; 0.126cm^3 volume. Post debridement Stage noted as Category/Stage III. Character of Wound/Ulcer Post Debridement requires further debridement. Severity of Tissue Post Debridement is: Fat layer exposed. Post procedure Diagnosis Wound #1: Same as Pre-Procedure Plan Wound Cleansing: Wound #1 Midline Sacrum: Clean wound with Normal Saline. Anesthetic: Wound #1 Midline Sacrum: Topical Lidocaine 4% cream applied to wound bed prior to debridement Skin Barriers/Peri-Wound Care: Wound #1 Midline Sacrum: Skin Prep Primary Wound Dressing: Wound #1 Midline Sacrum: Santyl Ointment Secondary Dressing: Wound #1 Midline Sacrum: Dry Gauze Boardered Foam Dressing Dressing Change Frequency: Wound #1 Midline Sacrum: Change dressing every day. Follow-up Appointments: Wound #1 Midline Sacrum: Return Appointment in 2 weeks. Tara Frederick, Tara Frederick ([id number]) Off-Loading: Wound #1 Midline Sacrum: Turn and reposition every 2 hours Additional Orders / Instructions: Wound #1 Midline Sacrum: Increase protein intake. Medications-please add to medication list.: Wound #1 Midline Sacrum: Other: - Please add vitamin C and zinc and vitamin A to patient's medications  daily The following medication(s) was prescribed: Santyl topical 250 unit/gram ointment ointment topical as directed starting 04/03/2016 pleasant 82 year old who is ambulatory with help has a sacral decubitus ulcer after recent hospitalization and possible being on the bed for too long. I have recommended: 1. Santyl ointment to be applied daily with a bordered foam. 2. sent off loading and laying on a proper mattress appropriate for her care 3. Appropriate amount of proteins and vitamins A, C and zinc 4. Regular visits to the wound center She and her daughter at the bedside have had all questions answered. Electronic Signature(s) Signed: 04/03/2016 10:03:38 AM By: Evlyn Kanner MD, FACS Entered By: Evlyn Kanner on 04/03/2016 10:03:37 Tara Frederick, Tara Frederick ([id number]) -------------------------------------------------------------------------------- ROS/PFSH Details Patient Name: Tara Frederick Date of Service: 04/03/2016 8:45 AM Medical Record Number: [id number] Patient Account Number: [id number] Date of Birth/Sex: 1924-03-07 (82 y.o. Female) Treating RN: Curtis Sites Primary Care Physician: SYSTEM, PCP Other Clinician: Referring Physician: Treating Physician/Extender: Rudene Re in Treatment: 0 Information Obtained From Patient Wound History Do you currently have one or more open woundso Yes How many open wounds do you currently haveo 1 Approximately how long have you had your woundso 2 weeks How have you been treating your wound(s) until nowo silvadene Has your wound(s) ever healed and then re-openedo No Have you had any lab work done in the past montho No Have you tested positive for an antibiotic resistant organism (MRSA, VRE)o No Have you tested positive for osteomyelitis (bone infection)o No Have you had any tests for circulation on your legso No Have you had other problems associated with your woundso Infection, Swelling Integumentary (Skin) Complaints and  Symptoms: Positive for: Wounds Constitutional Symptoms (General Health) Complaints and Symptoms: No Complaints or Symptoms Eyes Medical History: Positive for: Cataracts; Glaucoma Ear/Nose/Mouth/Throat Complaints and Symptoms: Review of System Notes: wear hearing aides [facility] Hematologic/Lymphatic Complaints and Symptoms: No Complaints or Symptoms Respiratory Tara Frederick, Tara Frederick ([id number]) Medical History: Positive for: Asthma Cardiovascular Medical History: Positive for: Hypertension Gastrointestinal Complaints and Symptoms: No Complaints or Symptoms Endocrine Complaints and Symptoms: Review of System Notes: small goiter Genitourinary Complaints and Symptoms:  No Complaints or Symptoms Immunological Complaints and Symptoms: No Complaints or Symptoms Musculoskeletal Medical History: Positive for: Osteoarthritis Neurologic Complaints and Symptoms: No Complaints or Symptoms Oncologic Complaints and Symptoms: No Complaints or Symptoms Psychiatric Complaints and Symptoms: No Complaints or Symptoms HBO Extended History Items Eyes: Eyes: Cataracts Glaucoma Tara Frederick, Tara Frederick ([id number]) Family and Social History Cancer: Yes - Siblings; Diabetes: No; Heart Disease: Yes - Siblings, Mother; Hereditary Spherocytosis: No; Hypertension: Yes - Siblings, Mother; Kidney Disease: No; Lung Disease: No; Seizures: No; Stroke: No; Thyroid Problems: Yes - Child, Siblings; Tuberculosis: No; Never smoker; Marital Status - Widowed; Alcohol Use: Never; Drug Use: No History; Caffeine Use: Daily; Financial Concerns: No; Food, Clothing or Shelter Needs: No; Support System Lacking: No; Transportation Concerns: No; Advanced Directives: No; Patient does not want information on Advanced Directives; Do not resuscitate: No; Living Will: Yes (Not Provided); Medical Power of Attorney: Yes - Everlene Other, Celine Mans (Not Provided) Physician Affirmation I have reviewed and agree with the above  information. Electronic Signature(s) Signed: 04/03/2016 3:52:04 PM By: Evlyn Kanner MD, FACS Signed: 04/03/2016 4:44:46 PM By: Curtis Sites Entered By: Evlyn Kanner on 04/03/2016 09:30:31 JISSELL, TRAFTON ([id number]) -------------------------------------------------------------------------------- SuperBill Details Patient Name: Tara Frederick Date of Service: 04/03/2016 Medical Record Number: [id number] Patient Account Number: [id number] Date of Birth/Sex: 1924-03-07 (82 y.o. Female) Treating RN: Curtis Sites Primary Care Physician: SYSTEM, PCP Other Clinician: Referring Physician: Treating Physician/Extender: Rudene Re in Treatment: 0 Diagnosis Coding ICD-10 Codes Code Description L89.153 Pressure ulcer of sacral region, stage 3 Wedge compression fracture of first lumbar vertebra, subsequent encounter for fracture S32.010D with routine healing Facility Procedures CPT4: Description Modifier Quantity Code [id number] - WOUND CARE VISIT-LEV 3 EST PT 1 CPT4: 40102725 11042 - DEB SUBQ TISSUE 20 SQ CM/< 1 ICD-10 Description Diagnosis L89.153 Pressure ulcer of sacral region, stage 3 S32.010D Wedge compression fracture of first lumbar vertebra, subsequent encounter for fracture with routine healing Physician Procedures CPT4: Description Modifier Quantity Code [id number] 99204 - WC PHYS LEVEL 4 - NEW PT 25 1 ICD-10 Description Diagnosis L89.153 Pressure ulcer of sacral region, stage 3 S32.010D Wedge compression fracture of first lumbar vertebra, subsequent encounter for  fracture with routine healing CPT4: 4742595 11042 - WC PHYS SUBQ TISS 20 SQ CM 1 ICD-10 Description Diagnosis L89.153 Pressure ulcer of sacral region, stage 3 S32.010D Wedge compression fracture of first lumbar vertebra, subsequent encounter for fracture with routine healing SHAWNTELLE, UNGAR ([id number]) Electronic Signature(s) Signed: 04/03/2016 10:04:16 AM By: Evlyn Kanner MD, FACS Entered By: Evlyn Kanner on  04/03/2016 10:04:15

## 2016-04-03 NOTE — Progress Notes (Addendum)
Tara Frederick, Tara Frederick ([id number]) Visit Report for 04/03/2016 Abuse/Suicide Risk Screen Details Patient Name: Tara Frederick, Tara Frederick Date of Service: 04/03/2016 8:45 AM Medical Record Number: [id number] Patient Account Number: [id number] Date of Birth/Sex: Jun 21, 1924 (82 y.o. Female) Treating RN: Curtis Sitesorthy, Joanna Primary Care Physician: SYSTEM, PCP Other Clinician: Referring Physician: Treating Physician/Extender: Rudene ReBritto, Errol Weeks in Treatment: 0 Abuse/Suicide Risk Screen Items Answer ABUSE/SUICIDE RISK SCREEN: Has anyone close to you tried to hurt or harm you recentlyo No Do you feel uncomfortable with anyone in your familyo No Has anyone forced you do things that you didnot want to doo No Do you have any thoughts of harming yourselfo No Patient displays signs or symptoms of abuse and/or neglect. No Electronic Signature(s) Signed: 04/03/2016 4:44:46 PM By: Curtis Sitesorthy, Joanna Entered By: Curtis Sitesorthy, Joanna on 04/03/2016 09:22:35 Tara Frederick, Tara Frederick ([id number]) -------------------------------------------------------------------------------- Activities of Daily Living Details Patient Name: Tara Frederick, Tara Frederick Date of Service: 04/03/2016 8:45 AM Medical Record Number: [id number] Patient Account Number: [id number] Date of Birth/Sex: Jun 21, 1924 (82 y.o. Female) Treating RN: Curtis Sitesorthy, Joanna Primary Care Physician: SYSTEM, PCP Other Clinician: Referring Physician: Treating Physician/Extender: Rudene ReBritto, Errol Weeks in Treatment: 0 Activities of Daily Living Items Answer Activities of Daily Living (Please select one for each item) Drive Automobile Not Able Take Medications Need Assistance Use Telephone Completely Able Care for Appearance Need Assistance Use Toilet Need Assistance Bath / Shower Need Assistance Dress Self Need Assistance Feed Self Completely Able Walk Need Assistance Get In / Out Bed Need Assistance Housework Not Able Prepare Meals Not Able Handle Money Need Assistance Shop for Self Not  Able Electronic Signature(s) Signed: 04/03/2016 4:44:46 PM By: Curtis Sitesorthy, Joanna Entered By: Curtis Sitesorthy, Joanna on 04/03/2016 09:23:08 Tara Frederick, Tara Frederick ([id number]) -------------------------------------------------------------------------------- Education Assessment Details Patient Name: Tara Frederick, Tara Frederick Date of Service: 04/03/2016 8:45 AM Medical Record Number: [id number] Patient Account Number: [id number] Date of Birth/Sex: Jun 21, 1924 (82 y.o. Female) Treating RN: Curtis Sitesorthy, Joanna Primary Care Physician: SYSTEM, PCP Other Clinician: Referring Physician: Treating Physician/Extender: Rudene ReBritto, Errol Weeks in Treatment: 0 Primary Learner Assessed: Patient Learning Preferences/Education Level/Primary Language Learning Preference: Explanation, Printed Material Highest Education Level: High School Preferred Language: English Cognitive Barrier Assessment/Beliefs Language Barrier: No Translator Needed: No Memory Deficit: No Emotional Barrier: No Cultural/Religious Beliefs Affecting Medical No Care: Physical Barrier Assessment Impaired Vision: No Impaired Hearing: Yes Hearing Aid Decreased Hand dexterity: No Knowledge/Comprehension Assessment Knowledge Level: High Comprehension Level: High Ability to understand written High instructions: Ability to understand verbal High instructions: Motivation Assessment Anxiety Level: Calm Cooperation: Cooperative Education Importance: Acknowledges Need Interest in Health Problems: Asks Questions Perception: Coherent Willingness to Engage in Self- High Management Activities: Readiness to Engage in Self- High Management Activities: Electronic Signature(s) Tara Frederick, Tara Frederick ([id number]) Signed: 04/03/2016 4:44:46 PM By: Curtis Sitesorthy, Joanna Entered By: Curtis Sitesorthy, Joanna on 04/03/2016 09:23:31 Tara Frederick, Tara Frederick ([id number]) -------------------------------------------------------------------------------- Fall Risk Assessment Details Patient Name: Tara Frederick, Tara Frederick Date  of Service: 04/03/2016 8:45 AM Medical Record Number: [id number] Patient Account Number: [id number] Date of Birth/Sex: Jun 21, 1924 (82 y.o. Female) Treating RN: Curtis Sitesorthy, Joanna Primary Care Physician: SYSTEM, PCP Other Clinician: Referring Physician: Treating Physician/Extender: Rudene ReBritto, Errol Weeks in Treatment: 0 Fall Risk Assessment Items Have you had 2 or more falls in the last 12 monthso 0 No Have you had any fall that resulted in injury in the last 12 monthso 0 No FALL RISK ASSESSMENT: History of falling - immediate or within 3 months 0 No Secondary diagnosis 15 Yes Ambulatory aid None/bed rest/wheelchair/nurse 0 No Crutches/cane/walker 15 Yes Furniture 0 No IV Access/Saline Lock 0 No Gait/Training Normal/bed rest/immobile 0 No Weak 10 Yes  Impaired 0 No Mental Status Oriented to own ability 0 Yes Electronic Signature(s) Signed: 04/03/2016 4:44:46 PM By: Curtis Sites Entered By: Curtis Sites on 04/03/2016 09:23:56 Mccary, Keayra ([id number]) -------------------------------------------------------------------------------- Foot Assessment Details Patient Name: Tara Frederick Date of Service: 04/03/2016 8:45 AM Medical Record Number: [id number] Patient Account Number: [id number] Date of Birth/Sex: October 19, 1923 (82 y.o. Female) Treating RN: Curtis Sites Primary Care Physician: SYSTEM, PCP Other Clinician: Referring Physician: Treating Physician/Extender: Rudene Re in Treatment: 0 Foot Assessment Items Site Locations + = Sensation present, - = Sensation absent, C = Callus, U = Ulcer R = Redness, W = Warmth, M = Maceration, PU = Pre-ulcerative lesion F = Fissure, S = Swelling, D = Dryness Assessment Right: Left: Other Deformity: No No Prior Foot Ulcer: No No Prior Amputation: No No Charcot Joint: No No Ambulatory Status: Gait: Electronic Signature(s) Signed: 04/03/2016 4:44:46 PM By: Curtis Sites Entered By: Curtis Sites on 04/03/2016 09:24:28 Tara Frederick ([id number]) -------------------------------------------------------------------------------- Nutrition Risk Assessment Details Patient Name: Tara Frederick Date of Service: 04/03/2016 8:45 AM Medical Record Number: [id number] Patient Account Number: [id number] Date of Birth/Sex: [birth year] (82 y.o. Female) Treating RN: Curtis Sites Primary Care Physician: SYSTEM, PCP Other Clinician: Referring Physician: Treating Physician/Extender: Rudene Re in Treatment: 0 Height (in): 61 Weight (lbs): 113 Body Mass Index (BMI): 21.3 Nutrition Risk Assessment Items NUTRITION RISK SCREEN: I have an illness or condition that made me change the kind and/or 0 No amount of food I eat I eat fewer than two meals per day 0 No I eat few fruits and vegetables, or milk products 0 No I have three or more drinks of beer, liquor or wine almost every day 0 No I have tooth or mouth problems that make it hard for me to eat 0 No I don't always have enough money to buy the food I need 0 No I eat alone most of the time 0 No I take three or more different prescribed or over-the-counter drugs a 1 Yes day Without wanting to, I have lost or gained 10 pounds in the last six 0 No months I am not always physically able to shop, cook and/or feed myself 0 No Nutrition Protocols Good Risk Protocol 0 No interventions needed Moderate Risk Protocol Electronic Signature(s) Signed: 04/03/2016 4:44:46 PM By: Curtis Sites Previous Signature: 04/03/2016 9:21:54 AM Version By: Curtis Sites Entered By: Curtis Sites on 04/03/2016 09:24:14

## 2016-04-03 NOTE — Progress Notes (Signed)
Tara Frederick, [name] ([id number]) Visit Report for 04/03/2016 Allergy List Details Patient Name: Tara Frederick, Tara Frederick Date of Service: 04/03/2016 8:45 AM Medical Record Number: [id number] Patient Account Number: [id number] Date of Birth/Sex: February 06, 1924 (82 y.o. Female) Treating RN: Curtis Sitesorthy, Joanna Primary Care Physician: SYSTEM, PCP Other Clinician: Referring Physician: Treating Physician/Extender: Rudene ReBritto, Errol Weeks in Treatment: 0 Allergies Active Allergies prednisone Lortab Toprol XL morphine Macrobid Evista diuretic tetracycline HCl Fosamax Neosporin (neo-bac-polym) Actonel penicillin Biaxin tramadol Ceftin Allergy Notes Tara Frederick, Tara Frederick ([id number]) Electronic Signature(s) Signed: 04/03/2016 4:44:46 PM By: Curtis Sitesorthy, Joanna Entered By: Curtis Sitesorthy, Joanna on 04/03/2016 09:14:03 Tara Frederick, [name] ([id number]) -------------------------------------------------------------------------------- Arrival Information Details Patient Name: Tara Frederick, Tara Frederick Date of Service: 04/03/2016 8:45 AM Medical Record Number: [id number] Patient Account Number: [id number] Date of Birth/Sex: February 06, 1924 (82 y.o. Female) Treating RN: Curtis Sitesorthy, Joanna Primary Care Physician: SYSTEM, PCP Other Clinician: Referring Physician: Treating Physician/Extender: Rudene ReBritto, Errol Weeks in Treatment: 0 Visit Information Patient Arrived: Walker Arrival Time: 09:08 Accompanied By: dtr Transfer Assistance: None Patient Identification Verified: Yes Secondary Verification Process Completed: Yes Electronic Signature(s) Signed: 04/03/2016 4:44:46 PM By: Curtis Sitesorthy, Joanna Entered By: Curtis Sitesorthy, Joanna on 04/03/2016 09:08:26 Tara Frederick, Surena ([id number]) -------------------------------------------------------------------------------- Clinic Level of Care Assessment Details Patient Name: Tara Frederick, Tara Frederick Date of Service: 04/03/2016 8:45 AM Medical Record Number: [id number] Patient Account Number: [id number] Date of Birth/Sex: February 06, 1924 (82 y.o.  Female) Treating RN: Curtis Sitesorthy, Joanna Primary Care Physician: SYSTEM, PCP Other Clinician: Referring Physician: Treating Physician/Extender: Rudene ReBritto, Errol Weeks in Treatment: 0 Clinic Level of Care Assessment Items TOOL 1 Quantity Score []  - Use when EandM and Procedure is performed on INITIAL visit 0 ASSESSMENTS - Nursing Assessment / Reassessment X - General Physical Exam (combine w/ comprehensive assessment (listed just 1 20 below) when performed on new pt. evals) X - Comprehensive Assessment (HX, ROS, Risk Assessments, Wounds Hx, etc.) 1 25 ASSESSMENTS - Wound and Skin Assessment / Reassessment []  - Dermatologic / Skin Assessment (not related to wound area) 0 ASSESSMENTS - Ostomy and/or Continence Assessment and Care []  - Incontinence Assessment and Management 0 []  - Ostomy Care Assessment and Management (repouching, etc.) 0 PROCESS - Coordination of Care X - Simple Patient / Family Education for ongoing care 1 15 []  - Complex (extensive) Patient / Family Education for ongoing care 0 X - Staff obtains ChiropractorConsents, Records, Test Results / Process Orders 1 10 []  - Staff telephones HHA, Nursing Homes / Clarify orders / etc 0 []  - Routine Transfer to another Facility (non-emergent condition) 0 []  - Routine Hospital Admission (non-emergent condition) 0 X - New Admissions / Manufacturing engineernsurance Authorizations / Ordering NPWT, Apligraf, etc. 1 15 []  - Emergency Hospital Admission (emergent condition) 0 PROCESS - Special Needs []  - Pediatric / Minor Patient Management 0 []  - Isolation Patient Management 0 [name], [name] ([id number]) []  - Hearing / Language / Visual special needs 0 []  - Assessment of Community assistance (transportation, D/C planning, etc.) 0 []  - Additional assistance / Altered mentation 0 []  - Support Surface(s) Assessment (bed, cushion, seat, etc.) 0 INTERVENTIONS - Miscellaneous []  - External ear exam 0 []  - Patient Transfer (multiple staff / Nurse, adultHoyer Lift / Similar devices) 0 []   - Simple Staple / Suture removal (25 or less) 0 []  - Complex Staple / Suture removal (26 or more) 0 []  - Hypo/Hyperglycemic Management (do not check if billed separately) 0 []  - Ankle / Brachial Index (ABI) - do not check if billed separately 0 Has the patient been seen at the hospital within the last three years: Yes Total Score: 85 Level Of Care:  New/Established - Level 3 Electronic Signature(s) Signed: 04/03/2016 4:44:46 PM By: Curtis Sites Entered By: Curtis Sites on 04/03/2016 09:35:36 AMORI, COOPERMAN ([id number]) -------------------------------------------------------------------------------- Encounter Discharge Information Details Patient Name: Tara Tara Frederick Date of Service: 04/03/2016 8:45 AM Medical Record Number: [id number] Patient Account Number: [id number] Date of Birth/Sex: May 23, 1924 (82 y.o. Female) Treating RN: Curtis Sites Primary Care Physician: SYSTEM, PCP Other Clinician: Referring Physician: Treating Physician/Extender: Rudene Re in Treatment: 0 Encounter Discharge Information Items Discharge Pain Level: 0 Discharge Condition: Stable Ambulatory Status: Walker Discharge Destination: Nursing Home Transportation: Private Auto Accompanied By: dtr Schedule Follow-up Appointment: Yes Medication Reconciliation completed No and provided to Patient/Care Megumi Treaster: Provided on Clinical Summary of Care: 04/03/2016 Form Type Recipient Paper Patient FH Electronic Signature(s) Signed: 04/03/2016 9:52:11 AM By: [name] Entered By: [name] on 04/03/2016 09:52:11 Tara Tara Frederick ([id number]) -------------------------------------------------------------------------------- Lower Extremity Assessment Details Patient Name: Tara Tara Frederick Date of Service: 04/03/2016 8:45 AM Medical Record Number: [id number] Patient Account Number: [id number] Date of Birth/Sex: 1924/01/19 (82 y.o. Female) Treating RN: Curtis Sites Primary Care Physician: SYSTEM,  PCP Other Clinician: Referring Physician: Treating Physician/Extender: Rudene Re in Treatment: 0 Electronic Signature(s) Signed: 04/03/2016 4:44:46 PM By: Curtis Sites Entered By: Curtis Sites on 04/03/2016 09:24:41 Tara Tara Frederick ([id number]) -------------------------------------------------------------------------------- Multi Wound Chart Details Patient Name: Tara Tara Frederick Date of Service: 04/03/2016 8:45 AM Medical Record Number: [id number] Patient Account Number: [id number] Date of Birth/Sex: 14-May-1924 (82 y.o. Female) Treating RN: Curtis Sites Primary Care Physician: SYSTEM, PCP Other Clinician: Referring Physician: Treating Physician/Extender: Rudene Re in Treatment: 0 Vital Signs Height(in): 61 Pulse(bpm): 98 Weight(lbs): 113 Blood Pressure 117/64 (mmHg): Body Mass Index(BMI): 21 Temperature(F): 98 Respiratory Rate 18 (breaths/min): Photos: [N/A:N/A] Wound Location: Sacrum - Midline N/A N/A Wounding Event: Pressure Injury N/A N/A Primary Etiology: Pressure Ulcer N/A N/A Date Acquired: 03/20/2016 N/A N/A Weeks of Treatment: 0 N/A N/A Wound Status: Open N/A N/A Measurements L x W x D 0.8x1x0.2 N/A N/A (cm) Area (cm) : 0.628 N/A N/A Volume (cm) : 0.126 N/A N/A Classification: Category/Stage III N/A N/A Exudate Amount: Large N/A N/A Exudate Type: Serous N/A N/A Exudate Color: amber N/A N/A Wound Margin: Flat and Intact N/A N/A Granulation Amount: None Present (0%) N/A N/A Necrotic Amount: Large (67-100%) N/A N/A Necrotic Tissue: Eschar, Adherent Slough N/A N/A Exposed Structures: Fascia: No N/A N/A Fat: No Tendon: No Muscle: No Joint: No Geissinger, Jaeleen ([id number]) Bone: No Limited to Skin Breakdown Epithelialization: None N/A N/A Periwound Skin Texture: Edema: No N/A N/A Excoriation: No Induration: No Callus: No Crepitus: No Fluctuance: No Friable: No Rash: No Scarring: No Periwound Skin Moist: Yes N/A  N/A Moisture: Maceration: No Dry/Scaly: No Periwound Skin Color: Erythema: Yes N/A N/A Atrophie Blanche: No Cyanosis: No Ecchymosis: No Hemosiderin Staining: No Mottled: No Pallor: No Rubor: No Erythema Location: Circumferential N/A N/A Temperature: No Abnormality N/A N/A Tenderness on Yes N/A N/A Palpation: Wound Preparation: Ulcer Cleansing: N/A N/A Rinsed/Irrigated with Saline Topical Anesthetic Applied: Other: lidocaine 45 Treatment Notes Electronic Signature(s) Signed: 04/03/2016 9:23:04 AM By: Curtis Sites Entered By: Curtis Sites on 04/03/2016 09:23:04 VERBIE, BABIC ([id number]) -------------------------------------------------------------------------------- Multi-Disciplinary Care Plan Details Patient Name: Tara Tara Frederick Date of Service: 04/03/2016 8:45 AM Medical Record Number: [id number] Patient Account Number: [id number] Date of Birth/Sex: [birth year] (82 y.o. Female) Treating RN: Curtis Sites Primary Care Physician: SYSTEM, PCP Other Clinician: Referring Physician: Treating Physician/Extender: Rudene Re in Treatment: 0 Active Inactive Abuse / Safety / Falls / Self Care Management Nursing Diagnoses: Impaired physical mobility Potential for falls  Goals: Patient will remain injury free Date Initiated: 04/03/2016 Goal Status: Active Interventions: Assess fall risk on admission and as needed Notes: Orientation to the Wound Care Program Nursing Diagnoses: Knowledge deficit related to the wound healing center program Goals: Patient/caregiver will verbalize understanding of the Wound Healing Center Program Date Initiated: 04/03/2016 Goal Status: Active Interventions: Provide education on orientation to the wound center Notes: Pressure Nursing Diagnoses: Knowledge deficit related to management of pressures ulcers Goals: Patient will remain free from development of additional pressure ulcers GEISHA, ABERNATHY ([id number]) Date Initiated:  04/03/2016 Goal Status: Active Interventions: Assess: immobility, friction, shearing, incontinence upon admission and as needed Provide education on pressure ulcers Notes: Wound/Skin Impairment Nursing Diagnoses: Impaired tissue integrity Goals: Patient/caregiver will verbalize understanding of skin care regimen Date Initiated: 04/03/2016 Goal Status: Active Ulcer/skin breakdown will have a volume reduction of 30% by week 4 Date Initiated: 04/03/2016 Goal Status: Active Ulcer/skin breakdown will have a volume reduction of 50% by week 8 Date Initiated: 04/03/2016 Goal Status: Active Ulcer/skin breakdown will have a volume reduction of 80% by week 12 Date Initiated: 04/03/2016 Goal Status: Active Ulcer/skin breakdown will heal within 14 weeks Date Initiated: 04/03/2016 Goal Status: Active Interventions: Assess patient/caregiver ability to obtain necessary supplies Assess patient/caregiver ability to perform ulcer/skin care regimen upon admission and as needed Assess ulceration(s) every visit Notes: Electronic Signature(s) Signed: 04/03/2016 9:22:55 AM By: Curtis Sites Entered By: Curtis Sites on 04/03/2016 09:22:55 Tara Tara Frederick ([id number]) -------------------------------------------------------------------------------- Pain Assessment Details Patient Name: Tara Tara Frederick Date of Service: 04/03/2016 8:45 AM Medical Record Number: [id number] Patient Account Number: [id number] Date of Birth/Sex: 1924/04/19 (82 y.o. Female) Treating RN: Curtis Sites Primary Care Physician: SYSTEM, PCP Other Clinician: Referring Physician: Treating Physician/Extender: Rudene Re in Treatment: 0 Active Problems Location of Pain Severity and Description of Pain Patient Has Paino Yes Site Locations Pain Location: Generalized Pain Pain Management and Medication Current Pain Management: Notes Topical or injectable lidocaine is offered to patient for acute pain when surgical  debridement is performed. If needed, Patient is instructed to use over the counter pain medication for the following 24-48 hours after debridement. Wound care MDs do not prescribed pain medications. Patient has chronic pain or uncontrolled pain. Patient has been instructed to make an appointment with their Primary Care Physician for pain management. Electronic Signature(s) Signed: 04/03/2016 4:44:46 PM By: Curtis Sites Entered By: Curtis Sites on 04/03/2016 09:08:42 Tara Tara Frederick ([id number]) -------------------------------------------------------------------------------- Patient/Caregiver Education Details Patient Name: Tara Tara Frederick Date of Service: 04/03/2016 8:45 AM Medical Record Number: [id number] Patient Account Number: [id number] Date of Birth/Gender: 1924-03-08 (82 y.o. Female) Treating RN: Curtis Sites Primary Care Physician: SYSTEM, PCP Other Clinician: Referring Physician: Treating Physician/Extender: Rudene Re in Treatment: 0 Education Assessment Education Provided To: Patient and Caregiver Education Topics Provided Pressure: Handouts: Pressure Ulcers: Care and Offloading Methods: Explain/Verbal Responses: State content correctly Wound/Skin Impairment: Handouts: Other: wound care as ordered Methods: Demonstration, Explain/Verbal Responses: State content correctly Electronic Signature(s) Signed: 04/03/2016 4:44:46 PM By: Curtis Sites Entered By: Curtis Sites on 04/03/2016 09:23:38 Tara Tara Frederick ([id number]) -------------------------------------------------------------------------------- Wound Assessment Details Patient Name: Tara Tara Frederick Date of Service: 04/03/2016 8:45 AM Medical Record Number: [id number] Patient Account Number: [id number] Date of Birth/Sex: 01-16-1924 (82 y.o. Female) Treating RN: Curtis Sites Primary Care Physician: SYSTEM, PCP Other Clinician: Referring Physician: Treating Physician/Extender: Rudene Re in  Treatment: 0 Wound Status Wound Number: 1 Primary Etiology: Pressure Ulcer Wound Location: Sacrum - Midline Wound Status: Open Wounding Event: Pressure Injury Date Acquired: 03/20/2016 Weeks Of Treatment: 0  Clustered Wound: No Photos Wound Measurements Length: (cm) 0.8 Width: (cm) 1 Depth: (cm) 0.2 Area: (cm) 0.628 Volume: (cm) 0.126 % Reduction in Area: % Reduction in Volume: Epithelialization: None Tunneling: No Undermining: No Wound Description Classification: Category/Stage III Wound Margin: Flat and Intact Exudate Amount: Large Exudate Type: Serous Exudate Color: amber Foul Odor After Cleansing: No Wound Bed Granulation Amount: None Present (0%) Exposed Structure Necrotic Amount: Large (67-100%) Fascia Exposed: No Necrotic Quality: Eschar, Adherent Slough Fat Layer Exposed: No Tendon Exposed: No Muscle Exposed: No Blossom, Merideth ([id number]) Joint Exposed: No Bone Exposed: No Limited to Skin Breakdown Periwound Skin Texture Texture Color No Abnormalities Noted: No No Abnormalities Noted: No Callus: No Atrophie Blanche: No Crepitus: No Cyanosis: No Excoriation: No Ecchymosis: No Fluctuance: No Erythema: Yes Friable: No Erythema Location: Circumferential Induration: No Hemosiderin Staining: No Localized Edema: No Mottled: No Rash: No Pallor: No Scarring: No Rubor: No Moisture Temperature / Pain No Abnormalities Noted: No Temperature: No Abnormality Dry / Scaly: No Tenderness on Palpation: Yes Maceration: No Moist: Yes Wound Preparation Ulcer Cleansing: Rinsed/Irrigated with Saline Topical Anesthetic Applied: Other: lidocaine 45, Treatment Notes Wound #1 (Midline Sacrum) 1. Cleansed with: Clean wound with Normal Saline 2. Anesthetic Topical Lidocaine 4% cream to wound bed prior to debridement 3. Peri-wound Care: Skin Prep 4. Dressing Applied: Santyl Ointment 5. Secondary Dressing Applied Bordered Foam Dressing Dry  Gauze Electronic Signature(s) Signed: 04/03/2016 4:44:46 PM By: Curtis Sites Entered By: Curtis Sites on 04/03/2016 09:21:31 DEIDREA, GAETZ ([id number]) -------------------------------------------------------------------------------- Vitals Details Patient Name: Tara Tara Frederick Date of Service: 04/03/2016 8:45 AM Medical Record Number: [id number] Patient Account Number: [id number] Date of Birth/Sex: Sep 02, 1923 (82 y.o. Female) Treating RN: Curtis Sites Primary Care Physician: SYSTEM, PCP Other Clinician: Referring Physician: Treating Physician/Extender: Rudene Re in Treatment: 0 Vital Signs Time Taken: 09:09 Temperature (F): 98 Height (in): 61 Pulse (bpm): 98 Source: Stated Respiratory Rate (breaths/min): 18 Weight (lbs): 113 Blood Pressure (mmHg): 117/64 Source: Stated Reference Range: 80 - 120 mg / dl Body Mass Index (BMI): 21.3 Electronic Signature(s) Signed: 04/03/2016 4:44:46 PM By: Curtis Sites Entered By: Curtis Sites on 04/03/2016 09:09:17

## 2016-04-14 ENCOUNTER — Other Ambulatory Visit (HOSPITAL_COMMUNITY): Payer: Self-pay | Admitting: Interventional Radiology

## 2016-04-14 DIAGNOSIS — IMO0002 Reserved for concepts with insufficient information to code with codable children: Secondary | ICD-10-CM

## 2016-04-17 ENCOUNTER — Encounter: Payer: No Typology Code available for payment source | Attending: Surgery | Admitting: Surgery

## 2016-04-17 DIAGNOSIS — M1990 Unspecified osteoarthritis, unspecified site: Secondary | ICD-10-CM | POA: Diagnosis not present

## 2016-04-17 DIAGNOSIS — L89153 Pressure ulcer of sacral region, stage 3: Secondary | ICD-10-CM | POA: Insufficient documentation

## 2016-04-17 DIAGNOSIS — J45909 Unspecified asthma, uncomplicated: Secondary | ICD-10-CM | POA: Insufficient documentation

## 2016-04-17 DIAGNOSIS — I10 Essential (primary) hypertension: Secondary | ICD-10-CM | POA: Insufficient documentation

## 2016-04-17 DIAGNOSIS — K219 Gastro-esophageal reflux disease without esophagitis: Secondary | ICD-10-CM | POA: Insufficient documentation

## 2016-04-17 DIAGNOSIS — Z880 Allergy status to penicillin: Secondary | ICD-10-CM | POA: Diagnosis not present

## 2016-04-17 DIAGNOSIS — S32010D Wedge compression fracture of first lumbar vertebra, subsequent encounter for fracture with routine healing: Secondary | ICD-10-CM | POA: Diagnosis not present

## 2016-04-17 DIAGNOSIS — Z79899 Other long term (current) drug therapy: Secondary | ICD-10-CM | POA: Diagnosis not present

## 2016-04-17 DIAGNOSIS — X58XXXD Exposure to other specified factors, subsequent encounter: Secondary | ICD-10-CM | POA: Insufficient documentation

## 2016-04-17 NOTE — Progress Notes (Signed)
Tara Frederick, [place] ([id number]) Visit Report for 04/17/2016 Chief Complaint Document Details Patient Name: Tara Frederick, Tara Frederick Date of Service: 04/17/2016 9:15 AM Medical Record Number: [id number] Patient Account Number: [id number] Date of Birth/Sex: [birth year] (82 y.o. Female) Treating RN: Curtis Sitesorthy, Joanna Primary Care Physician: SYSTEM, PCP Other Clinician: Referring Physician: Julieanne CottonEVESHWAR, SANJEEV Treating Physician/Extender: Rudene ReBritto, Ernestine Rohman Weeks in Treatment: 2 Information Obtained from: Patient Chief Complaint Patient is at the clinic for treatment of an open pressure ulcer to the sacral region for about 2 weeks Electronic Signature(s) Signed: 04/17/2016 9:51:26 AM By: Evlyn KannerBritto, Tajha Sammarco MD, FACS Entered By: Evlyn KannerBritto, Elsbeth Yearick on 04/17/2016 09:51:26 Tara Frederick, Tara Frederick ([id number]) -------------------------------------------------------------------------------- Debridement Details Patient Name: Tara Frederick, Tara Frederick Date of Service: 04/17/2016 9:15 AM Medical Record Number: [id number] Patient Account Number: [id number] Date of Birth/Sex: [birth year] (82 y.o. Female) Treating RN: Curtis Sitesorthy, Joanna Primary Care Physician: SYSTEM, PCP Other Clinician: Referring Physician: Julieanne CottonEVESHWAR, SANJEEV Treating Physician/Extender: Rudene ReBritto, Rio Taber Weeks in Treatment: 2 Debridement Performed for Wound #1 Midline Sacrum Assessment: Performed By: Physician Evlyn KannerBritto, Delia Slatten, MD Debridement: Debridement Pre-procedure Yes - 09:47 Verification/Time Out Taken: Start Time: 09:48 Pain Control: Lidocaine 4% Topical Solution Level: Skin/Subcutaneous Tissue Total Area Debrided (L x 0.7 (cm) x 0.8 (cm) = 0.56 (cm) W): Tissue and other Viable, Non-Viable, Fibrin/Slough, Subcutaneous material debrided: Instrument: Curette Bleeding: Minimum Hemostasis Achieved: Pressure End Time: 09:49 Procedural Pain: 0 Post Procedural Pain: 0 Response to Treatment: Procedure was tolerated well Post Debridement Measurements of Total Wound Length: (cm)  0.7 Stage: Category/Stage III Width: (cm) 0.8 Depth: (cm) 0.2 Volume: (cm) 0.088 Character of Wound/Ulcer Post Requires Further Debridement: Debridement Severity of Tissue Post Fat layer exposed Debridement: Post Procedure Diagnosis Same as Pre-procedure Electronic Signature(s) Signed: 04/17/2016 9:51:12 AM By: Evlyn KannerBritto, Daniyal Tabor MD, FACS Signed: 04/17/2016 4:43:32 PM By: Curtis Sitesorthy, Joanna Entered By: Evlyn KannerBritto, Doris Mcgilvery on 04/17/2016 09:51:12 Tara Frederick, Tara Frederick ([id number]) Tara Frederick, Tara Frederick ([id number]) -------------------------------------------------------------------------------- HPI Details Patient Name: Tara Frederick, Tara Frederick Date of Service: 04/17/2016 9:15 AM Medical Record Number: [id number] Patient Account Number: [id number] Date of Birth/Sex: [birth year] (82 y.o. Female) Treating RN: Curtis Sitesorthy, Joanna Primary Care Physician: SYSTEM, PCP Other Clinician: Referring Physician: Julieanne CottonEVESHWAR, SANJEEV Treating Physician/Extender: Rudene ReBritto, Alletta Mattos Weeks in Treatment: 2 History of Present Illness Location: sacral region Quality: Patient reports experiencing a sharp pain to affected area(s). Severity: Patient states wound are getting worse. Duration: Patient has had the wound for < 2 weeks prior to presenting for treatment Timing: Pain in wound is Intermittent (comes and goes Context: The wound would happen gradually Modifying Factors: Other treatment(s) tried include:since L1 compression fracturesurgery for a Associated Signs and Symptoms: Patient reports having difficulty standing for long periods. HPI Description: 82 year old patient who [facility] was discharged on August 10. she was treated for severe constipation, L1 compression fracture and is status post kyphoplasty on 8/8. also treated for a UTI with Levaquin and continued on supportive care. Past medical history significant for hypertension, GERD, degenerative disc disease. Status post appendectomy,  hemorrhoid surgery and a total abdominal hysterectomy with bilateral salpingo- oophorectomy. Present she is in a rehabilitation facility and ambulating with a walker. Her protein intake is said to be good and she is on vitamin supplements. She has an appropriate bed surface. Electronic Signature(s) Signed: 04/17/2016 9:51:29 AM By: Evlyn KannerBritto, Mariame Rybolt MD, FACS Entered By: Evlyn KannerBritto, Moataz Tavis on 04/17/2016 09:51:29 Tara Frederick, Tara Frederick ([id number]) -------------------------------------------------------------------------------- Physical Exam Details Patient Name: Tara Frederick, Tara Frederick Date of Service: 04/17/2016 9:15 AM Medical Record Number: [id number] Patient Account Number: [id number] Date of Birth/Sex: [birth year] (82 y.o. Female) Treating RN: Dorthy,  Mardene Celeste Primary Care Physician: SYSTEM, PCP Other Clinician: Referring Physician: Julieanne Cotton Treating Physician/Extender: Rudene Re in Treatment: 2 Constitutional . Pulse regular. Respirations normal and unlabored. Afebrile. . Eyes Nonicteric. Reactive to light. Ears, Nose, Mouth, and Throat Lips, teeth, and gums WNL.Marland Kitchen Moist mucosa without lesions. Neck supple and nontender. No palpable supraclavicular or cervical adenopathy. Normal sized without goiter. Respiratory WNL. No retractions.. Cardiovascular Pedal Pulses WNL. No clubbing, cyanosis or edema. Lymphatic No adneopathy. No adenopathy. No adenopathy. Musculoskeletal Adexa without tenderness or enlargement.. Digits and nails w/o clubbing, cyanosis, infection, petechiae, ischemia, or inflammatory conditions.. Integumentary (Hair, Skin) No suspicious lesions. No crepitus or fluctuance. No peri-wound warmth or erythema. No masses.Marland Kitchen Psychiatric Judgement and insight Intact.. No evidence of depression, anxiety, or agitation.. Notes he also continues to have minimal subcutaneous debris which I sharply removed with a #3 curet and minimal bleeding controlled with pressure Electronic  Signature(s) Signed: 04/17/2016 9:52:33 AM By: Evlyn Kanner MD, FACS Entered By: Evlyn Kanner on 04/17/2016 09:52:33 Tara Frederick ([id number]) -------------------------------------------------------------------------------- Physician Orders Details Patient Name: Tara Frederick Date of Service: 04/17/2016 9:15 AM Medical Record Number: [id number] Patient Account Number: [id number] Date of Birth/Sex: Feb 28, 1924 (82 y.o. Female) Treating RN: Curtis Sites Primary Care Physician: SYSTEM, PCP Other Clinician: Referring Physician: Julieanne Cotton Treating Physician/Extender: Rudene Re in Treatment: 2 Verbal / Phone Orders: Yes Clinician: Curtis Sites Read Back and Verified: Yes Diagnosis Coding Wound Cleansing Wound #1 Midline Sacrum o Clean wound with Normal Saline. Anesthetic Wound #1 Midline Sacrum o Topical Lidocaine 4% cream applied to wound bed prior to debridement Skin Barriers/Peri-Wound Care Wound #1 Midline Sacrum o Skin Prep Primary Wound Dressing Wound #1 Midline Sacrum o Santyl Ointment Secondary Dressing Wound #1 Midline Sacrum o Dry Gauze o Boardered Foam Dressing Dressing Change Frequency Wound #1 Midline Sacrum o Change dressing every day. Follow-up Appointments Wound #1 Midline Sacrum o Return Appointment in 1 week. Off-Loading Wound #1 Midline Sacrum o Turn and reposition every 2 hours Additional Orders / Instructions Tara Frederick, Tara Frederick ([id number]) Wound #1 Midline Sacrum o Increase protein intake. Medications-please add to medication list. Wound #1 Midline Sacrum o Other: - Please add vitamin C and zinc and vitamin A to patient's medications daily Electronic Signature(s) Signed: 04/17/2016 4:17:39 PM By: Evlyn Kanner MD, FACS Signed: 04/17/2016 4:43:32 PM By: Curtis Sites Entered By: Curtis Sites on 04/17/2016 09:50:38 Tara Frederick  ([id number]) -------------------------------------------------------------------------------- Problem List Details Patient Name: Tara Frederick Date of Service: 04/17/2016 9:15 AM Medical Record Number: [id number] Patient Account Number: [id number] Date of Birth/Sex: Aug 20, 1923 (82 y.o. Female) Treating RN: Curtis Sites Primary Care Physician: SYSTEM, PCP Other Clinician: Referring Physician: Julieanne Cotton Treating Physician/Extender: Rudene Re in Treatment: 2 Active Problems ICD-10 Encounter Code Description Active Date Diagnosis L89.153 Pressure ulcer of sacral region, stage 3 04/03/2016 Yes S32.010D Wedge compression fracture of first lumbar vertebra, 04/03/2016 Yes subsequent encounter for fracture with routine healing Inactive Problems Resolved Problems Electronic Signature(s) Signed: 04/17/2016 9:51:03 AM By: Evlyn Kanner MD, FACS Entered By: Evlyn Kanner on 04/17/2016 09:51:03 Tara Frederick ([id number]) -------------------------------------------------------------------------------- Progress Note Details Patient Name: Tara Frederick Date of Service: 04/17/2016 9:15 AM Medical Record Number: [id number] Patient Account Number: [id number] Date of Birth/Sex: 04-Jan-1924 (82 y.o. Female) Treating RN: Curtis Sites Primary Care Physician: SYSTEM, PCP Other Clinician: Referring Physician: Julieanne Cotton Treating Physician/Extender: Rudene Re in Treatment: 2 Subjective Chief Complaint Information obtained from Patient Patient is at the clinic for treatment of an open pressure ulcer to the sacral region for about 2  weeks History of Present Illness (HPI) The following HPI elements were documented for the patient's wound: Location: sacral region Quality: Patient reports experiencing a sharp pain to affected area(s). Severity: Patient states wound are getting worse. Duration: Patient has had the wound for < 2 weeks prior to presenting for  treatment Timing: Pain in wound is Intermittent (comes and goes Context: The wound would happen gradually Modifying Factors: Other treatment(s) tried include:since L1 compression fracturesurgery for a Associated Signs and Symptoms: Patient reports having difficulty standing for long periods. 82 year old patient who was recently admitted to [facility] on July 28 was discharged on August 10. she was treated for severe constipation, L1 compression fracture and is status post kyphoplasty on 8/8. also treated for a UTI with Levaquin and continued on supportive care. Past medical history significant for hypertension, GERD, degenerative disc disease. Status post appendectomy, hemorrhoid surgery and a total abdominal hysterectomy with bilateral salpingo- oophorectomy. Present she is in a rehabilitation facility and ambulating with a walker. Her protein intake is said to be good and she is on vitamin supplements. She has an appropriate bed surface. Objective Constitutional Pulse regular. Respirations normal and unlabored. Afebrile. Vitals Time Taken: 9:39 AM, Height: 61 in, Weight: 113 lbs, BMI: 21.3, Temperature: 98.1 F, Pulse: 92 bpm, Respiratory Rate: 16 breaths/min, Blood Pressure: 117/80 mmHg. Tara Frederick, LORENZI ([id number]) Eyes Nonicteric. Reactive to light. Ears, Nose, Mouth, and Throat Lips, teeth, and gums WNL.Marland Kitchen Moist mucosa without lesions. Neck supple and nontender. No palpable supraclavicular or cervical adenopathy. Normal sized without goiter. Respiratory WNL. No retractions.. Cardiovascular Pedal Pulses WNL. No clubbing, cyanosis or edema. Lymphatic No adneopathy. No adenopathy. No adenopathy. Musculoskeletal Adexa without tenderness or enlargement.. Digits and nails w/o clubbing, cyanosis, infection, petechiae, ischemia, or inflammatory conditions.Marland Kitchen Psychiatric Judgement and insight Intact.. No evidence of depression, anxiety, or agitation.. General Notes: he also  continues to have minimal subcutaneous debris which I sharply removed with a #3 curet and minimal bleeding controlled with pressure Integumentary (Hair, Skin) No suspicious lesions. No crepitus or fluctuance. No peri-wound warmth or erythema. No masses.. Wound #1 status is Open. Original cause of wound was Pressure Injury. The wound is located on the Midline Sacrum. The wound measures 0.7cm length x 0.8cm width x 0.2cm depth; 0.44cm^2 area and 0.088cm^3 volume. The wound is limited to skin breakdown. There is no tunneling or undermining noted. There is a large amount of serous drainage noted. The wound margin is flat and intact. There is medium (34-66%) pink granulation within the wound bed. There is a medium (34-66%) amount of necrotic tissue within the wound bed including Adherent Slough. The periwound skin appearance exhibited: Moist, Erythema. The periwound skin appearance did not exhibit: Callus, Crepitus, Excoriation, Fluctuance, Friable, Induration, Localized Edema, Rash, Scarring, Dry/Scaly, Maceration, Atrophie Blanche, Cyanosis, Ecchymosis, Hemosiderin Staining, Mottled, Pallor, Rubor. The surrounding wound skin color is noted with erythema which is circumferential. Periwound temperature was noted as No Abnormality. The periwound has tenderness on palpation. Assessment DENECIA, BRUNETTE ([id number]) Active Problems ICD-10 L89.153 - Pressure ulcer of sacral region, stage 3 S32.010D - Wedge compression fracture of first lumbar vertebra, subsequent encounter for fracture with routine healing Procedures Wound #1 Wound #1 is a Pressure Ulcer located on the Midline Sacrum . There was a Skin/Subcutaneous Tissue Debridement ([id number]) debridement with total area of 0.56 sq cm performed by Evlyn Kanner, MD. with the following instrument(s): Curette to remove Viable and Non-Viable tissue/material including Fibrin/Slough and Subcutaneous after achieving pain control using Lidocaine 4%  Topical Solution.  A time out was conducted at 09:47, prior to the start of the procedure. A Minimum amount of bleeding was controlled with Pressure. The procedure was tolerated well with a pain level of 0 throughout and a pain level of 0 following the procedure. Post Debridement Measurements: 0.7cm length x 0.8cm width x 0.2cm depth; 0.088cm^3 volume. Post debridement Stage noted as Category/Stage III. Character of Wound/Ulcer Post Debridement requires further debridement. Severity of Tissue Post Debridement is: Fat layer exposed. Post procedure Diagnosis Wound #1: Same as Pre-Procedure Plan Wound Cleansing: Wound #1 Midline Sacrum: Clean wound with Normal Saline. Anesthetic: Wound #1 Midline Sacrum: Topical Lidocaine 4% cream applied to wound bed prior to debridement Skin Barriers/Peri-Wound Care: Wound #1 Midline Sacrum: Skin Prep Primary Wound Dressing: Wound #1 Midline Sacrum: Santyl Ointment Secondary Dressing: Wound #1 Midline Sacrum: Dry Gauze Boardered Foam Dressing Gumina, Heavyn ([id number]) Dressing Change Frequency: Wound #1 Midline Sacrum: Change dressing every day. Follow-up Appointments: Wound #1 Midline Sacrum: Return Appointment in 1 week. Off-Loading: Wound #1 Midline Sacrum: Turn and reposition every 2 hours Additional Orders / Instructions: Wound #1 Midline Sacrum: Increase protein intake. Medications-please add to medication list.: Wound #1 Midline Sacrum: Other: - Please add vitamin C and zinc and vitamin A to patient's medications daily I have recommended: 1. Santyl ointment to be applied daily with a bordered foam. 2. sent off loading and laying on a proper mattress appropriate for her care 3. Appropriate amount of proteins and vitamins A, C and zinc 4. Regular visits to the wound center She and her daughter at the bedside have had all questions answered. Electronic Signature(s) Signed: 04/17/2016 9:53:15 AM By: Evlyn Kanner MD, FACS Entered By:  Evlyn Kanner on 04/17/2016 09:53:15 DARRIS, CARACHURE ([id number]) -------------------------------------------------------------------------------- SuperBill Details Patient Name: Tara Frederick Date of Service: 04/17/2016 Medical Record Number: [id number] Patient Account Number: [id number] Date of Birth/Sex: August 10, 1923 (82 y.o. Female) Treating RN: Curtis Sites Primary Care Physician: SYSTEM, PCP Other Clinician: Referring Physician: Julieanne Cotton Treating Physician/Extender: Rudene Re in Treatment: 2 Diagnosis Coding ICD-10 Codes Code Description [phone] Pressure ulcer of sacral region, stage 3 Wedge compression fracture of first lumbar vertebra, subsequent encounter for fracture S32.010D with routine healing Facility Procedures CPT4: Description Modifier Quantity Code [id number] 11042 - DEB SUBQ TISSUE 20 SQ CM/< 1 ICD-10 Description Diagnosis L89.153 Pressure ulcer of sacral region, stage 3 S32.010D Wedge compression fracture of first lumbar vertebra, subsequent encounter for  fracture with routine healing Physician Procedures CPT4: Description Modifier Quantity Code [id number] 11042 - WC PHYS SUBQ TISS 20 SQ CM 1 ICD-10 Description Diagnosis L89.153 Pressure ulcer of sacral region, stage 3 S32.010D Wedge compression fracture of first lumbar vertebra, subsequent encounter for  fracture with routine healing Electronic Signature(s) Signed: 04/17/2016 10:00:16 AM By: Evlyn Kanner MD, FACS Entered By: Evlyn Kanner on 04/17/2016 10:00:15

## 2016-04-17 NOTE — Progress Notes (Signed)
Tara, Frederick ([id number]) Visit Report for 04/17/2016 Arrival Information Details Patient Name: Tara Frederick, Tara Frederick Date of Service: 04/17/2016 9:15 AM Medical Record Number: [id number] Patient Account Number: [id number] Date of Birth/Sex: 1924-02-04 (82 y.o. Female) Treating RN: Tara Frederick Primary Care Physician: SYSTEM, PCP Other Clinician: Referring Physician: Julieanne Frederick Treating Physician/Extender: Tara Frederick in Treatment: 2 Visit Information History Since Last Visit Added or deleted any medications: No Patient Arrived: Walker Any new allergies or adverse reactions: No Arrival Time: 09:37 Had a fall or experienced change in No Accompanied By: dtr activities of daily living that may affect Transfer Assistance: None risk of falls: Patient Identification Verified: Yes Signs or symptoms of abuse/neglect since last No Secondary Verification Process Completed: Yes visito Hospitalized since last visit: No Pain Present Now: Yes Electronic Signature(s) Signed: 04/17/2016 4:43:32 PM By: Tara Frederick Entered By: Tara Frederick on 04/17/2016 09:37:49 Tara Frederick ([id number]) -------------------------------------------------------------------------------- Encounter Discharge Information Details Patient Name: Tara Frederick Date of Service: 04/17/2016 9:15 AM Medical Record Number: [id number] Patient Account Number: [id number] Date of Birth/Sex: [birth year] (82 y.o. Female) Treating RN: Tara Frederick Primary Care Physician: SYSTEM, PCP Other Clinician: Referring Physician: Julieanne Frederick Treating Physician/Extender: Tara Frederick in Treatment: 2 Encounter Discharge Information Items Discharge Pain Level: 0 Discharge Condition: Stable Ambulatory Status: Walker Discharge Destination: Home Transportation: Private Auto Accompanied By: dtr Schedule Follow-up Appointment: Yes Medication Reconciliation completed No and provided to Patient/Care  Tara Frederick: Provided on Clinical Summary of Care: 04/17/2016 Form Type Recipient Paper Patient FH Electronic Signature(s) Signed: 04/17/2016 9:59:51 AM By: Tara Frederick Entered By: Tara Frederick on 04/17/2016 09:59:51 Tara Frederick ([id number]) -------------------------------------------------------------------------------- Multi Wound Chart Details Patient Name: Tara Frederick Date of Service: 04/17/2016 9:15 AM Medical Record Number: [id number] Patient Account Number: [id number] Date of Birth/Sex: 1924-04-22 (82 y.o. Female) Treating RN: Tara Frederick Primary Care Physician: SYSTEM, PCP Other Clinician: Referring Physician: Julieanne Frederick Treating Physician/Extender: Tara Frederick in Treatment: 2 Vital Signs Height(in): 61 Pulse(bpm): 92 Weight(lbs): 113 Blood Pressure 117/80 (mmHg): Body Mass Index(BMI): 21 Temperature(F): 98.1 Respiratory Rate 16 (breaths/min): Photos: [N/A:N/A] Wound Location: Sacrum - Midline N/A N/A Wounding Event: Pressure Injury N/A N/A Primary Etiology: Pressure Ulcer N/A N/A Comorbid History: Cataracts, Glaucoma, N/A N/A Asthma, Hypertension, Osteoarthritis Date Acquired: 03/20/2016 N/A N/A Weeks of Treatment: 2 N/A N/A Wound Status: Open N/A N/A Measurements L x W x D 0.7x0.8x0.2 N/A N/A (cm) Area (cm) : 0.44 N/A N/A Volume (cm) : 0.088 N/A N/A % Reduction in Area: 29.90% N/A N/A % Reduction in Volume: 30.20% N/A N/A Classification: Category/Stage III N/A N/A Exudate Amount: Large N/A N/A Exudate Type: Serous N/A N/A Exudate Color: amber N/A N/A Wound Margin: Flat and Intact N/A N/A Granulation Amount: Large (67-100%) N/A N/A Granulation Quality: Pink N/A N/A Necrotic Amount: Small (1-33%) N/A N/A Kotz, Tara Frederick ([id number]) Exposed Structures: Fascia: No N/A N/A Fat: No Tendon: No Muscle: No Joint: No Bone: No Limited to Skin Breakdown Epithelialization: None N/A N/A Periwound Skin Texture: Edema: No N/A  N/A Excoriation: No Induration: No Callus: No Crepitus: No Fluctuance: No Friable: No Rash: No Scarring: No Periwound Skin Moist: Yes N/A N/A Moisture: Maceration: No Dry/Scaly: No Periwound Skin Color: Erythema: Yes N/A N/A Atrophie Blanche: No Cyanosis: No Ecchymosis: No Hemosiderin Staining: No Mottled: No Pallor: No Rubor: No Erythema Location: Circumferential N/A N/A Temperature: No Abnormality N/A N/A Tenderness on Yes N/A N/A Palpation: Wound Preparation: Ulcer Cleansing: N/A N/A Rinsed/Irrigated with Saline Topical Anesthetic Applied: Other: lidocaine 4% Treatment Notes Electronic Signature(s) Signed: 04/17/2016 4:43:32 PM  By: Tara Sitesorthy, Tara Frederick on 04/17/2016 09:49:04 Tara Frederick ([id number]) -------------------------------------------------------------------------------- Multi-Disciplinary Care Plan Details Patient Name: Tara Frederick Date of Service: 04/17/2016 9:15 AM Medical Record Number: [id number] Patient Account Number: [id number] Date of Birth/Sex: November 06, 1923 (82 y.o. Female) Treating RN: Tara Sitesorthy, Tara Primary Care Physician: SYSTEM, PCP Other Clinician: Referring Physician: Julieanne CottonEVESHWAR, SANJEEV Treating Physician/Extender: Tara ReBritto, Errol Weeks in Treatment: 2 Active Inactive Abuse / Safety / Falls / Self Care Management Nursing Diagnoses: Impaired physical mobility Potential for falls Goals: Patient will remain injury free Date Initiated: 04/03/2016 Goal Status: Active Interventions: Assess fall risk on admission and as needed Notes: Orientation to the Wound Care Program Nursing Diagnoses: Knowledge deficit related to the wound healing center program Goals: Patient/caregiver will verbalize understanding of the Wound Healing Center Program Date Initiated: 04/03/2016 Goal Status: Active Interventions: Provide education on orientation to the wound center Notes: Pressure Nursing Diagnoses: Knowledge deficit  related to management of pressures ulcers Goals: Patient will remain free from development of additional pressure ulcers Tara Frederick ([id number]) Date Initiated: 04/03/2016 Goal Status: Active Interventions: Assess: immobility, friction, shearing, incontinence upon admission and as needed Provide education on pressure ulcers Notes: Wound/Skin Impairment Nursing Diagnoses: Impaired tissue integrity Goals: Patient/caregiver will verbalize understanding of skin care regimen Date Initiated: 04/03/2016 Goal Status: Active Ulcer/skin breakdown will have a volume reduction of 30% by week 4 Date Initiated: 04/03/2016 Goal Status: Active Ulcer/skin breakdown will have a volume reduction of 50% by week 8 Date Initiated: 04/03/2016 Goal Status: Active Ulcer/skin breakdown will have a volume reduction of 80% by week 12 Date Initiated: 04/03/2016 Goal Status: Active Ulcer/skin breakdown will heal within 14 weeks Date Initiated: 04/03/2016 Goal Status: Active Interventions: Assess patient/caregiver ability to obtain necessary supplies Assess patient/caregiver ability to perform ulcer/skin care regimen upon admission and as needed Assess ulceration(s) every visit Notes: Electronic Signature(s) Signed: 04/17/2016 4:43:32 PM By: Tara Sitesorthy, Tara Frederick on 04/17/2016 09:44:48 Tara Frederick ([id number]) -------------------------------------------------------------------------------- Pain Assessment Details Patient Name: Tara Frederick Date of Service: 04/17/2016 9:15 AM Medical Record Number: [id number] Patient Account Number: [id number] Date of Birth/Sex: November 06, 1923 (82 y.o. Female) Treating RN: Tara Sitesorthy, Tara Primary Care Physician: SYSTEM, PCP Other Clinician: Referring Physician: Julieanne CottonEVESHWAR, SANJEEV Treating Physician/Extender: Tara ReBritto, Errol Weeks in Treatment: 2 Active Problems Location of Pain Severity and Description of Pain Patient Has Paino Yes Site Locations Pain  Location: Pain in Ulcers With Dressing Change: Yes Duration of the Pain. Constant / Intermittento Constant Pain Management and Medication Current Pain Management: Notes Topical or injectable lidocaine is offered to patient for acute pain when surgical debridement is performed. If needed, Patient is instructed to use over the counter pain medication for the following 24-48 hours after debridement. Wound care MDs do not prescribed pain medications. Patient has chronic pain or uncontrolled pain. Patient has been instructed to make an appointment with their Primary Care Physician for pain management. Electronic Signature(s) Signed: 04/17/2016 4:43:32 PM By: Tara Sitesorthy, Tara Frederick on 04/17/2016 09:38:03 Tara Frederick ([id number]) -------------------------------------------------------------------------------- Patient/Caregiver Education Details Patient Name: Tara Frederick Date of Service: 04/17/2016 9:15 AM Medical Record Number: [id number] Patient Account Number: [id number] Date of Birth/Gender: November 06, 1923 (82 y.o. Female) Treating RN: Tara Sitesorthy, Tara Primary Care Physician: SYSTEM, PCP Other Clinician: Referring Physician: Julieanne CottonEVESHWAR, SANJEEV Treating Physician/Extender: Tara ReBritto, Errol Weeks in Treatment: 2 Education Assessment Education Provided To: Patient and Caregiver Education Topics Provided Pressure: Handouts: Preventing Pressure Ulcers Methods: Explain/Verbal Responses: State content correctly Electronic Signature(s) Signed: 04/17/2016 4:43:32 PM By: Tara Sitesorthy, Tara Frederick  on 04/17/2016 09:58:51 Tara Frederick, Tara Frederick ([id number]) -------------------------------------------------------------------------------- Wound Assessment Details Patient Name: Tara Frederick, Tara Frederick Date of Service: 04/17/2016 9:15 AM Medical Record Number: [id number] Patient Account Number: [id number] Date of Birth/Sex: November 26, 1923 (82 y.o. Female) Treating RN: Tara Frederick Primary Care Physician: SYSTEM, PCP Other Clinician: Referring Physician: Julieanne Frederick Treating Physician/Extender: Tara Frederick in Treatment: 2 Wound Status Wound Number: 1 Primary Pressure Ulcer Etiology: Wound Location: Sacrum - Midline Wound Open Wounding Event: Pressure Injury Status: Date Acquired: 03/20/2016 Comorbid Cataracts, Glaucoma, Asthma, Weeks Of Treatment: 2 History: Hypertension, Osteoarthritis Clustered Wound: No Photos Wound Measurements Length: (cm) 0.7 Width: (cm) 0.8 Depth: (cm) 0.2 Area: (cm) 0.44 Volume: (cm) 0.088 % Reduction in Area: 29.9% % Reduction in Volume: 30.2% Epithelialization: None Tunneling: No Undermining: No Wound Description Classification: Category/Stage III Wound Margin: Flat and Intact Exudate Amount: Large Exudate Type: Serous Exudate Color: amber Foul Odor After Cleansing: No Wound Bed Granulation Amount: Medium (34-66%) Exposed Structure Granulation Quality: Pink Fascia Exposed: No Necrotic Amount: Medium (34-66%) Fat Layer Exposed: No Necrotic Quality: Adherent Slough Tendon Exposed: No Muscle Exposed: No Sobek, Mellanie ([id number]) Joint Exposed: No Bone Exposed: No Limited to Skin Breakdown Periwound Skin Texture Texture Color No Abnormalities Noted: No No Abnormalities Noted: No Callus: No Atrophie Blanche: No Crepitus: No Cyanosis: No Excoriation: No Ecchymosis: No Fluctuance: No Erythema: Yes Friable: No Erythema Location: Circumferential Induration: No Hemosiderin Staining: No Localized Edema: No Mottled: No Rash: No Pallor: No Scarring: No Rubor: No Moisture Temperature / Pain No Abnormalities Noted: No Temperature: No Abnormality Dry / Scaly: No Tenderness on Palpation: Yes Maceration: No Moist: Yes Wound Preparation Ulcer Cleansing: Rinsed/Irrigated with Saline Topical Anesthetic Applied: Other: lidocaine 4%, Treatment Notes Wound #1 (Midline Sacrum) 1.  Cleansed with: Clean wound with Normal Saline 2. Anesthetic Topical Lidocaine 4% cream to wound bed prior to debridement 4. Dressing Applied: Santyl Ointment 5. Secondary Dressing Applied Bordered Foam Dressing Dry Gauze Electronic Signature(s) Signed: 04/17/2016 4:43:32 PM By: Tara Frederick Entered By: Tara Frederick on 04/17/2016 09:50:57 Tara Frederick, Tara Frederick ([id number]) -------------------------------------------------------------------------------- Vitals Details Patient Name: Tara Frederick Date of Service: 04/17/2016 9:15 AM Medical Record Number: [id number] Patient Account Number: [id number] Date of Birth/Sex: 30-Mar-1924 (82 y.o. Female) Treating RN: Tara Frederick Primary Care Physician: SYSTEM, PCP Other Clinician: Referring Physician: Julieanne Frederick Treating Physician/Extender: Tara Frederick in Treatment: 2 Vital Signs Time Taken: 09:39 Temperature (F): 98.1 Height (in): 61 Pulse (bpm): 92 Weight (lbs): 113 Respiratory Rate (breaths/min): 16 Body Mass Index (BMI): 21.3 Blood Pressure (mmHg): 117/80 Reference Range: 80 - 120 mg / dl Electronic Signature(s) Signed: 04/17/2016 4:43:32 PM By: Tara Frederick Entered By: Tara Frederick on 04/17/2016 09:39:41

## 2016-04-24 ENCOUNTER — Encounter: Payer: No Typology Code available for payment source | Admitting: Surgery

## 2016-04-24 DIAGNOSIS — L89153 Pressure ulcer of sacral region, stage 3: Secondary | ICD-10-CM | POA: Diagnosis not present

## 2016-04-25 NOTE — Progress Notes (Signed)
Tara Frederick, Marcie ([id number]) Visit Report for 04/24/2016 Arrival Information Details Patient Name: Tara Frederick, Tara Frederick Date of Service: 04/24/2016 10:00 AM Medical Record Number: [id number] Patient Account Number: [id number] Date of Birth/Sex: May 09, 1924 (82 y.o. Female) Treating RN: Tara Frederick Primary Care Physician: SYSTEM, PCP Other Clinician: Referring Physician: Julieanne CottonEVESHWAR, Frederick Treating Physician/Extender: Tara Frederick Weeks in Treatment: 3 Visit Information History Since Last Visit Added or deleted any medications: No Patient Arrived: Walker Any new allergies or adverse reactions: No Arrival Time: 10:10 Had a fall or experienced change in No Accompanied By: daughter activities of daily living that may affect Transfer Assistance: None risk of falls: Patient Identification Verified: Yes Signs or symptoms of abuse/neglect since last No Secondary Verification Process Yes visito Completed: Has Dressing in Place as Prescribed: Yes Pain Present Now: No Electronic Signature(s) Signed: 04/24/2016 4:52:58 PM By: Elliot GurneyWoody, RN, BSN, Kim RN, BSN Entered By: Elliot GurneyWoody, RN, BSN, Frederick on 04/24/2016 10:11:02 Tara Frederick, Tara Frederick ([id number]) -------------------------------------------------------------------------------- Encounter Discharge Information Details Patient Name: Tara Frederick, Tara Frederick Date of Service: 04/24/2016 10:00 AM Medical Record Number: [id number] Patient Account Number: [id number] Date of Birth/Sex: May 09, 1924 (82 y.o. Female) Treating RN: Tara Frederick Primary Care Physician: SYSTEM, PCP Other Clinician: Referring Physician: Julieanne CottonEVESHWAR, Frederick Treating Physician/Extender: Tara Frederick Weeks in Treatment: 3 Encounter Discharge Information Items Discharge Pain Level: 0 Discharge Condition: Stable Ambulatory Status: Walker Discharge Destination: Home Transportation: Private Auto Accompanied By: daughter Schedule Follow-up Appointment: Yes Medication Reconciliation completed Yes and  provided to Patient/Care Adlai Sinning: Provided on Clinical Summary of Care: 04/24/2016 Form Type Recipient Paper Patient FH Electronic Signature(s) Signed: 04/24/2016 10:33:28 AM By: Gwenlyn PerkingMoore, Shelia Entered By: Gwenlyn PerkingMoore, Shelia on 04/24/2016 10:33:27 Tara Frederick, Tara Frederick ([id number]) -------------------------------------------------------------------------------- Multi Wound Chart Details Patient Name: Tara Frederick, Tara Frederick Date of Service: 04/24/2016 10:00 AM Medical Record Number: [id number] Patient Account Number: [id number] Date of Birth/Sex: May 09, 1924 (82 y.o. Female) Treating RN: Tara Frederick Primary Care Physician: SYSTEM, PCP Other Clinician: Referring Physician: Julieanne CottonEVESHWAR, Frederick Treating Physician/Extender: Tara Frederick Weeks in Treatment: 3 Vital Signs Height(in): 61 Pulse(bpm): 79 Weight(lbs): 113 Blood Pressure 88/59 (mmHg): Body Mass Index(BMI): 21 Temperature(F): 97.8 Respiratory Rate 16 (breaths/min): Photos: [1:No Photos] [N/A:N/A] Wound Location: [1:Sacrum - Midline] [N/A:N/A] Wounding Event: [1:Pressure Injury] [N/A:N/A] Primary Etiology: [1:Pressure Ulcer] [N/A:N/A] Comorbid History: [1:Cataracts, Glaucoma, Asthma, Hypertension, Osteoarthritis] [N/A:N/A] Date Acquired: [1:03/20/2016] [N/A:N/A] Weeks of Treatment: [1:3] [N/A:N/A] Wound Status: [1:Open] [N/A:N/A] Measurements L x W x D 0.9x0.5x0.2 [N/A:N/A] (cm) Area (cm) : [1:0.353] [N/A:N/A] Volume (cm) : [1:0.071] [N/A:N/A] % Reduction in Area: [1:43.80%] [N/A:N/A] % Reduction in Volume: 43.70% [N/A:N/A] Classification: [1:Category/Stage III] [N/A:N/A] Exudate Amount: [1:Large] [N/A:N/A] Exudate Type: [1:Serous] [N/A:N/A] Exudate Color: [1:amber] [N/A:N/A] Wound Margin: [1:Flat and Intact] [N/A:N/A] Granulation Amount: [1:Medium (34-66%)] [N/A:N/A] Granulation Quality: [1:Pink] [N/A:N/A] Necrotic Amount: [1:Medium (34-66%)] [N/A:N/A] Exposed Structures: [1:Fascia: No Fat: No Tendon: No Muscle: No Joint: No  Bone: No] [N/A:N/A] Limited to Skin Breakdown Epithelialization: Small (1-33%) N/A N/A Periwound Skin Texture: Edema: No N/A N/A Excoriation: No Induration: No Callus: No Crepitus: No Fluctuance: No Friable: No Rash: No Scarring: No Periwound Skin Moist: Yes N/A N/A Moisture: Maceration: No Dry/Scaly: No Periwound Skin Color: Erythema: Yes N/A N/A Atrophie Blanche: No Cyanosis: No Ecchymosis: No Hemosiderin Staining: No Mottled: No Pallor: No Rubor: No Erythema Location: Circumferential N/A N/A Temperature: No Abnormality N/A N/A Tenderness on Yes N/A N/A Palpation: Wound Preparation: Ulcer Cleansing: N/A N/A Rinsed/Irrigated with Saline Topical Anesthetic Applied: Other: lidocaine 4% Treatment Notes Electronic Signature(s) Signed: 04/24/2016 4:52:58 PM By: Elliot GurneyWoody, RN, BSN, Kim RN, BSN Entered By: Elliot GurneyWoody, RN, BSN, Frederick  on 04/24/2016 10:19:51 Tara Frederick ([id number]) -------------------------------------------------------------------------------- Multi-Disciplinary Care Plan Details Patient Name: Tara Frederick Date of Service: 04/24/2016 10:00 AM Medical Record Number: [id number] Patient Account Number: [id number] Date of Birth/Sex: 10/26/1923 (82 y.o. Female) Treating RN: Tara Coventry Primary Care Physician: SYSTEM, PCP Other Clinician: Referring Physician: Julieanne Cotton Treating Physician/Extender: Tara Re in Treatment: 3 Active Inactive Abuse / Safety / Falls / Self Care Management Nursing Diagnoses: Impaired physical mobility Potential for falls Goals: Patient will remain injury free Date Initiated: 04/03/2016 Goal Status: Active Interventions: Assess fall risk on admission and as needed Notes: Orientation to the Wound Care Program Nursing Diagnoses: Knowledge deficit related to the wound healing center program Goals: Patient/caregiver will verbalize understanding of the Wound Healing Center Program Date Initiated: 04/03/2016 Goal  Status: Active Interventions: Provide education on orientation to the wound center Notes: Pressure Nursing Diagnoses: Knowledge deficit related to management of pressures ulcers Goals: Patient will remain free from development of additional pressure ulcers KEUNDRA, PETRUCELLI ([id number]) Date Initiated: 04/03/2016 Goal Status: Active Interventions: Assess: immobility, friction, shearing, incontinence upon admission and as needed Provide education on pressure ulcers Notes: Wound/Skin Impairment Nursing Diagnoses: Impaired tissue integrity Goals: Patient/caregiver will verbalize understanding of skin care regimen Date Initiated: 04/03/2016 Goal Status: Active Ulcer/skin breakdown will have a volume reduction of 30% by week 4 Date Initiated: 04/03/2016 Goal Status: Active Ulcer/skin breakdown will have a volume reduction of 50% by week 8 Date Initiated: 04/03/2016 Goal Status: Active Ulcer/skin breakdown will have a volume reduction of 80% by week 12 Date Initiated: 04/03/2016 Goal Status: Active Ulcer/skin breakdown will heal within 14 weeks Date Initiated: 04/03/2016 Goal Status: Active Interventions: Assess patient/caregiver ability to obtain necessary supplies Assess patient/caregiver ability to perform ulcer/skin care regimen upon admission and as needed Assess ulceration(s) every visit Notes: Electronic Signature(s) Signed: 04/24/2016 4:52:58 PM By: Elliot Gurney, RN, BSN, Kim RN, BSN Entered By: Elliot Gurney, RN, BSN, Frederick on 04/24/2016 10:18:31 Tara Frederick ([id number]) -------------------------------------------------------------------------------- Pain Assessment Details Patient Name: Tara Frederick Date of Service: 04/24/2016 10:00 AM Medical Record Number: [id number] Patient Account Number: [id number] Date of Birth/Sex: Nov 11, 1923 (82 y.o. Female) Treating RN: Tara Coventry Primary Care Physician: SYSTEM, PCP Other Clinician: Referring Physician: Julieanne Cotton Treating  Physician/Extender: Tara Re in Treatment: 3 Active Problems Location of Pain Severity and Description of Pain Patient Has Paino Yes Site Locations Pain Location: Generalized Pain Rate the pain. Current Pain Level: 4 Pain Management and Medication Current Pain Management: Electronic Signature(s) Signed: 04/24/2016 4:52:58 PM By: Elliot Gurney, RN, BSN, Kim RN, BSN Entered By: Elliot Gurney, RN, BSN, Frederick on 04/24/2016 10:11:35 TAMBRA, MULLER ([id number]) -------------------------------------------------------------------------------- Patient/Caregiver Education Details Patient Name: Tara Frederick Date of Service: 04/24/2016 10:00 AM Medical Record Number: [id number] Patient Account Number: [id number] Date of Birth/Gender: 06/17/1924 (82 y.o. Female) Treating RN: Tara Coventry Primary Care Physician: SYSTEM, PCP Other Clinician: Referring Physician: Julieanne Cotton Treating Physician/Extender: Tara Re in Treatment: 3 Education Assessment Education Provided To: Patient Education Topics Provided Wound/Skin Impairment: Handouts: Caring for Your Ulcer, Other: continue wound care as prescribed Methods: Demonstration Responses: State content correctly Electronic Signature(s) Signed: 04/24/2016 4:52:58 PM By: Elliot Gurney, RN, BSN, Kim RN, BSN Entered By: Elliot Gurney, RN, BSN, Frederick on 04/24/2016 10:28:51 Tara Frederick ([id number]) -------------------------------------------------------------------------------- Wound Assessment Details Patient Name: Tara Frederick Date of Service: 04/24/2016 10:00 AM Medical Record Number: [id number] Patient Account Number: [id number] Date of Birth/Sex: 05-28-1924 (82 y.o. Female) Treating RN: Tara Coventry Primary Care Physician: SYSTEM, PCP Other Clinician: Referring Physician: Julieanne Cotton Treating Physician/Extender: Tara Re in  Treatment: 3 Wound Status Wound Number: 1 Primary Pressure Ulcer Etiology: Wound Location: Sacrum -  Midline Wound Open Wounding Event: Pressure Injury Status: Date Acquired: 03/20/2016 Comorbid Cataracts, Glaucoma, Asthma, Weeks Of Treatment: 3 History: Hypertension, Osteoarthritis Clustered Wound: No Photos Photo Uploaded By: Elliot Gurney, RN, BSN, Frederick on 04/24/2016 16:21:08 Wound Measurements Length: (cm) 0.9 Width: (cm) 0.5 Depth: (cm) 0.2 Area: (cm) 0.353 Volume: (cm) 0.071 % Reduction in Area: 43.8% % Reduction in Volume: 43.7% Epithelialization: Small (1-33%) Tunneling: No Undermining: No Wound Description Classification: Category/Stage III Wound Margin: Flat and Intact Exudate Amount: Large Exudate Type: Serous Exudate Color: amber Foul Odor After Cleansing: No Wound Bed Granulation Amount: Medium (34-66%) Exposed Structure Granulation Quality: Pink Fascia Exposed: No Necrotic Amount: Medium (34-66%) Fat Layer Exposed: No Necrotic Quality: Adherent Slough Tendon Exposed: No Muscle Exposed: No Joint Exposed: No Bone Exposed: No Widger, Liseth ([id number]) Limited to Skin Breakdown Periwound Skin Texture Texture Color No Abnormalities Noted: No No Abnormalities Noted: No Callus: No Atrophie Blanche: No Crepitus: No Cyanosis: No Excoriation: No Ecchymosis: No Fluctuance: No Erythema: Yes Friable: No Erythema Location: Circumferential Induration: No Hemosiderin Staining: No Localized Edema: No Mottled: No Rash: No Pallor: No Scarring: No Rubor: No Moisture Temperature / Pain No Abnormalities Noted: No Temperature: No Abnormality Dry / Scaly: No Tenderness on Palpation: Yes Maceration: No Moist: Yes Wound Preparation Ulcer Cleansing: Rinsed/Irrigated with Saline Topical Anesthetic Applied: Other: lidocaine 4%, Treatment Notes Wound #1 (Midline Sacrum) 1. Cleansed with: Clean wound with Normal Saline 2. Anesthetic Topical Lidocaine 4% cream to wound bed prior to debridement 4. Dressing Applied: Santyl Ointment 5. Secondary Dressing  Applied Bordered Foam Dressing Dry Gauze Electronic Signature(s) Signed: 04/24/2016 4:52:58 PM By: Elliot Gurney, RN, BSN, Kim RN, BSN Entered By: Elliot Gurney, RN, BSN, Frederick on 04/24/2016 10:16:04 Tara Frederick ([id number]) -------------------------------------------------------------------------------- Vitals Details Patient Name: Tara Frederick Date of Service: 04/24/2016 10:00 AM Medical Record Number: [id number] Patient Account Number: [id number] Date of Birth/Sex: 05-Aug-1924 (82 y.o. Female) Treating RN: Tara Coventry Primary Care Physician: SYSTEM, PCP Other Clinician: Referring Physician: Julieanne Cotton Treating Physician/Extender: Tara Re in Treatment: 3 Vital Signs Time Taken: 10:11 Temperature (F): 97.8 Height (in): 61 Pulse (bpm): 79 Weight (lbs): 113 Respiratory Rate (breaths/min): 16 Body Mass Index (BMI): 21.3 Blood Pressure (mmHg): 88/59 Reference Range: 80 - 120 mg / dl Electronic Signature(s) Signed: 04/24/2016 4:52:58 PM By: Elliot Gurney, RN, BSN, Kim RN, BSN Entered By: Elliot Gurney, RN, BSN, Frederick on 04/24/2016 10:11:57

## 2016-04-25 NOTE — Progress Notes (Signed)
Tara, Frederick ([id number]) Visit Report for 04/24/2016 Chief Complaint Document Details Patient Name: Tara, Frederick 04/24/2016 10:00 Date of Service: AM Medical Record [id number] Number: Patient Account Number: [id number] 1924-06-08 (82 y.o. Treating RN: Huel Coventry Date of Birth/Sex: Female) Other Clinician: Primary Care Physician: SYSTEM, PCP Treating Evlyn Kanner Referring Physician: Julieanne Cotton Physician/Extender: Weeks in Treatment: 3 Information Obtained from: Patient Chief Complaint Patient is at the clinic for treatment of an open pressure ulcer to the sacral region for about 2 weeks Electronic Signature(s) Signed: 04/24/2016 10:27:48 AM By: Evlyn Kanner MD, FACS Entered By: Evlyn Kanner on 04/24/2016 10:27:47 Tara Frederick ([id number]) -------------------------------------------------------------------------------- Debridement Details Patient Name: Tara, Frederick 04/24/2016 10:00 Date of Service: AM Medical Record [id number] Number: Patient Account Number: [id number] 05/11/1924 (82 y.o. Treating RN: Huel Coventry Date of Birth/Sex: Female) Other Clinician: Primary Care Physician: SYSTEM, PCP Treating Debroah Shuttleworth Referring Physician: Julieanne Cotton Physician/Extender: Weeks in Treatment: 3 Debridement Performed for Wound #1 Midline Sacrum Assessment: Performed By: Physician Evlyn Kanner, MD Debridement: Debridement Pre-procedure Yes - 10:21 Verification/Time Out Taken: Start Time: 10:22 Pain Control: Other : lidocine 4% Level: Skin/Subcutaneous Tissue Total Area Debrided (L x 0.9 (cm) x 0.5 (cm) = 0.45 (cm) W): Tissue and other Non-Viable, Exudate, Fibrin/Slough, Subcutaneous material debrided: Instrument: Curette Bleeding: Minimum Hemostasis Achieved: Pressure End Time: 10:24 Procedural Pain: 0 Post Procedural Pain: 0 Response to Treatment: Procedure was tolerated well Post Debridement Measurements of Total Wound Length: (cm) 0.9 Stage:  Category/Stage III Width: (cm) 0.5 Depth: (cm) 0.2 Volume: (cm) 0.071 Character of Wound/Ulcer Post Requires Further Debridement: Debridement Severity of Tissue Post Fat layer exposed Debridement: Post Procedure Diagnosis Same as Pre-procedure Electronic Signature(s) Signed: 04/24/2016 10:27:41 AM By: Evlyn Kanner MD, FACS Tara, Frederick ([id number]) Signed: 04/24/2016 4:52:58 PM By: Elliot Gurney, RN, BSN, Kim RN, BSN Entered By: Evlyn Kanner on 04/24/2016 10:27:41 Tara, Frederick ([id number]) -------------------------------------------------------------------------------- HPI Details Patient Name: Tara, Frederick 04/24/2016 10:00 Date of Service: AM Medical Record [id number] Number: Patient Account Number: [id number] 02/05/1924 (82 y.o. Treating RN: Huel Coventry Date of Birth/Sex: Female) Other Clinician: Primary Care Physician: SYSTEM, PCP Treating Evlyn Kanner Referring Physician: Julieanne Cotton Physician/Extender: Weeks in Treatment: 3 History of Present Illness Location: sacral region Quality: Patient reports experiencing a sharp pain to affected area(s). Severity: Patient states wound are getting worse. Duration: Patient has had the wound for < 2 weeks prior to presenting for treatment Timing: Pain in wound is Intermittent (comes and goes Context: The wound would happen gradually Modifying Factors: Other treatment(s) tried include:since L1 compression fracturesurgery for a Associated Signs and Symptoms: Patient reports having difficulty standing for long periods. HPI Description: 82 year old patient who was recently admitted to [facility] on July 28 was discharged on August 10. she was treated for severe constipation, L1 compression fracture and is status post kyphoplasty on 8/8. also treated for a UTI with Levaquin and continued on supportive care. Past medical history significant for hypertension, GERD, degenerative disc disease. Status post appendectomy,  hemorrhoid surgery and a total abdominal hysterectomy with bilateral salpingo- oophorectomy. Present she is in a rehabilitation facility and ambulating with a walker. Her protein intake is said to be good and she is on vitamin supplements. She has an appropriate bed surface. Electronic Signature(s) Signed: 04/24/2016 10:27:52 AM By: Evlyn Kanner MD, FACS Entered By: Evlyn Kanner on 04/24/2016 10:27:52 Tara, Frederick ([id number]) -------------------------------------------------------------------------------- Physical Exam Details Patient Name: Tara, Frederick 04/24/2016 10:00 Date of Service: AM Medical Record [id number] Number: Patient Account Number: [id number] [birth year] (82 y.o. Treating RN: Elliot Gurney,  Selena Batten Date of Birth/Sex: Female) Other Clinician: Primary Care Physician: SYSTEM, PCP Treating Evlyn Kanner Referring Physician: Julieanne Cotton Physician/Extender: Weeks in Treatment: 3 Constitutional . Pulse regular. Respirations normal and unlabored. Afebrile. . Eyes Nonicteric. Reactive to light. Ears, Nose, Mouth, and Throat Lips, teeth, and gums WNL.Marland Kitchen Moist mucosa without lesions. Neck supple and nontender. No palpable supraclavicular or cervical adenopathy. Normal sized without goiter. Respiratory WNL. No retractions.. Cardiovascular Pedal Pulses WNL. No clubbing, cyanosis or edema. Lymphatic No adneopathy. No adenopathy. No adenopathy. Musculoskeletal Adexa without tenderness or enlargement.. Digits and nails w/o clubbing, cyanosis, infection, petechiae, ischemia, or inflammatory conditions.. Integumentary (Hair, Skin) No suspicious lesions. No crepitus or fluctuance. No peri-wound warmth or erythema. No masses.Marland Kitchen Psychiatric Judgement and insight Intact.. No evidence of depression, anxiety, or agitation.. Notes the patient continues to have subcutaneous debris and slough which I sharply removed with a #3 curet and minimal bleeding controlled with  pressure. Electronic Signature(s) Signed: 04/24/2016 10:28:27 AM By: Evlyn Kanner MD, FACS Entered By: Evlyn Kanner on 04/24/2016 10:28:26 Tara, Frederick ([id number]) -------------------------------------------------------------------------------- Physician Orders Details Patient Name: Tara, Frederick 04/24/2016 10:00 Date of Service: AM Medical Record [id number] Number: Patient Account Number: [id number] 12/03/1923 (82 y.o. Treating RN: Huel Coventry Date of Birth/Sex: Female) Other Clinician: Primary Care Physician: SYSTEM, PCP Treating Evlyn Kanner Referring Physician: Julieanne Cotton Physician/Extender: Tania Ade in Treatment: 3 Verbal / Phone Orders: Yes Clinician: Huel Coventry Read Back and Verified: Yes Diagnosis Coding Wound Cleansing Wound #1 Midline Sacrum o Clean wound with Normal Saline. Anesthetic Wound #1 Midline Sacrum o Topical Lidocaine 4% cream applied to wound bed prior to debridement Skin Barriers/Peri-Wound Care Wound #1 Midline Sacrum o Skin Prep Primary Wound Dressing Wound #1 Midline Sacrum o Santyl Ointment Secondary Dressing Wound #1 Midline Sacrum o Dry Gauze o Boardered Foam Dressing Dressing Change Frequency Wound #1 Midline Sacrum o Change dressing every day. Follow-up Appointments Wound #1 Midline Sacrum o Return Appointment in 1 week. Off-Loading Wound #1 Midline Sacrum o Turn and reposition every 2 hours Falter, Klarissa ([id number]) Additional Orders / Instructions Wound #1 Midline Sacrum o Increase protein intake. Medications-please add to medication list. Wound #1 Midline Sacrum o Other: - Please add vitamin C and zinc and vitamin A to patient's medications daily Electronic Signature(s) Signed: 04/24/2016 4:10:09 PM By: Evlyn Kanner MD, FACS Signed: 04/24/2016 4:52:58 PM By: Elliot Gurney RN, BSN, Kim RN, BSN Entered By: Elliot Gurney, RN, BSN, Kim on 04/24/2016 10:27:39 Tara, Frederick  ([id number]) -------------------------------------------------------------------------------- Problem List Details Patient Name: Tara, Frederick 04/24/2016 10:00 Date of Service: AM Medical Record [id number] Number: Patient Account Number: [id number] August 28, 1923 (82 y.o. Treating RN: Huel Coventry Date of Birth/Sex: Female) Other Clinician: Primary Care Physician: SYSTEM, PCP Treating Evlyn Kanner Referring Physician: Julieanne Cotton Physician/Extender: Weeks in Treatment: 3 Active Problems ICD-10 Encounter Code Description Active Date Diagnosis L89.153 Pressure ulcer of sacral region, stage 3 04/03/2016 Yes S32.010D Wedge compression fracture of first lumbar vertebra, 04/03/2016 Yes subsequent encounter for fracture with routine healing Inactive Problems Resolved Problems Electronic Signature(s) Signed: 04/24/2016 10:27:33 AM By: Evlyn Kanner MD, FACS Entered By: Evlyn Kanner on 04/24/2016 10:27:32 Tara Frederick ([id number]) -------------------------------------------------------------------------------- Progress Note Details Patient Name: Tara, Frederick 04/24/2016 10:00 Date of Service: AM Medical Record [id number] Number: Patient Account Number: [id number] November 27, 1923 (82 y.o. Treating RN: Huel Coventry Date of Birth/Sex: Female) Other Clinician: Primary Care Physician: SYSTEM, PCP Treating Evlyn Kanner Referring Physician: Julieanne Cotton Physician/Extender: Tania Ade in Treatment: 3 Subjective Chief Complaint Information obtained from Patient Patient is at the clinic for treatment of  an open pressure ulcer to the sacral region for about 2 weeks History of Present Illness (HPI) The following HPI elements were documented for the patient's wound: Location: sacral region Quality: Patient reports experiencing a sharp pain to affected area(s). Severity: Patient states wound are getting worse. Duration: Patient has had the wound for < 2 weeks prior to presenting for  treatment Timing: Pain in wound is Intermittent (comes and goes Context: The wound would happen gradually Modifying Factors: Other treatment(s) tried include:since L1 compression fracturesurgery for a Associated Signs and Symptoms: Patient reports having difficulty standing for long periods. 82 year old patient who was recently admitted to [facility] on July 28 was discharged on August 10. she was treated for severe constipation, L1 compression fracture and is status post kyphoplasty on 8/8. also treated for a UTI with Levaquin and continued on supportive care. Past medical history significant for hypertension, GERD, degenerative disc disease. Status post appendectomy, hemorrhoid surgery and a total abdominal hysterectomy with bilateral salpingo- oophorectomy. Present she is in a rehabilitation facility and ambulating with a walker. Her protein intake is said to be good and she is on vitamin supplements. She has an appropriate bed surface. Objective Constitutional Pulse regular. Respirations normal and unlabored. Afebrile. Vitals Time Taken: 10:11 AM, Height: 61 in, Weight: 113 lbs, BMI: 21.3, Temperature: 97.8 F, Pulse: 79 Tara Frederick, Tara Frederick ([id number]) bpm, Respiratory Rate: 16 breaths/min, Blood Pressure: 88/59 mmHg. Eyes Nonicteric. Reactive to light. Ears, Nose, Mouth, and Throat Lips, teeth, and gums WNL.Marland Kitchen Moist mucosa without lesions. Neck supple and nontender. No palpable supraclavicular or cervical adenopathy. Normal sized without goiter. Respiratory WNL. No retractions.. Cardiovascular Pedal Pulses WNL. No clubbing, cyanosis or edema. Lymphatic No adneopathy. No adenopathy. No adenopathy. Musculoskeletal Adexa without tenderness or enlargement.. Digits and nails w/o clubbing, cyanosis, infection, petechiae, ischemia, or inflammatory conditions.Marland Kitchen Psychiatric Judgement and insight Intact.. No evidence of depression, anxiety, or agitation.. General Notes: the  patient continues to have subcutaneous debris and slough which I sharply removed with a #3 curet and minimal bleeding controlled with pressure. Integumentary (Hair, Skin) No suspicious lesions. No crepitus or fluctuance. No peri-wound warmth or erythema. No masses.. Wound #1 status is Open. Original cause of wound was Pressure Injury. The wound is located on the Midline Sacrum. The wound measures 0.9cm length x 0.5cm width x 0.2cm depth; 0.353cm^2 area and 0.071cm^3 volume. The wound is limited to skin breakdown. There is no tunneling or undermining noted. There is a large amount of serous drainage noted. The wound margin is flat and intact. There is medium (34-66%) pink granulation within the wound bed. There is a medium (34-66%) amount of necrotic tissue within the wound bed including Adherent Slough. The periwound skin appearance exhibited: Moist, Erythema. The periwound skin appearance did not exhibit: Callus, Crepitus, Excoriation, Fluctuance, Friable, Induration, Localized Edema, Rash, Scarring, Dry/Scaly, Maceration, Atrophie Blanche, Cyanosis, Ecchymosis, Hemosiderin Staining, Mottled, Pallor, Rubor. The surrounding wound skin color is noted with erythema which is circumferential. Periwound temperature was noted as No Abnormality. The periwound has tenderness on palpation. Assessment Tara, Frederick ([id number]) Active Problems ICD-10 L89.153 - Pressure ulcer of sacral region, stage 3 S32.010D - Wedge compression fracture of first lumbar vertebra, subsequent encounter for fracture with routine healing Procedures Wound #1 Wound #1 is a Pressure Ulcer located on the Midline Sacrum . There was a Skin/Subcutaneous Tissue Debridement ([id number]) debridement with total area of 0.45 sq cm performed by Evlyn Kanner, MD. with the following instrument(s): Curette to remove Non-Viable tissue/material including Exudate, Fibrin/Slough, and  Subcutaneous after achieving pain control using Other  (lidocine 4%). A time out was conducted at 10:21, prior to the start of the procedure. A Minimum amount of bleeding was controlled with Pressure. The procedure was tolerated well with a pain level of 0 throughout and a pain level of 0 following the procedure. Post Debridement Measurements: 0.9cm length x 0.5cm width x 0.2cm depth; 0.071cm^3 volume. Post debridement Stage noted as Category/Stage III. Character of Wound/Ulcer Post Debridement requires further debridement. Severity of Tissue Post Debridement is: Fat layer exposed. Post procedure Diagnosis Wound #1: Same as Pre-Procedure Plan Wound Cleansing: Wound #1 Midline Sacrum: Clean wound with Normal Saline. Anesthetic: Wound #1 Midline Sacrum: Topical Lidocaine 4% cream applied to wound bed prior to debridement Skin Barriers/Peri-Wound Care: Wound #1 Midline Sacrum: Skin Prep Primary Wound Dressing: Wound #1 Midline Sacrum: Santyl Ointment Secondary Dressing: Wound #1 Midline Sacrum: Dry Gauze Tara Frederick, Tara Frederick ([id number]) Boardered Foam Dressing Dressing Change Frequency: Wound #1 Midline Sacrum: Change dressing every day. Follow-up Appointments: Wound #1 Midline Sacrum: Return Appointment in 1 week. Off-Loading: Wound #1 Midline Sacrum: Turn and reposition every 2 hours Additional Orders / Instructions: Wound #1 Midline Sacrum: Increase protein intake. Medications-please add to medication list.: Wound #1 Midline Sacrum: Other: - Please add vitamin C and zinc and vitamin A to patient's medications daily I have recommended: 1. Santyl ointment to be applied daily with a bordered foam. 2. sent off loading and laying on a proper mattress appropriate for her care 3. Appropriate amount of proteins and vitamins A, C and zinc 4. Regular visits to the wound center She and her daughter at the bedside have had all questions answered. Electronic Signature(s) Signed: 04/24/2016 10:28:38 AM By: Evlyn KannerBritto, Durante Violett MD, FACS Entered By:  Evlyn KannerBritto, Jaymar Loeber on 04/24/2016 10:28:38 Tara Frederick, Sandria ([id number]) -------------------------------------------------------------------------------- SuperBill Details Patient Name: Tara Frederick, Aneesah Date of Service: 04/24/2016 Medical Record Number: [id number] Patient Account Number: [id number] Date of Birth/Sex: [birth year] (82 y.o. Female) Treating RN: Huel CoventryWoody, Kim Primary Care Physician: SYSTEM, PCP Other Clinician: Referring Physician: Julieanne CottonEVESHWAR, SANJEEV Treating Physician/Extender: Rudene ReBritto, Mariacristina Aday Weeks in Treatment: 3 Diagnosis Coding ICD-10 Codes Code Description [phone] Pressure ulcer of sacral region, stage 3 Wedge compression fracture of first lumbar vertebra, subsequent encounter for fracture S32.010D with routine healing Facility Procedures CPT4: Description Modifier Quantity Code [id number] 11042 - DEB SUBQ TISSUE 20 SQ CM/< 1 ICD-10 Description Diagnosis L89.153 Pressure ulcer of sacral region, stage 3 S32.010D Wedge compression fracture of first lumbar vertebra, subsequent encounter for  fracture with routine healing Physician Procedures CPT4: Description Modifier Quantity Code [id number] 11042 - WC PHYS SUBQ TISS 20 SQ CM 1 ICD-10 Description Diagnosis L89.153 Pressure ulcer of sacral region, stage 3 S32.010D Wedge compression fracture of first lumbar vertebra, subsequent encounter for  fracture with routine healing Electronic Signature(s) Signed: 04/24/2016 10:28:47 AM By: Evlyn KannerBritto, Honestie Kulik MD, FACS Entered By: Evlyn KannerBritto, Hilde Churchman on 04/24/2016 10:28:47

## 2016-04-26 ENCOUNTER — Ambulatory Visit (HOSPITAL_COMMUNITY)
Admission: RE | Admit: 2016-04-26 | Discharge: 2016-04-26 | Disposition: A | Discharge status: Home / Self Care | Payer: Medicare Other | Source: Ambulatory Visit | Attending: Interventional Radiology | Admitting: Interventional Radiology

## 2016-04-26 ENCOUNTER — Other Ambulatory Visit (HOSPITAL_COMMUNITY): Payer: Self-pay | Admitting: Interventional Radiology

## 2016-04-26 ENCOUNTER — Ambulatory Visit (HOSPITAL_COMMUNITY): Admission: RE | Admit: 2016-04-26 | Payer: Medicare Other | Source: Ambulatory Visit

## 2016-04-26 DIAGNOSIS — M4856XA Collapsed vertebra, not elsewhere classified, lumbar region, initial encounter for fracture: Secondary | ICD-10-CM | POA: Diagnosis not present

## 2016-04-26 DIAGNOSIS — M549 Dorsalgia, unspecified: Secondary | ICD-10-CM | POA: Diagnosis present

## 2016-04-26 DIAGNOSIS — M4805 Spinal stenosis, thoracolumbar region: Secondary | ICD-10-CM | POA: Diagnosis not present

## 2016-04-26 DIAGNOSIS — IMO0002 Reserved for concepts with insufficient information to code with codable children: Secondary | ICD-10-CM

## 2016-04-26 DIAGNOSIS — M4806 Spinal stenosis, lumbar region: Secondary | ICD-10-CM | POA: Insufficient documentation

## 2016-04-26 DIAGNOSIS — M4854XA Collapsed vertebra, not elsewhere classified, thoracic region, initial encounter for fracture: Secondary | ICD-10-CM | POA: Insufficient documentation

## 2016-05-01 ENCOUNTER — Encounter: Payer: No Typology Code available for payment source | Admitting: Surgery

## 2016-05-01 DIAGNOSIS — L89153 Pressure ulcer of sacral region, stage 3: Secondary | ICD-10-CM | POA: Diagnosis not present

## 2016-05-01 NOTE — Progress Notes (Addendum)
Tara Frederick, Tara Frederick ([id number]) Visit Report for 05/01/2016 Chief Complaint Document Details Patient Name: Tara Frederick, Tara Frederick 05/01/2016 10:00 Date of Service: AM Medical Record [id number] Number: Patient Account Number: [id number] 10-14-1923 (82 y.o. Treating RN: Tara Frederick, Tara Frederick Date of Birth/Sex: Female) Other Clinician: Primary Care Physician: SYSTEM, PCP Treating Tara Frederick, Tara Frederick Referring Physician: Julieanne Frederick, Tara Frederick Physician/Extender: Weeks in Treatment: 4 Information Obtained from: Patient Chief Complaint Patient is at the clinic for treatment of an open pressure ulcer to the sacral region for about 2 weeks Electronic Signature(s) Signed: 05/01/2016 10:22:43 AM By: Tara Frederick, Tara Noto MD, FACS Entered By: Tara Frederick, Tara Frederick on 05/01/2016 10:22:43 Tara Frederick, Tara Frederick ([id number]) -------------------------------------------------------------------------------- HPI Details Patient Name: Tara Frederick, Tara Frederick 05/01/2016 10:00 Date of Service: AM Medical Record [id number] Number: Patient Account Number: [id number] 10-14-1923 (82 y.o. Treating RN: Tara Frederick, Tara Frederick Date of Birth/Sex: Female) Other Clinician: Primary Care Physician: SYSTEM, PCP Treating Tara Frederick, Tara Frederick Referring Physician: Julieanne Frederick, Tara Frederick Physician/Extender: Weeks in Treatment: 4 History of Present Illness Location: sacral region Quality: Patient reports experiencing a sharp pain to affected area(s). Severity: Patient states wound are getting worse. Duration: Patient has had the wound for < 2 weeks prior to presenting for treatment Timing: Pain in wound is Intermittent (comes and goes Context: The wound would happen gradually Modifying Factors: Other treatment(s) tried include:since L1 compression fracturesurgery for a Associated Signs and Symptoms: Patient reports having difficulty standing for long periods. HPI Description: 82 year old patient who [facility] was discharged on August 10. She was  treated for severe constipation, L1 compression fracture and is status post kyphoplasty on 8/8. Also treated for a UTI with Levaquin and continued on supportive care. Past medical history significant for hypertension, GERD, degenerative disc disease. Status post appendectomy, hemorrhoid surgery and a total abdominal hysterectomy with bilateral salpingo- oophorectomy. Present she is in a rehabilitation facility and ambulating with a walker. Her protein intake is said to be good and she is on vitamin supplements. She has an appropriate bed surface. Electronic Signature(s) Signed: 05/01/2016 10:23:14 AM By: Tara Frederick, Tara Cheatwood MD, FACS Entered By: Tara Frederick, Tara Frederick on 05/01/2016 10:23:14 Tara Frederick, Tara Frederick ([id number]) -------------------------------------------------------------------------------- Physical Exam Details Patient Name: Tara Frederick, Tara Frederick 05/01/2016 10:00 Date of Service: AM Medical Record [id number] Number: Patient Account Number: [id number] 10-14-1923 (82 y.o. Treating RN: Tara Frederick, Tara Frederick Date of Birth/Sex: Female) Other Clinician: Primary Care Physician: SYSTEM, PCP Treating Tara Frederick, Tara Frederick Referring Physician: Julieanne Frederick, Tara Frederick Physician/Extender: Weeks in Treatment: 4 Constitutional . Pulse regular. Respirations normal and unlabored. Afebrile. . Eyes Nonicteric. Reactive to light. Ears, Nose, Mouth, and Throat Lips, teeth, and gums WNL.Marland Kitchen. Moist mucosa without lesions. Neck supple and nontender. No palpable supraclavicular or cervical adenopathy. Normal sized without goiter. Respiratory WNL. No retractions.. Cardiovascular Pedal Pulses WNL. No clubbing, cyanosis or edema. Chest Breasts symmetical and no nipple discharge.. Breast tissue WNL, no masses, lumps, or tenderness.. Gastrointestinal (GI) Abdomen without masses or tenderness.. No liver or spleen enlargement or tenderness.. Genitourinary (GU) No hydrocele, spermatocele, tenderness of the cord, or testicular mass.Marland Kitchen. Penis without  lesions.Tara Frederick. Genitalia without lesions. No cystocele, or rectocele. Pelvic support intact, no discharge.Marland Kitchen. Urethra without masses, tenderness or scarring.Marland Kitchen. Lymphatic No adneopathy. No adenopathy. No adenopathy. Musculoskeletal Adexa without tenderness or enlargement.. Digits and nails w/o clubbing, cyanosis, infection, petechiae, ischemia, or inflammatory conditions.. Integumentary (Hair, Skin) No suspicious lesions. No crepitus or fluctuance. No peri-wound warmth or erythema. No masses.Marland Kitchen. Psychiatric Judgement and insight Intact.. No evidence of depression, anxiety, or agitation.. Notes the patient has done very well and the wound is smaller and there is  less debris. No sharp debridement was required today. Tara Frederick ([id number]) Electronic Signature(s) Signed: 05/01/2016 10:33:15 AM By: Tara Kanner MD, FACS Entered By: Tara Kanner on 05/01/2016 10:33:15 [name], Frederick ([id number]) -------------------------------------------------------------------------------- Physician Orders Details Patient Name: Tara Frederick, Tara Frederick 05/01/2016 10:00 Date of Service: AM Medical Record [id number] Number: Patient Account Number: [id number] 04/12/1924 (82 y.o. Treating RN: Tara Frederick Date of Birth/Sex: Female) Other Clinician: Primary Care Physician: SYSTEM, PCP Treating Tara Kanner Referring Physician: Julieanne Cotton Physician/Extender: Weeks in Treatment: 4 Verbal / Phone Orders: Yes Clinician: Curtis Frederick Read Back and Verified: Yes Diagnosis Coding ICD-10 Coding Code Description L89.153 Pressure ulcer of sacral region, stage 3 Wedge compression fracture of first lumbar vertebra, subsequent encounter for fracture S32.010D with routine healing Wound Cleansing Wound #1 Midline Sacrum o Clean wound with Normal Saline. Anesthetic Wound #1 Midline Sacrum o Topical Lidocaine 4% cream applied to wound bed prior to debridement Skin Barriers/Peri-Wound Care Wound #1 Midline  Sacrum o Skin Prep Primary Wound Dressing Wound #1 Midline Sacrum o Aquacel Ag - or silver alginate Secondary Dressing Wound #1 Midline Sacrum o Boardered Foam Dressing Dressing Change Frequency Wound #1 Midline Sacrum o Change dressing every day. Follow-up Appointments Wound #1 Midline Sacrum o Return Appointment in 1 week. ARYAHNA, SPAGNA ([id number]) Off-Loading Wound #1 Midline Sacrum o Turn and reposition every 2 hours Additional Orders / Instructions Wound #1 Midline Sacrum o Increase protein intake. Medications-please add to medication list. Wound #1 Midline Sacrum o Other: - Please add vitamin C and zinc and vitamin A to patient's medications daily Electronic Signature(s) Signed: 05/01/2016 2:42:00 PM By: Tara Frederick Signed: 05/01/2016 3:04:46 PM By: Tara Kanner MD, FACS Entered By: Tara Frederick on 05/01/2016 10:31:04 LALANYA, RUFENER ([id number]) -------------------------------------------------------------------------------- Problem List Details Patient Name: Tara Frederick, Tara Frederick 05/01/2016 10:00 Date of Service: AM Medical Record [id number] Number: Patient Account Number: [id number] Feb 03, 1924 (82 y.o. Treating RN: Tara Frederick Date of Birth/Sex: Female) Other Clinician: Primary Care Physician: SYSTEM, PCP Treating Tara Kanner Referring Physician: Julieanne Cotton Physician/Extender: Weeks in Treatment: 4 Active Problems ICD-10 Encounter Code Description Active Date Diagnosis L89.153 Pressure ulcer of sacral region, stage 3 04/03/2016 Yes S32.010D Wedge compression fracture of first lumbar vertebra, 04/03/2016 Yes subsequent encounter for fracture with routine healing Inactive Problems Resolved Problems Electronic Signature(s) Signed: 05/01/2016 10:22:34 AM By: Tara Kanner MD, FACS Entered By: Tara Kanner on 05/01/2016 10:22:34 Tara German  ([id number]) -------------------------------------------------------------------------------- Progress Note Details Patient Name: Tara Frederick, Tara Frederick 05/01/2016 10:00 Date of Service: AM Medical Record [id number] Number: Patient Account Number: [id number] [birth year] (82 y.o. Treating RN: Tara Frederick Date of Birth/Sex: Female) Other Clinician: Primary Care Physician: SYSTEM, PCP Treating Tara Kanner Referring Physician: Julieanne Cotton Physician/Extender: Weeks in Treatment: 4 Subjective Chief Complaint Information obtained from Patient Patient is at the clinic for treatment of an open pressure ulcer to the sacral region for about 2 weeks History of Present Illness (HPI) The following HPI elements were documented for the patient's wound: Location: sacral region Quality: Patient reports experiencing a sharp pain to affected area(s). Severity: Patient states wound are getting worse. Duration: Patient has had the wound for < 2 weeks prior to presenting for treatment Timing: Pain in wound is Intermittent (comes and goes Context: The wound would happen gradually Modifying Factors: Other treatment(s) tried include:since L1 compression fracturesurgery for a Associated Signs and Symptoms: Patient reports having difficulty standing for long periods. 82 year old patient who was recently admitted to [facility] on July 28 was discharged on August 10. She was treated for severe constipation, L1  compression fracture and is status post kyphoplasty on 8/8. Also treated for a UTI with Levaquin and continued on supportive care. Past medical history significant for hypertension, GERD, degenerative disc disease. Status post appendectomy, hemorrhoid surgery and a total abdominal hysterectomy with bilateral salpingo- oophorectomy. Present she is in a rehabilitation facility and ambulating with a walker. Her protein intake is said to be good and she is on vitamin supplements. She has an  appropriate bed surface. Objective Constitutional Pulse regular. Respirations normal and unlabored. Afebrile. Vitals Time Taken: 10:16 AM, Height: 61 in, Weight: 113 lbs, BMI: 21.3, Temperature: 97.9 F, Pulse: 77 Tara Frederick, Tara Frederick ([id number]) bpm, Respiratory Rate: 16 breaths/min, Blood Pressure: 125/64 mmHg. Eyes Nonicteric. Reactive to light. Ears, Nose, Mouth, and Throat Lips, teeth, and gums WNL.Marland Kitchen Moist mucosa without lesions. Neck supple and nontender. No palpable supraclavicular or cervical adenopathy. Normal sized without goiter. Respiratory WNL. No retractions.. Cardiovascular Pedal Pulses WNL. No clubbing, cyanosis or edema. Chest Breasts symmetical and no nipple discharge.. Breast tissue WNL, no masses, lumps, or tenderness.. Gastrointestinal (GI) Abdomen without masses or tenderness.. No liver or spleen enlargement or tenderness.. Genitourinary (GU) No hydrocele, spermatocele, tenderness of the cord, or testicular mass.Marland Kitchen Penis without lesions.Tara Frederick without lesions. No cystocele, or rectocele. Pelvic support intact, no discharge.Marland Kitchen Urethra without masses, tenderness or scarring.Marland Kitchen Lymphatic No adneopathy. No adenopathy. No adenopathy. Musculoskeletal Adexa without tenderness or enlargement.. Digits and nails w/o clubbing, cyanosis, infection, petechiae, ischemia, or inflammatory conditions.Marland Kitchen Psychiatric Judgement and insight Intact.. No evidence of depression, anxiety, or agitation.. General Notes: the patient has done very well and the wound is smaller and there is less debris. No sharp debridement was required today. Integumentary (Hair, Skin) No suspicious lesions. No crepitus or fluctuance. No peri-wound warmth or erythema. No masses.. Wound #1 status is Open. Original cause of wound was Pressure Injury. The wound is located on the Midline Sacrum. The wound measures 0.6cm length x 0.5cm width x 0.2cm depth; 0.236cm^2 area and 0.047cm^3 volume. The wound is  limited to skin breakdown. There is no tunneling or undermining noted. There is a large amount of serous drainage noted. The wound margin is flat and intact. There is medium (34-66%) pink granulation within the wound bed. There is a medium (34-66%) amount of necrotic tissue Detty, Rosslyn ([id number]) within the wound bed including Adherent Slough. The periwound skin appearance exhibited: Moist, Erythema. The periwound skin appearance did not exhibit: Callus, Crepitus, Excoriation, Fluctuance, Friable, Induration, Localized Edema, Rash, Scarring, Dry/Scaly, Maceration, Atrophie Blanche, Cyanosis, Ecchymosis, Hemosiderin Staining, Mottled, Pallor, Rubor. The surrounding wound skin color is noted with erythema which is circumferential. Periwound temperature was noted as No Abnormality. The periwound has tenderness on palpation. Assessment Active Problems ICD-10 L89.153 - Pressure ulcer of sacral region, stage 3 S32.010D - Wedge compression fracture of first lumbar vertebra, subsequent encounter for fracture with routine healing Plan Wound Cleansing: Wound #1 Midline Sacrum: Clean wound with Normal Saline. Anesthetic: Wound #1 Midline Sacrum: Topical Lidocaine 4% cream applied to wound bed prior to debridement Skin Barriers/Peri-Wound Care: Wound #1 Midline Sacrum: Skin Prep Primary Wound Dressing: Wound #1 Midline Sacrum: Aquacel Ag - or silver alginate Secondary Dressing: Wound #1 Midline Sacrum: Boardered Foam Dressing Dressing Change Frequency: Wound #1 Midline Sacrum: Change dressing every day. Follow-up Appointments: Wound #1 Midline Sacrum: Return Appointment in 1 week. Off-Loading: Wound #1 Midline Sacrum: Turn and reposition every 2 hours Shanholtzer, Nazaria ([id number]) Additional Orders / Instructions: Wound #1 Midline Sacrum: Increase protein intake. Medications-please add to medication list.: Wound #  1 Midline Sacrum: Other: - Please add vitamin C and zinc and vitamin  A to patient's medications daily I have recommended: 1. Silver alginate to be applied on alternate days with a bordered foam. 2. discussed off loading and laying on a proper mattress appropriate for her care 3. Appropriate amount of proteins and vitamins A, C and zinc 4. Regular visits to the wound center She and her daughter at the bedside have had all questions answered. Electronic Signature(s) Signed: 05/01/2016 3:11:39 PM By: Tara Kanner MD, FACS Previous Signature: 05/01/2016 10:34:33 AM Version By: Tara Kanner MD, FACS Entered By: Tara Kanner on 05/01/2016 15:11:39 LANDRIE, BEALE ([id number]) -------------------------------------------------------------------------------- SuperBill Details Patient Name: Tara German Date of Service: 05/01/2016 Medical Record Number: [id number] Patient Account Number: [id number] Date of Birth/Sex: [birth year] (82 y.o. Female) Treating RN: Tara Frederick Primary Care Physician: SYSTEM, PCP Other Clinician: Referring Physician: Julieanne Cotton Treating Physician/Extender: Rudene Re in Treatment: 4 Diagnosis Coding ICD-10 Codes Code Description [phone] Pressure ulcer of sacral region, stage 3 Wedge compression fracture of first lumbar vertebra, subsequent encounter for fracture S32.010D with routine healing Facility Procedures CPT4 Code: [id number] Description: 84132 - WOUND CARE VISIT-LEV 2 EST PT Modifier: Quantity: 1 Physician Procedures CPT4: Description Modifier Quantity Code [id number] - WC PHYS LEVEL 3 - EST PT 1 ICD-10 Description Diagnosis L89.153 Pressure ulcer of sacral region, stage 3 S32.010D Wedge compression fracture of first lumbar vertebra, subsequent encounter for  fracture with routine healing Electronic Signature(s) Signed: 05/01/2016 10:34:52 AM By: Tara Kanner MD, FACS Entered By: Tara Kanner on 05/01/2016 10:34:52

## 2016-05-01 NOTE — Progress Notes (Signed)
Tara Frederick ([id number]) Visit Report for 05/01/2016 Arrival Information Details Patient Name: Tara Frederick, Tara Frederick Date of Service: 05/01/2016 10:00 AM Medical Record Number: [id number] Patient Account Number: [id number] Date of Birth/Sex: 26-Jul-1924 (82 y.o. Female) Treating RN: Curtis Sites Primary Care Physician: SYSTEM, PCP Other Clinician: Referring Physician: Julieanne Cotton Treating Physician/Extender: Rudene Re in Treatment: 4 Visit Information History Since Last Visit Added or deleted any medications: No Patient Arrived: Walker Any new allergies or adverse reactions: No Arrival Time: 10:15 Had a fall or experienced change in No Accompanied By: dtr activities of daily living that may affect Transfer Assistance: None risk of falls: Patient Identification Verified: Yes Signs or symptoms of abuse/neglect since last No Secondary Verification Process Completed: Yes visito Hospitalized since last visit: No Pain Present Now: No Electronic Signature(s) Signed: 05/01/2016 2:42:00 PM By: Curtis Sites Entered By: Curtis Sites on 05/01/2016 10:15:55 Tara Frederick ([id number]) -------------------------------------------------------------------------------- Clinic Level of Care Assessment Details Patient Name: Tara Frederick Date of Service: 05/01/2016 10:00 AM Medical Record Number: [id number] Patient Account Number: [id number] Date of Birth/Sex: May 09, 1924 (82 y.o. Female) Treating RN: Curtis Sites Primary Care Physician: SYSTEM, PCP Other Clinician: Referring Physician: Julieanne Cotton Treating Physician/Extender: Rudene Re in Treatment: 4 Clinic Level of Care Assessment Items TOOL 4 Quantity Score []  - Use when only an EandM is performed on FOLLOW-UP visit 0 ASSESSMENTS - Nursing Assessment / Reassessment X - Reassessment of Co-morbidities (includes updates in patient status) 1 10 X - Reassessment of Adherence to Treatment Plan 1 5 ASSESSMENTS -  Wound and Skin Assessment / Reassessment X - Simple Wound Assessment / Reassessment - one wound 1 5 []  - Complex Wound Assessment / Reassessment - multiple wounds 0 []  - Dermatologic / Skin Assessment (not related to wound area) 0 ASSESSMENTS - Focused Assessment []  - Circumferential Edema Measurements - multi extremities 0 []  - Nutritional Assessment / Counseling / Intervention 0 []  - Lower Extremity Assessment (monofilament, tuning fork, pulses) 0 []  - Peripheral Arterial Disease Assessment (using hand held doppler) 0 ASSESSMENTS - Ostomy and/or Continence Assessment and Care []  - Incontinence Assessment and Management 0 []  - Ostomy Care Assessment and Management (repouching, etc.) 0 PROCESS - Coordination of Care X - Simple Patient / Family Education for ongoing care 1 15 []  - Complex (extensive) Patient / Family Education for ongoing care 0 []  - Staff obtains Chiropractor, Records, Test Results / Process Orders 0 []  - Staff telephones HHA, Nursing Homes / Clarify orders / etc 0 []  - Routine Transfer to another Facility (non-emergent condition) 0 Juul, Wetona ([id number]) []  - Routine Hospital Admission (non-emergent condition) 0 []  - New Admissions / Manufacturing engineer / Ordering NPWT, Apligraf, etc. 0 []  - Emergency Hospital Admission (emergent condition) 0 X - Simple Discharge Coordination 1 10 []  - Complex (extensive) Discharge Coordination 0 PROCESS - Special Needs []  - Pediatric / Minor Patient Management 0 []  - Isolation Patient Management 0 []  - Hearing / Language / Visual special needs 0 []  - Assessment of Community assistance (transportation, D/C planning, etc.) 0 []  - Additional assistance / Altered mentation 0 []  - Support Surface(s) Assessment (bed, cushion, seat, etc.) 0 INTERVENTIONS - Wound Cleansing / Measurement X - Simple Wound Cleansing - one wound 1 5 []  - Complex Wound Cleansing - multiple wounds 0 X - Wound Imaging (photographs - any number of wounds) 1  5 []  - Wound Tracing (instead of photographs) 0 X - Simple Wound Measurement - one wound 1 5 []  - Complex Wound Measurement -  multiple wounds 0 INTERVENTIONS - Wound Dressings X - Small Wound Dressing one or multiple wounds 1 10 []  - Medium Wound Dressing one or multiple wounds 0 []  - Large Wound Dressing one or multiple wounds 0 []  - Application of Medications - topical 0 []  - Application of Medications - injection 0 INTERVENTIONS - Miscellaneous []  - External ear exam 0 Bernard, Onita ([id number]) []  - Specimen Collection (cultures, biopsies, blood, body fluids, etc.) 0 []  - Specimen(s) / Culture(s) sent or taken to Lab for analysis 0 []  - Patient Transfer (multiple staff / Michiel Sites Lift / Similar devices) 0 []  - Simple Staple / Suture removal (25 or less) 0 []  - Complex Staple / Suture removal (26 or more) 0 []  - Hypo / Hyperglycemic Management (close monitor of Blood Glucose) 0 []  - Ankle / Brachial Index (ABI) - do not check if billed separately 0 X - Vital Signs 1 5 Has the patient been seen at the hospital within the last three years: Yes Total Score: 75 Level Of Care: New/Established - Level 2 Electronic Signature(s) Signed: 05/01/2016 2:42:00 PM By: Curtis Sites Entered By: Curtis Sites on 05/01/2016 10:31:41 Tara Frederick ([id number]) -------------------------------------------------------------------------------- Encounter Discharge Information Details Patient Name: Tara Frederick Date of Service: 05/01/2016 10:00 AM Medical Record Number: [id number] Patient Account Number: [id number] Date of Birth/Sex: 05-19-1924 (82 y.o. Female) Treating RN: Curtis Sites Primary Care Physician: SYSTEM, PCP Other Clinician: Referring Physician: Julieanne Cotton Treating Physician/Extender: Rudene Re in Treatment: 4 Encounter Discharge Information Items Discharge Pain Level: 0 Discharge Condition: Stable Ambulatory Status: Walker Discharge Destination: Nursing  Home Transportation: Private Auto Accompanied By: dtr Schedule Follow-up Appointment: Yes Medication Reconciliation completed No and provided to Patient/Care Haiven Nardone: Provided on Clinical Summary of Care: 05/01/2016 Form Type Recipient Paper Patient FH Electronic Signature(s) Signed: 05/01/2016 10:38:12 AM By: [name] Entered By: [name] on 05/01/2016 10:38:12 Tara Frederick ([id number]) -------------------------------------------------------------------------------- Multi Wound Chart Details Patient Name: Tara Frederick Date of Service: 05/01/2016 10:00 AM Medical Record Number: [id number] Patient Account Number: [id number] Date of Birth/Sex: 22-Mar-1924 (82 y.o. Female) Treating RN: Curtis Sites Primary Care Physician: SYSTEM, PCP Other Clinician: Referring Physician: Julieanne Cotton Treating Physician/Extender: Rudene Re in Treatment: 4 Vital Signs Height(in): 61 Pulse(bpm): 77 Weight(lbs): 113 Blood Pressure 125/64 (mmHg): Body Mass Index(BMI): 21 Temperature(F): 97.9 Respiratory Rate 16 (breaths/min): Photos: [1:No Photos] [N/A:N/A] Wound Location: [1:Sacrum - Midline] [N/A:N/A] Wounding Event: [1:Pressure Injury] [N/A:N/A] Primary Etiology: [1:Pressure Ulcer] [N/A:N/A] Comorbid History: [1:Cataracts, Glaucoma, Asthma, Hypertension, Osteoarthritis] [N/A:N/A] Date Acquired: [1:03/20/2016] [N/A:N/A] Weeks of Treatment: [1:4] [N/A:N/A] Wound Status: [1:Open] [N/A:N/A] Measurements L x W x D 0.6x0.5x0.2 [N/A:N/A] (cm) Area (cm) : [1:0.236] [N/A:N/A] Volume (cm) : [1:0.047] [N/A:N/A] % Reduction in Area: [1:62.40%] [N/A:N/A] % Reduction in Volume: 62.70% [N/A:N/A] Classification: [1:Category/Stage III] [N/A:N/A] Exudate Amount: [1:Large] [N/A:N/A] Exudate Type: [1:Serous] [N/A:N/A] Exudate Color: [1:amber] [N/A:N/A] Wound Margin: [1:Flat and Intact] [N/A:N/A] Granulation Amount: [1:Medium (34-66%)] [N/A:N/A] Granulation Quality:  [1:Pink] [N/A:N/A] Necrotic Amount: [1:Medium (34-66%)] [N/A:N/A] Exposed Structures: [1:Fascia: No Fat: No Tendon: No Muscle: No Joint: No Bone: No] [N/A:N/A] Limited to Skin Breakdown Epithelialization: Small (1-33%) N/A N/A Periwound Skin Texture: Edema: No N/A N/A Excoriation: No Induration: No Callus: No Crepitus: No Fluctuance: No Friable: No Rash: No Scarring: No Periwound Skin Moist: Yes N/A N/A Moisture: Maceration: No Dry/Scaly: No Periwound Skin Color: Erythema: Yes N/A N/A Atrophie Blanche: No Cyanosis: No Ecchymosis: No Hemosiderin Staining: No Mottled: No Pallor: No Rubor: No Erythema Location: Circumferential N/A N/A Temperature: No Abnormality N/A N/A Tenderness  on Yes N/A N/A Palpation: Wound Preparation: Ulcer Cleansing: N/A N/A Rinsed/Irrigated with Saline Topical Anesthetic Applied: Other: lidocaine 4% Treatment Notes Electronic Signature(s) Signed: 05/01/2016 2:42:00 PM By: Curtis Sites Entered By: Curtis Sites on 05/01/2016 10:22:07 KAYLONI, ROCCO ([id number]) -------------------------------------------------------------------------------- Multi-Disciplinary Care Plan Details Patient Name: Tara Frederick Date of Service: 05/01/2016 10:00 AM Medical Record Number: [id number] Patient Account Number: [id number] Date of Birth/Sex: 06-03-1924 (82 y.o. Female) Treating RN: Curtis Sites Primary Care Physician: SYSTEM, PCP Other Clinician: Referring Physician: Julieanne Cotton Treating Physician/Extender: Rudene Re in Treatment: 4 Active Inactive Abuse / Safety / Falls / Self Care Management Nursing Diagnoses: Impaired physical mobility Potential for falls Goals: Patient will remain injury free Date Initiated: 04/03/2016 Goal Status: Active Interventions: Assess fall risk on admission and as needed Notes: Orientation to the Wound Care Program Nursing Diagnoses: Knowledge deficit related to the wound healing center  program Goals: Patient/caregiver will verbalize understanding of the Wound Healing Center Program Date Initiated: 04/03/2016 Goal Status: Active Interventions: Provide education on orientation to the wound center Notes: Pressure Nursing Diagnoses: Knowledge deficit related to management of pressures ulcers Goals: Patient will remain free from development of additional pressure ulcers AMBERLE, LYTER ([id number]) Date Initiated: 04/03/2016 Goal Status: Active Interventions: Assess: immobility, friction, shearing, incontinence upon admission and as needed Provide education on pressure ulcers Notes: Wound/Skin Impairment Nursing Diagnoses: Impaired tissue integrity Goals: Patient/caregiver will verbalize understanding of skin care regimen Date Initiated: 04/03/2016 Goal Status: Active Ulcer/skin breakdown will have a volume reduction of 30% by week 4 Date Initiated: 04/03/2016 Goal Status: Active Ulcer/skin breakdown will have a volume reduction of 50% by week 8 Date Initiated: 04/03/2016 Goal Status: Active Ulcer/skin breakdown will have a volume reduction of 80% by week 12 Date Initiated: 04/03/2016 Goal Status: Active Ulcer/skin breakdown will heal within 14 weeks Date Initiated: 04/03/2016 Goal Status: Active Interventions: Assess patient/caregiver ability to obtain necessary supplies Assess patient/caregiver ability to perform ulcer/skin care regimen upon admission and as needed Assess ulceration(s) every visit Notes: Electronic Signature(s) Signed: 05/01/2016 2:42:00 PM By: Curtis Sites Entered By: Curtis Sites on 05/01/2016 10:21:59 Tara Frederick ([id number]) -------------------------------------------------------------------------------- Pain Assessment Details Patient Name: Tara Frederick Date of Service: 05/01/2016 10:00 AM Medical Record Number: [id number] Patient Account Number: [id number] Date of Birth/Sex: 1923-11-09 (82 y.o. Female) Treating RN: Curtis Sites Primary Care Physician: SYSTEM, PCP Other Clinician: Referring Physician: Julieanne Cotton Treating Physician/Extender: Rudene Re in Treatment: 4 Active Problems Location of Pain Severity and Description of Pain Patient Has Paino No Site Locations Pain Management and Medication Current Pain Management: Electronic Signature(s) Signed: 05/01/2016 2:42:00 PM By: Curtis Sites Entered By: Curtis Sites on 05/01/2016 10:16:02 Tara Frederick ([id number]) -------------------------------------------------------------------------------- Patient/Caregiver Education Details Patient Name: Tara Frederick Date of Service: 05/01/2016 10:00 AM Medical Record Number: [id number] Patient Account Number: [id number] Date of Birth/Gender: 11/20/1923 (82 y.o. Female) Treating RN: Curtis Sites Primary Care Physician: SYSTEM, PCP Other Clinician: Referring Physician: Julieanne Cotton Treating Physician/Extender: Rudene Re in Treatment: 4 Education Assessment Education Provided To: Patient and Caregiver Education Topics Provided Nutrition: Handouts: Nutrition Methods: Explain/Verbal Responses: State content correctly Electronic Signature(s) Signed: 05/01/2016 2:42:00 PM By: Curtis Sites Entered By: Curtis Sites on 05/01/2016 10:32:53 Tara Frederick ([id number]) -------------------------------------------------------------------------------- Wound Assessment Details Patient Name: Tara Frederick Date of Service: 05/01/2016 10:00 AM Medical Record Number: [id number] Patient Account Number: [id number] Date of Birth/Sex: June 23, 1924 (82 y.o. Female) Treating RN: Curtis Sites Primary Care Physician: SYSTEM, PCP Other Clinician: Referring Physician: Julieanne Cotton Treating Physician/Extender: Rudene Re in Treatment: 4  Wound Status Wound Number: 1 Primary Pressure Ulcer Etiology: Wound Location: Sacrum - Midline Wound Open Wounding Event: Pressure  Injury Status: Date Acquired: 03/20/2016 Comorbid Cataracts, Glaucoma, Asthma, Weeks Of Treatment: 4 History: Hypertension, Osteoarthritis Clustered Wound: No Photos Wound Measurements Length: (cm) 0.6 Width: (cm) 0.5 Depth: (cm) 0.2 Area: (cm) 0.236 Volume: (cm) 0.047 % Reduction in Area: 62.4% % Reduction in Volume: 62.7% Epithelialization: Small (1-33%) Tunneling: No Undermining: No Wound Description Classification: Category/Stage III Wound Margin: Flat and Intact Exudate Amount: Large Exudate Type: Serous Exudate Color: amber Foul Odor After Cleansing: No Wound Bed Granulation Amount: Medium (34-66%) Exposed Structure Granulation Quality: Pink Fascia Exposed: No Necrotic Amount: Medium (34-66%) Fat Layer Exposed: No Necrotic Quality: Adherent Slough Tendon Exposed: No Muscle Exposed: No Ventura, Rosealyn ([id number]) Joint Exposed: No Bone Exposed: No Limited to Skin Breakdown Periwound Skin Texture Texture Color No Abnormalities Noted: No No Abnormalities Noted: No Callus: No Atrophie Blanche: No Crepitus: No Cyanosis: No Excoriation: No Ecchymosis: No Fluctuance: No Erythema: Yes Friable: No Erythema Location: Circumferential Induration: No Hemosiderin Staining: No Localized Edema: No Mottled: No Rash: No Pallor: No Scarring: No Rubor: No Moisture Temperature / Pain No Abnormalities Noted: No Temperature: No Abnormality Dry / Scaly: No Tenderness on Palpation: Yes Maceration: No Moist: Yes Wound Preparation Ulcer Cleansing: Rinsed/Irrigated with Saline Topical Anesthetic Applied: Other: lidocaine 4%, Treatment Notes Wound #1 (Midline Sacrum) 1. Cleansed with: Clean wound with Normal Saline 2. Anesthetic Topical Lidocaine 4% cream to wound bed prior to debridement 3. Peri-wound Care: Skin Prep 4. Dressing Applied: Aquacel Ag 5. Secondary Dressing Applied Bordered Foam Dressing Electronic Signature(s) Signed: 05/01/2016 2:42:00 PM  By: Curtis Sitesorthy, Joanna Entered By: Curtis Sitesorthy, Joanna on 05/01/2016 11:07:56 Tara GermanHARDY, Jade ([id number]) -------------------------------------------------------------------------------- Vitals Details Patient Name: Tara GermanHARDY, Loleta Date of Service: 05/01/2016 10:00 AM Medical Record Number: [id number] Patient Account Number: [id number] Date of Birth/Sex: March 09, 1924 (82 y.o. Female) Treating RN: Curtis Sitesorthy, Joanna Primary Care Physician: SYSTEM, PCP Other Clinician: Referring Physician: Julieanne CottonEVESHWAR, SANJEEV Treating Physician/Extender: Rudene ReBritto, Errol Weeks in Treatment: 4 Vital Signs Time Taken: 10:16 Temperature (F): 97.9 Height (in): 61 Pulse (bpm): 77 Weight (lbs): 113 Respiratory Rate (breaths/min): 16 Body Mass Index (BMI): 21.3 Blood Pressure (mmHg): 125/64 Reference Range: 80 - 120 mg / dl Electronic Signature(s) Signed: 05/01/2016 2:42:00 PM By: Curtis Sitesorthy, Joanna Entered By: Curtis Sitesorthy, Joanna on 05/01/2016 10:16:25

## 2016-05-02 ENCOUNTER — Other Ambulatory Visit (HOSPITAL_COMMUNITY): Payer: Self-pay | Admitting: Interventional Radiology

## 2016-05-02 DIAGNOSIS — M549 Dorsalgia, unspecified: Secondary | ICD-10-CM

## 2016-05-02 DIAGNOSIS — IMO0002 Reserved for concepts with insufficient information to code with codable children: Secondary | ICD-10-CM

## 2016-05-03 ENCOUNTER — Other Ambulatory Visit: Payer: Self-pay | Admitting: Radiology

## 2016-05-04 ENCOUNTER — Other Ambulatory Visit: Payer: Self-pay | Admitting: General Surgery

## 2016-05-05 ENCOUNTER — Encounter (HOSPITAL_COMMUNITY): Payer: Self-pay

## 2016-05-05 ENCOUNTER — Ambulatory Visit (HOSPITAL_COMMUNITY)
Admission: RE | Admit: 2016-05-05 | Discharge: 2016-05-05 | Disposition: A | Discharge status: Home / Self Care | Payer: No Typology Code available for payment source | Source: Ambulatory Visit | Attending: Interventional Radiology | Admitting: Interventional Radiology

## 2016-05-05 DIAGNOSIS — Z5309 Procedure and treatment not carried out because of other contraindication: Secondary | ICD-10-CM | POA: Insufficient documentation

## 2016-05-05 DIAGNOSIS — N390 Urinary tract infection, site not specified: Secondary | ICD-10-CM | POA: Insufficient documentation

## 2016-05-05 DIAGNOSIS — M4854XA Collapsed vertebra, not elsewhere classified, thoracic region, initial encounter for fracture: Secondary | ICD-10-CM | POA: Diagnosis present

## 2016-05-05 DIAGNOSIS — IMO0002 Reserved for concepts with insufficient information to code with codable children: Secondary | ICD-10-CM

## 2016-05-05 DIAGNOSIS — M549 Dorsalgia, unspecified: Secondary | ICD-10-CM

## 2016-05-05 LAB — URINALYSIS W MICROSCOPIC (NOT AT ARMC)
Bilirubin Urine: NEGATIVE
GLUCOSE, UA: NEGATIVE mg/dL
HGB URINE DIPSTICK: NEGATIVE
Ketones, ur: NEGATIVE mg/dL
NITRITE: NEGATIVE
PH: 6.5 (ref 5.0–8.0)
Protein, ur: NEGATIVE mg/dL
RBC / HPF: NONE SEEN RBC/hpf (ref 0–5)
Specific Gravity, Urine: 1.017 (ref 1.005–1.030)

## 2016-05-05 LAB — BASIC METABOLIC PANEL
ANION GAP: 8 (ref 5–15)
BUN: 24 mg/dL — AB (ref 6–20)
CHLORIDE: 99 mmol/L — AB (ref 101–111)
CO2: 28 mmol/L (ref 22–32)
Calcium: 12 mg/dL — ABNORMAL HIGH (ref 8.9–10.3)
Creatinine, Ser: 0.84 mg/dL (ref 0.44–1.00)
GFR, EST NON AFRICAN AMERICAN: 59 mL/min — AB (ref >60)
Glucose, Bld: 108 mg/dL — ABNORMAL HIGH (ref 65–99)
POTASSIUM: 4.3 mmol/L (ref 3.5–5.1)
SODIUM: 135 mmol/L (ref 135–145)

## 2016-05-05 LAB — CBC
HEMATOCRIT: 33.9 % — AB (ref 36.0–46.0)
Hemoglobin: 10.7 g/dL — ABNORMAL LOW (ref 12.0–15.0)
MCH: 28.6 pg (ref 26.0–34.0)
MCHC: 31.6 g/dL (ref 30.0–36.0)
MCV: 90.6 fL (ref 78.0–100.0)
PLATELETS: 412 10*3/uL — AB (ref 150–400)
RBC: 3.74 MIL/uL — AB (ref 3.87–5.11)
RDW: 14.9 % (ref 11.5–15.5)
WBC: 11.2 10*3/uL — AB (ref 4.0–10.5)

## 2016-05-05 LAB — APTT: APTT: 30 s (ref 24–36)

## 2016-05-05 LAB — PROTIME-INR
INR: 1.05
Prothrombin Time: 13.7 seconds (ref 11.4–15.2)

## 2016-05-05 MED ORDER — SODIUM CHLORIDE 0.9 % IV SOLN
INTRAVENOUS | Status: DC
  Administered 2016-05-05: 09:00:00 via INTRAVENOUS

## 2016-05-05 MED ORDER — VANCOMYCIN HCL IN DEXTROSE 1-5 GM/200ML-% IV SOLN: 1000.0000 mg | INTRAVENOUS | Status: DC

## 2016-05-05 NOTE — H&P (Signed)
Chief Complaint: Patient was seen in consultation today for Thoracic 03/17/11 and Lumbar 2 kyphoplasty/vertebroplasty at the request of Dr Rod Can  Referring Physician(s): Dr Thad Ranger  Supervising Physician: Julieanne Cotton  Patient Status: Outpatient  History of Present Illness: Tara Frederick is a 82 y.o. female   Hx Lumbar 1 Verterbroplasty 03/14/2016 Pain relief somewhat but not complete Now with worsening back pain over several weeks No new injury MRI 9/20: IMPRESSION: MR THORACIC SPINE IMPRESSION 1. Despite efforts by the technologist and patient, motion artifact is present on today's examination and could not be eliminated. This reduces the sensitivity and specificity of the study. 2. Multiple thoracic compression deformities, including the T8, T10 and T12 levels. These are age indeterminate, but the T12 fracture is new compared to 03/10/2016. 3. No thoracic spinal canal stenosis.  MR LUMBAR SPINE IMPRESSION 1. Status post augmentation of L1 compression fracture. 2. L2 compression fracture is new compared 03/10/2016, but is otherwise age indeterminate.  3. Moderate L3-L4 spinal canal stenosis slightly worsened from the prior study. 4. Mild-to-moderate neural foraminal narrowing at T12-L1, L3-L4, L4-L5, unchanged  Pt scheduled today for possible VP/KP at T8/10/12 and L2  Past Medical History:  Diagnosis Date  . Asthma   . Benign essential HTN 03/04/2016  . Benign essential HTN   . GERD (gastroesophageal reflux disease)   . Glaucoma   . HTN (hypertension)     Past Surgical History:  Procedure Laterality Date  . APPENDECTOMY    . HEMORRHOID SURGERY     x2  . IR GENERIC HISTORICAL  03/14/2016   IR VERTEBROPLASTY LUMBAR BX INC UNI/BIL INC/INJECT/IMAGING 03/14/2016 Julieanne Cotton, MD [facility]  . TOTAL ABDOMINAL HYSTERECTOMY W/ BILATERAL SALPINGOOPHORECTOMY      Allergies: Toprol xl [metoprolol]; Actonel [risedronate sodium]; Biaxin  [clarithromycin]; Ceftin [cefuroxime]; Codeine; Diuretic [buchu-cornsilk-ch grass-hydran]; Evista [raloxifene]; Fosamax [alendronate sodium]; Lortab [hydrocodone-acetaminophen]; Macrobid [nitrofurantoin]; Morphine and related; Neosporin [neomycin-bacitracin zn-polymyx]; Penicillins; Prednisone; Tetracyclines & related; and Tramadol  Medications: Prior to Admission medications   Medication Sig Start Date End Date Taking? Authorizing Provider  acetaminophen (TYLENOL) 325 MG tablet Take 650 mg by mouth every 4 (four) hours as needed for mild pain.   Yes Historical Provider, MD  albuterol (PROVENTIL HFA;VENTOLIN HFA) 108 (90 Base) MCG/ACT inhaler Inhale 1 puff into the lungs every 6 (six) hours as needed for wheezing or shortness of breath.   Yes Historical Provider, MD  amLODipine (NORVASC) 5 MG tablet Take 5 mg by mouth daily.   Yes Historical Provider, MD  carboxymethylcellulose (REFRESH PLUS) 0.5 % SOLN Place 1 drop into both eyes 3 (three) times daily as needed.   Yes Historical Provider, MD  ergocalciferol (VITAMIN D2) 50000 units capsule Take 50,000 Units by mouth every 30 (thirty) days.   Yes Historical Provider, MD  erythromycin ophthalmic ointment Place 1 application into both eyes at bedtime.   Yes Historical Provider, MD  fexofenadine (ALLEGRA) 180 MG tablet Take 180 mg by mouth daily.   Yes Historical Provider, MD  latanoprost (XALATAN) 0.005 % ophthalmic solution Place 1 drop into the left eye at bedtime.   Yes Historical Provider, MD  Melatonin 3 MG TBDP Take 3 mg by mouth at bedtime as needed (sleep).   Yes Historical Provider, MD  Multiple Vitamins-Minerals (MULTIVITAMIN WITH MINERALS) tablet Take 1 tablet by mouth daily.   Yes Historical Provider, MD  naloxegol oxalate (MOVANTIK) 25 MG TABS tablet Take 12.5 mg by mouth daily.   Yes Historical Provider, MD  pantoprazole (  PROTONIX) 40 MG tablet Take 1 tablet (40 mg total) by mouth daily. 03/16/16  Yes Zannie Cove, MD  polyethylene  glycol (MIRALAX / GLYCOLAX) packet Take 17 g by mouth 3 (three) times daily. 03/16/16  Yes Zannie Cove, MD  senna-docusate (SENOKOT-S) 8.6-50 MG tablet Take 2 tablets by mouth 2 (two) times daily. 03/16/16  Yes Zannie Cove, MD  vitamin C (ASCORBIC ACID) 500 MG tablet Take 500 mg by mouth daily.   Yes Historical Provider, MD  HYDROcodone-acetaminophen (NORCO/VICODIN) 5-325 MG tablet Take 1 tablet by mouth every 6 (six) hours as needed for moderate pain. 03/16/16   Zannie Cove, MD  lidocaine (LIDODERM) 5 % Place 1 patch onto the skin daily. Remove & Discard patch within 12 hours or as directed by MD Patient not taking: Reported on 05/05/2016 03/16/16   Zannie Cove, MD     Family History  Problem Relation Age of Onset  . Cancer Sister     Unknown type  . Stomach cancer Brother   . Leukemia Brother     Social History   Social History  . Marital status: Single    Spouse name: N/A  . Number of children: N/A  . Years of education: N/A   Occupational History  . retired    Social History Main Topics  . Smoking status: Never Smoker  . Smokeless tobacco: Never Used  . Alcohol use No  . Drug use: No  . Sexual activity: Not on file   Other Topics Concern  . Not on file   Social History Narrative  . No narrative on file    Review of Systems: A 12 point ROS discussed and pertinent positives are indicated in the HPI above.  All other systems are negative.  Review of Systems  Constitutional: Positive for activity change. Negative for appetite change, fatigue and fever.  Respiratory: Negative for shortness of breath.   Gastrointestinal: Negative for abdominal pain.  Musculoskeletal: Positive for back pain and gait problem.  Neurological: Positive for weakness.  Psychiatric/Behavioral: Negative for behavioral problems and confusion.    Vital Signs: Pulse 84   Temp 97.7 F (36.5 C)   Resp 18   Ht 5\' 2"  (1.575 m)   Wt 110 lb (49.9 kg)   SpO2 100%   BMI 20.12 kg/m    Physical Exam  Cardiovascular: Normal rate, regular rhythm and normal heart sounds.   Pulmonary/Chest: Effort normal and breath sounds normal.  Abdominal: Soft. Bowel sounds are normal.  Musculoskeletal: Normal range of motion. She exhibits tenderness.  Back pain  Neurological: She is alert.  Skin: Skin is warm and dry.  Psychiatric: She has a normal mood and affect. Her behavior is normal. Judgment and thought content normal.  Nursing note and vitals reviewed.   Mallampati Score:  MD Evaluation Airway: WNL Heart: WNL Abdomen: WNL Chest/ Lungs: WNL ASA  Classification: 3 Mallampati/Airway Score: One  Imaging: Mr Thoracic Spine Wo Contrast  Result Date: 04/26/2016 CLINICAL DATA:  Back pain. EXAM: MRI THORACIC AND LUMBAR SPINE WITHOUT CONTRAST TECHNIQUE: Multiplanar and multiecho pulse sequences of the thoracic and lumbar spine were obtained without intravenous contrast. COMPARISON:  MRI L-spine 03/10/2016 FINDINGS: The examination is severely degraded by patient motion. MRI THORACIC SPINE FINDINGS Alignment: There is increased thoracic kyphosis, centered at the T5-6 level. Vertebrae: There are multiple thoracic compression deformities, at T8, T10 and T12 with approximately 20% height loss and minimal hyperintense T2 weighted signal. Cord:  Thoracic cord signal and caliber are normal. Paraspinal and  other soft tissues: Visualized thoracic paraspinal soft tissues are unremarkable. Disc levels: No thoracic spinal canal stenosis. No significant thoracic neural foraminal narrowing. MRI LUMBAR SPINE FINDINGS Segmentation:  Normal Alignment:  Straightening of the normal lumbar lordosis. Vertebrae: Post augmentation changes of the L1 vertebral body with 25% height loss, unchanged. Progressive height loss of the L2 vertebral body, approximately 25%. Conus medullaris: Extends to the L1-2 level and appears normal. Paraspinal and other soft tissues: The visualized aorta, IVC and iliac vessels are  normal. The visualized retroperitoneal organs and paraspinal soft tissues are normal. Disc levels: T12-L1: There is retropulsion of the superior posterior corner of the L1 vertebral body. Mild spinal canal stenosis. Mild-to-moderate bilateral neuroforaminal stenosis. L1-L2: Disc desiccation with small central protrusion. No spinal canal stenosis. Mild bilateral neuroforaminal stenosis. L2-L3: Disc desiccation without significant bulge. No spinal canal stenosis. No neuroforaminal stenosis. L3-L4: Disc bulge with superimposed central protrusion and ligamentum flavum redundancy. Moderate spinal canal stenosis. Mild right and moderate left neuroforaminal stenosis. L4-L5: Status post left laminotomy. Severe disc space narrowing with left subarticular protrusion narrowing the left lateral recess and contacting the descending L5 nerve root. No spinal canal stenosis. Moderate right and mild left neuroforaminal stenosis. L5-S1: Narrowing of the intervertebral disc space. No spinal canal stenosis. Mild bilateral neuroforaminal stenosis. IMPRESSION: MR THORACIC SPINE IMPRESSION 1. Despite efforts by the technologist and patient, motion artifact is present on today's examination and could not be eliminated. This reduces the sensitivity and specificity of the study. 2. Multiple thoracic compression deformities, including the T8, T10 and T12 levels. These are age indeterminate, but the T12 fracture is new compared to 03/10/2016. 3. No thoracic spinal canal stenosis. MR LUMBAR SPINE IMPRESSION 1. Status post augmentation of L1 compression fracture. 2. L2 compression fracture is new compared 03/10/2016, but is otherwise age indeterminate. 3. Moderate L3-L4 spinal canal stenosis slightly worsened from the prior study. 4. Mild-to-moderate neural foraminal narrowing at T12-L1, L3-L4, L4-L5, unchanged Electronically Signed   By: Deatra Robinson M.D.   On: 04/26/2016 22:02   Mr Lumbar Spine Wo Contrast  Result Date:  04/26/2016 CLINICAL DATA:  Back pain. EXAM: MRI THORACIC AND LUMBAR SPINE WITHOUT CONTRAST TECHNIQUE: Multiplanar and multiecho pulse sequences of the thoracic and lumbar spine were obtained without intravenous contrast. COMPARISON:  MRI L-spine 03/10/2016 FINDINGS: The examination is severely degraded by patient motion. MRI THORACIC SPINE FINDINGS Alignment: There is increased thoracic kyphosis, centered at the T5-6 level. Vertebrae: There are multiple thoracic compression deformities, at T8, T10 and T12 with approximately 20% height loss and minimal hyperintense T2 weighted signal. Cord:  Thoracic cord signal and caliber are normal. Paraspinal and other soft tissues: Visualized thoracic paraspinal soft tissues are unremarkable. Disc levels: No thoracic spinal canal stenosis. No significant thoracic neural foraminal narrowing. MRI LUMBAR SPINE FINDINGS Segmentation:  Normal Alignment:  Straightening of the normal lumbar lordosis. Vertebrae: Post augmentation changes of the L1 vertebral body with 25% height loss, unchanged. Progressive height loss of the L2 vertebral body, approximately 25%. Conus medullaris: Extends to the L1-2 level and appears normal. Paraspinal and other soft tissues: The visualized aorta, IVC and iliac vessels are normal. The visualized retroperitoneal organs and paraspinal soft tissues are normal. Disc levels: T12-L1: There is retropulsion of the superior posterior corner of the L1 vertebral body. Mild spinal canal stenosis. Mild-to-moderate bilateral neuroforaminal stenosis. L1-L2: Disc desiccation with small central protrusion. No spinal canal stenosis. Mild bilateral neuroforaminal stenosis. L2-L3: Disc desiccation without significant bulge. No spinal canal stenosis. No neuroforaminal  stenosis. L3-L4: Disc bulge with superimposed central protrusion and ligamentum flavum redundancy. Moderate spinal canal stenosis. Mild right and moderate left neuroforaminal stenosis. L4-L5: Status post left  laminotomy. Severe disc space narrowing with left subarticular protrusion narrowing the left lateral recess and contacting the descending L5 nerve root. No spinal canal stenosis. Moderate right and mild left neuroforaminal stenosis. L5-S1: Narrowing of the intervertebral disc space. No spinal canal stenosis. Mild bilateral neuroforaminal stenosis. IMPRESSION: MR THORACIC SPINE IMPRESSION 1. Despite efforts by the technologist and patient, motion artifact is present on today's examination and could not be eliminated. This reduces the sensitivity and specificity of the study. 2. Multiple thoracic compression deformities, including the T8, T10 and T12 levels. These are age indeterminate, but the T12 fracture is new compared to 03/10/2016. 3. No thoracic spinal canal stenosis. MR LUMBAR SPINE IMPRESSION 1. Status post augmentation of L1 compression fracture. 2. L2 compression fracture is new compared 03/10/2016, but is otherwise age indeterminate. 3. Moderate L3-L4 spinal canal stenosis slightly worsened from the prior study. 4. Mild-to-moderate neural foraminal narrowing at T12-L1, L3-L4, L4-L5, unchanged Electronically Signed   By: Deatra RobinsonKevin  Herman M.D.   On: 04/26/2016 22:02    Labs:  CBC:  Recent Labs  03/12/16 0627 03/13/16 0534 03/14/16 0658 05/05/16 0815  WBC 10.1 11.1* 8.8 11.2*  HGB 11.0* 10.9* 10.5* 10.7*  HCT 32.3* 32.5* 32.0* 33.9*  PLT 314 400 409* 412*    COAGS:  Recent Labs  03/12/16 0627  INR 1.16  APTT 41*    BMP:  Recent Labs  03/08/16 0909 03/11/16 0521 03/12/16 0627 03/13/16 0534  NA 131* 132* 130* 133*  K 3.5 2.8* 2.8* 4.8  CL 96* 89* 88* 97*  CO2 23 30 30 28   GLUCOSE 98 91 101* 95  BUN 8 8 21* 19  CALCIUM 9.8 10.5* 10.2 10.4*  CREATININE 0.74 0.72 1.22* 0.98  GFRNONAA >60 >60 38* 50*  GFRAA >60 >60 44* 58*    LIVER FUNCTION TESTS: No results for input(s): BILITOT, AST, ALT, ALKPHOS, PROT, ALBUMIN in the last 8760 hours.  TUMOR MARKERS: No results for  input(s): AFPTM, CEA, CA199, CHROMGRNA in the last 8760 hours.  Assessment and Plan:  New and worsening back pain x weeks MRI reveals acute fxs at T8/10/12 and L2 Now scheduled for vertebroplasty/kyphoplasty  Risks and Benefits discussed with the patient including, but not limited to education regarding the natural healing process of compression fractures without intervention, bleeding, infection, cement migration which may cause spinal cord damage, paralysis, pulmonary embolism or even death. All of the patient's questions were answered, patient is agreeable to proceed. Consent signed and in chart.   Thank you for this interesting consult.  I greatly enjoyed meeting Tara Frederick and look forward to participating in their care.  A copy of this report was sent to the requesting provider on this date.  Electronically Signed: Jamis Kryder A 05/05/2016, 8:53 AM   I spent a total of  30 Minutes   in face to face in clinical consultation, greater than 50% of which was counseling/coordinating care for T8/10/12 and L1 VP/KP

## 2016-05-08 ENCOUNTER — Encounter: Payer: Medicare Other | Attending: Nurse Practitioner | Admitting: Nurse Practitioner

## 2016-05-08 DIAGNOSIS — Z79899 Other long term (current) drug therapy: Secondary | ICD-10-CM | POA: Insufficient documentation

## 2016-05-08 DIAGNOSIS — J45909 Unspecified asthma, uncomplicated: Secondary | ICD-10-CM | POA: Diagnosis not present

## 2016-05-08 DIAGNOSIS — Z880 Allergy status to penicillin: Secondary | ICD-10-CM | POA: Insufficient documentation

## 2016-05-08 DIAGNOSIS — I10 Essential (primary) hypertension: Secondary | ICD-10-CM | POA: Diagnosis not present

## 2016-05-08 DIAGNOSIS — K219 Gastro-esophageal reflux disease without esophagitis: Secondary | ICD-10-CM | POA: Diagnosis not present

## 2016-05-08 DIAGNOSIS — X58XXXD Exposure to other specified factors, subsequent encounter: Secondary | ICD-10-CM | POA: Insufficient documentation

## 2016-05-08 DIAGNOSIS — S32010D Wedge compression fracture of first lumbar vertebra, subsequent encounter for fracture with routine healing: Secondary | ICD-10-CM | POA: Diagnosis not present

## 2016-05-08 DIAGNOSIS — M1990 Unspecified osteoarthritis, unspecified site: Secondary | ICD-10-CM | POA: Insufficient documentation

## 2016-05-08 DIAGNOSIS — L89153 Pressure ulcer of sacral region, stage 3: Secondary | ICD-10-CM | POA: Diagnosis present

## 2016-05-09 NOTE — Progress Notes (Signed)
Tara Frederick, Zetta ([id number]) Visit Report for 05/08/2016 Arrival Information Details Patient Name: Tara Frederick, [name] Date of Service: 05/08/2016 10:00 AM Medical Record Number: [id number] Patient Account Number: [id number] Date of Birth/Sex: Mar 13, 1924 (82 y.o. Female) Treating RN: Curtis Sitesorthy, Joanna Primary Care Physician: SYSTEM, PCP Other Clinician: Referring Physician: Julieanne CottonEVESHWAR, SANJEEV Treating Physician/Extender: [name] GarnetSaunders, Sharon Weeks in Treatment: 5 Visit Information History Since Last Visit Added or deleted any medications: No Patient Arrived: Walker Any new allergies or adverse reactions: No Arrival Time: 10:07 Had a fall or experienced change in No Accompanied By: dtr activities of daily living that may affect Transfer Assistance: None risk of falls: Patient Identification Verified: Yes Signs or symptoms of abuse/neglect since last No Secondary Verification Process Completed: Yes visito Hospitalized since last visit: No Pain Present Now: No Electronic Signature(s) Signed: 05/08/2016 5:16:43 PM By: Curtis Sitesorthy, Joanna Entered By: Curtis Sitesorthy, Joanna on 05/08/2016 10:12:02 Tara Frederick, Shaquanta ([id number]) -------------------------------------------------------------------------------- Encounter Discharge Information Details Patient Name: Tara Frederick, [name] Date of Service: 05/08/2016 10:00 AM Medical Record Number: [id number] Patient Account Number: [id number] Date of Birth/Sex: Mar 13, 1924 (82 y.o. Female) Treating RN: Curtis Sitesorthy, Joanna Primary Care Physician: SYSTEM, PCP Other Clinician: Referring Physician: Julieanne CottonEVESHWAR, SANJEEV Treating Physician/Extender: [name] GarnetSaunders, Sharon Weeks in Treatment: 5 Encounter Discharge Information Items Discharge Pain Level: 0 Discharge Condition: Stable Ambulatory Status: Walker Discharge Destination: Nursing Home Transportation: Private Auto Accompanied By: dtr Schedule Follow-up Appointment: Yes Medication Reconciliation completed No and provided to  Patient/Care Zaeem Kandel: Provided on Clinical Summary of Care: 05/08/2016 Form Type Recipient Paper Patient FH Electronic Signature(s) Signed: 05/08/2016 12:22:02 PM By: Curtis Sitesorthy, Joanna Previous Signature: 05/08/2016 10:33:01 AM Version By: Gwenlyn PerkingMoore, Shelia Entered By: Curtis Sitesorthy, Joanna on 05/08/2016 12:22:02 Tara Frederick, Saara ([id number]) -------------------------------------------------------------------------------- Multi Wound Chart Details Patient Name: Tara Frederick, [name] Date of Service: 05/08/2016 10:00 AM Medical Record Number: [id number] Patient Account Number: [id number] Date of Birth/Sex: Mar 13, 1924 (82 y.o. Female) Treating RN: Curtis Sitesorthy, Joanna Primary Care Physician: SYSTEM, PCP Other Clinician: Referring Physician: Julieanne CottonEVESHWAR, SANJEEV Treating Physician/Extender: [name] GarnetSaunders, Sharon Weeks in Treatment: 5 Vital Signs Height(in): 61 Pulse(bpm): 78 Weight(lbs): 113 Blood Pressure 106/75 (mmHg): Body Mass Index(BMI): 21 Temperature(F): 98.0 Respiratory Rate 16 (breaths/min): Photos: [1:No Photos] [N/A:N/A] Wound Location: [1:Sacrum - Midline] [N/A:N/A] Wounding Event: [1:Pressure Injury] [N/A:N/A] Primary Etiology: [1:Pressure Ulcer] [N/A:N/A] Comorbid History: [1:Cataracts, Glaucoma, Asthma, Hypertension, Osteoarthritis] [N/A:N/A] Date Acquired: [1:03/20/2016] [N/A:N/A] Weeks of Treatment: [1:5] [N/A:N/A] Wound Status: [1:Open] [N/A:N/A] Measurements L x W x D 0.5x0.4x0.2 [N/A:N/A] (cm) Area (cm) : [1:0.157] [N/A:N/A] Volume (cm) : [1:0.031] [N/A:N/A] % Reduction in Area: [1:75.00%] [N/A:N/A] % Reduction in Volume: 75.40% [N/A:N/A] Classification: [1:Category/Stage III] [N/A:N/A] Exudate Amount: [1:Large] [N/A:N/A] Exudate Type: [1:Serous] [N/A:N/A] Exudate Color: [1:amber] [N/A:N/A] Wound Margin: [1:Flat and Intact] [N/A:N/A] Granulation Amount: [1:Medium (34-66%)] [N/A:N/A] Granulation Quality: [1:Pink] [N/A:N/A] Necrotic Amount: [1:Medium (34-66%)] [N/A:N/A] Exposed  Structures: [1:Fascia: No Fat: No Tendon: No Muscle: No Joint: No Bone: No] [N/A:N/A] Limited to Skin Breakdown Epithelialization: Small (1-33%) N/A N/A Periwound Skin Texture: Edema: No N/A N/A Excoriation: No Induration: No Callus: No Crepitus: No Fluctuance: No Friable: No Rash: No Scarring: No Periwound Skin Moist: Yes N/A N/A Moisture: Maceration: No Dry/Scaly: No Periwound Skin Color: Erythema: Yes N/A N/A Atrophie Blanche: No Cyanosis: No Ecchymosis: No Hemosiderin Staining: No Mottled: No Pallor: No Rubor: No Erythema Location: Circumferential N/A N/A Temperature: No Abnormality N/A N/A Tenderness on Yes N/A N/A Palpation: Wound Preparation: Ulcer Cleansing: N/A N/A Rinsed/Irrigated with Saline Topical Anesthetic Applied: Other: lidocaine 4% Treatment Notes Electronic Signature(s) Signed: 05/08/2016 5:16:43 PM By: Curtis Sitesorthy, Joanna Entered By: Curtis Sitesorthy, Joanna on 05/08/2016 10:24:50 Arington,  Aseel ([id number]) -------------------------------------------------------------------------------- Multi-Disciplinary Care Plan Details Patient Name: [name], [name] Date of Service: 05/08/2016 10:00 AM Medical Record Number: [id number] Patient Account Number: [id number] Date of Birth/Sex: 02/10/1924 (82 y.o. Female) Treating RN: Curtis Sites Primary Care Physician: SYSTEM, PCP Other Clinician: Referring Physician: Julieanne Cotton Treating Physician/Extender: [name] Garnet in Treatment: 5 Active Inactive Abuse / Safety / Falls / Self Care Management Nursing Diagnoses: Impaired physical mobility Potential for falls Goals: Patient will remain injury free Date Initiated: 04/03/2016 Goal Status: Active Interventions: Assess fall risk on admission and as needed Notes: Orientation to the Wound Care Program Nursing Diagnoses: Knowledge deficit related to the wound healing center program Goals: Patient/caregiver will verbalize understanding of the Wound  Healing Center Program Date Initiated: 04/03/2016 Goal Status: Active Interventions: Provide education on orientation to the wound center Notes: Pressure Nursing Diagnoses: Knowledge deficit related to management of pressures ulcers Goals: Patient will remain free from development of additional pressure ulcers MALEEHA, HALLS ([id number]) Date Initiated: 04/03/2016 Goal Status: Active Interventions: Assess: immobility, friction, shearing, incontinence upon admission and as needed Provide education on pressure ulcers Notes: Wound/Skin Impairment Nursing Diagnoses: Impaired tissue integrity Goals: Patient/caregiver will verbalize understanding of skin care regimen Date Initiated: 04/03/2016 Goal Status: Active Ulcer/skin breakdown will have a volume reduction of 30% by week 4 Date Initiated: 04/03/2016 Goal Status: Active Ulcer/skin breakdown will have a volume reduction of 50% by week 8 Date Initiated: 04/03/2016 Goal Status: Active Ulcer/skin breakdown will have a volume reduction of 80% by week 12 Date Initiated: 04/03/2016 Goal Status: Active Ulcer/skin breakdown will heal within 14 weeks Date Initiated: 04/03/2016 Goal Status: Active Interventions: Assess patient/caregiver ability to obtain necessary supplies Assess patient/caregiver ability to perform ulcer/skin care regimen upon admission and as needed Assess ulceration(s) every visit Notes: Electronic Signature(s) Signed: 05/08/2016 5:16:43 PM By: Curtis Sites Entered By: Curtis Sites on 05/08/2016 10:24:15 Tara German ([id number]) -------------------------------------------------------------------------------- Pain Assessment Details Patient Name: Tara German Date of Service: 05/08/2016 10:00 AM Medical Record Number: [id number] Patient Account Number: [id number] Date of Birth/Sex: 1923-10-20 (82 y.o. Female) Treating RN: Curtis Sites Primary Care Physician: SYSTEM, PCP Other Clinician: Referring Physician:  Julieanne Cotton Treating Physician/Extender: [name] Garnet in Treatment: 5 Active Problems Location of Pain Severity and Description of Pain Patient Has Paino No Site Locations Pain Management and Medication Current Pain Management: Notes Topical or injectable lidocaine is offered to patient for acute pain when surgical debridement is performed. If needed, Patient is instructed to use over the counter pain medication for the following 24-48 hours after debridement. Wound care MDs do not prescribed pain medications. Patient has chronic pain or uncontrolled pain. Patient has been instructed to make an appointment with their Primary Care Physician for pain management. Electronic Signature(s) Signed: 05/08/2016 5:16:43 PM By: Curtis Sites Entered By: Curtis Sites on 05/08/2016 10:12:21 Tara German ([id number]) -------------------------------------------------------------------------------- Patient/Caregiver Education Details Patient Name: Tara German Date of Service: 05/08/2016 10:00 AM Medical Record Number: [id number] Patient Account Number: [id number] Date of Birth/Gender: Jul 08, 1924 (82 y.o. Female) Treating RN: Curtis Sites Primary Care Physician: SYSTEM, PCP Other Clinician: Referring Physician: Julieanne Cotton Treating Physician/Extender: [name] Garnet in Treatment: 5 Education Assessment Education Provided To: Patient and Caregiver Education Topics Provided Offloading: Handouts: Other: stay off bottom Methods: Explain/Verbal Responses: State content correctly Electronic Signature(s) Signed: 05/08/2016 5:16:43 PM By: Curtis Sites Entered By: Curtis Sites on 05/08/2016 12:22:30 Tara German ([id number]) -------------------------------------------------------------------------------- Wound Assessment Details Patient Name: Tara German Date of Service: 05/08/2016 10:00 AM Medical Record Number: [id number] Patient Account Number:  [id number] Date of Birth/Sex: 17-Oct-1923 (82 y.o. Female) Treating RN: Curtis Sites Primary Care Physician: SYSTEM, PCP Other Clinician: Referring Physician: Julieanne Cotton Treating Physician/Extender: [name] Garnet in Treatment: 5 Wound Status Wound Number: 1 Primary Pressure Ulcer Etiology: Wound Location: Sacrum - Midline Wound Open Wounding Event: Pressure Injury Status: Date Acquired: 03/20/2016 Comorbid Cataracts, Glaucoma, Asthma, Weeks Of Treatment: 5 History: Hypertension, Osteoarthritis Clustered Wound: No Photos Wound Measurements Length: (cm) 0.5 Width: (cm) 0.4 Depth: (cm) 0.2 Area: (cm) 0.157 Volume: (cm) 0.031 % Reduction in Area: 75% % Reduction in Volume: 75.4% Epithelialization: Small (1-33%) Tunneling: No Undermining: No Wound Description Classification: Category/Stage III Wound Margin: Flat and Intact Exudate Amount: Large Exudate Type: Serous Exudate Color: amber Foul Odor After Cleansing: No Wound Bed Granulation Amount: Medium (34-66%) Exposed Structure Granulation Quality: Pink Fascia Exposed: No Necrotic Amount: Medium (34-66%) Fat Layer Exposed: No Necrotic Quality: Adherent Slough Tendon Exposed: No Muscle Exposed: No Mcfetridge, Buffi ([id number]) Joint Exposed: No Bone Exposed: No Limited to Skin Breakdown Periwound Skin Texture Texture Color No Abnormalities Noted: No No Abnormalities Noted: No Callus: No Atrophie Blanche: No Crepitus: No Cyanosis: No Excoriation: No Ecchymosis: No Fluctuance: No Erythema: Yes Friable: No Erythema Location: Circumferential Induration: No Hemosiderin Staining: No Localized Edema: No Mottled: No Rash: No Pallor: No Scarring: No Rubor: No Moisture Temperature / Pain No Abnormalities Noted: No Temperature: No Abnormality Dry / Scaly: No Tenderness on Palpation: Yes Maceration: No Moist: Yes Wound Preparation Ulcer Cleansing: Rinsed/Irrigated with Saline Topical  Anesthetic Applied: Other: lidocaine 4%, Treatment Notes Wound #1 (Midline Sacrum) 1. Cleansed with: Clean wound with Normal Saline 2. Anesthetic Topical Lidocaine 4% cream to wound bed prior to debridement 4. Dressing Applied: Aquacel Ag 5. Secondary Dressing Applied Bordered Foam Dressing Electronic Signature(s) Signed: 05/08/2016 5:16:43 PM By: Curtis Sites Entered By: Curtis Sites on 05/08/2016 11:32:02 JAKIA, KENNEBREW ([id number]) -------------------------------------------------------------------------------- Vitals Details Patient Name: Tara German Date of Service: 05/08/2016 10:00 AM Medical Record Number: [id number] Patient Account Number: [id number] Date of Birth/Sex: 08-21-1923 (82 y.o. Female) Treating RN: Curtis Sites Primary Care Physician: SYSTEM, PCP Other Clinician: Referring Physician: Julieanne Cotton Treating Physician/Extender: [name] Garnet in Treatment: 5 Vital Signs Time Taken: 10:12 Temperature (F): 98.0 Height (in): 61 Pulse (bpm): 78 Weight (lbs): 113 Respiratory Rate (breaths/min): 16 Body Mass Index (BMI): 21.3 Blood Pressure (mmHg): 106/75 Reference Range: 80 - 120 mg / dl Electronic Signature(s) Signed: 05/08/2016 5:16:43 PM By: Curtis Sites Entered By: Curtis Sites on 05/08/2016 10:12:41

## 2016-05-09 NOTE — Progress Notes (Addendum)
Tara, Frederick ([id number]) Visit Report for 05/08/2016 Chief Complaint Document Details Patient Name: Tara Frederick, Tara Frederick 05/08/2016 10:00 Date of Service: AM Medical Record [id number] Number: Patient Account Number: [id number] 04/15/1924 (82 y.o. Treating RN: Curtis Sites Date of Birth/Sex: Female) Other Clinician: Primary Care Physician: SYSTEM, PCP Treating Georges Lynch Referring Physician: Julieanne Cotton Physician/Extender: Tania Ade in Treatment: 5 Information Obtained from: Patient Chief Complaint Patient is at the clinic for treatment of an open pressure ulcer to the sacral region for about 2 weeks Electronic Signature(s) Signed: 05/08/2016 4:03:42 PM By: Georges Lynch FNP Entered By: Georges Lynch on 05/08/2016 10:25:34 [name] ([id number]) -------------------------------------------------------------------------------- Debridement Details Patient Name: Tara, Frederick 05/08/2016 10:00 Date of Service: AM Medical Record [id number] Number: Patient Account Number: [id number] February 10, 1924 (82 y.o. Treating RN: Curtis Sites Date of Birth/Sex: Female) Other Clinician: Primary Care Physician: SYSTEM, PCP Treating Georges Lynch Referring Physician: Julieanne Cotton Physician/Extender: Tania Ade in Treatment: 5 Debridement Performed for Wound #1 Midline Sacrum Assessment: Performed By: Physician Georges Lynch, NP Debridement: Debridement Pre-procedure Yes - 10:23 Verification/Time Out Taken: Start Time: 10:23 Pain Control: Lidocaine 4% Topical Solution Level: Skin/Subcutaneous Tissue Total Area Debrided (L x 0.5 (cm) x 0.4 (cm) = 0.2 (cm) W): Tissue and other Viable, Non-Viable, Fibrin/Slough, Subcutaneous material debrided: Instrument: Curette Bleeding: Minimum Hemostasis Achieved: Pressure End Time: 10:25 Procedural Pain: 0 Post Procedural Pain: 0 Response to Treatment: Procedure was tolerated well Post Debridement Measurements of Total  Wound Length: (cm) 0.5 Stage: Category/Stage III Width: (cm) 0.4 Depth: (cm) 0.3 Volume: (cm) 0.047 Character of Wound/Ulcer Post Improved Debridement: Severity of Tissue Post Fat layer exposed Debridement: Post Procedure Diagnosis Same as Pre-procedure Electronic Signature(s) Signed: 05/08/2016 4:03:42 PM By: Georges Lynch FNP [place], Khalaya ([id number]) Signed: 05/08/2016 5:16:43 PM By: Curtis Sites Entered By: Curtis Sites on 05/08/2016 10:25:46 RIDHIMA, GOLBERG ([id number]) -------------------------------------------------------------------------------- HPI Details Patient Name: Tara, Frederick 05/08/2016 10:00 Date of Service: AM Medical Record [id number] Number: Patient Account Number: [id number] 02-18-1924 (82 y.o. Treating RN: Curtis Sites Date of Birth/Sex: Female) Other Clinician: Primary Care Physician: SYSTEM, PCP Treating Georges Lynch Referring Physician: Julieanne Cotton Physician/Extender: Tania Ade in Treatment: 5 History of Present Illness Location: sacral region Quality: Patient reports experiencing a sharp pain to affected area(s). Severity: Patient states wound are getting worse. Duration: Patient has had the wound for < 2 weeks prior to presenting for treatment Timing: Pain in wound is Intermittent (comes and goes Context: The wound would happen gradually Modifying Factors: Other treatment(s) tried include:since L1 compression fracturesurgery for a Associated Signs and Symptoms: Patient reports having difficulty standing for long periods. HPI Description: 82 year old patient who was recently admitted to [facility] on July 28 was discharged on August 10. She was treated for severe constipation, L1 compression fracture and is status post kyphoplasty on 8/8. Also treated for a UTI with Levaquin and continued on supportive care. Past medical history significant for hypertension, GERD, degenerative disc disease. Status post appendectomy,  hemorrhoid surgery and a total abdominal hysterectomy with bilateral salpingo- oophorectomy. Present she is in a rehabilitation facility and ambulating with a walker. Her protein intake is said to be good and she is on vitamin supplements. She has an appropriate bed surface. 05/08/16: pt returns today for ongoing eval and management of a sacrum PU. wound is improving. no reports of systemic s/s of infection. she is accompanied by a family member today. no new wounds or skin breakdown. Electronic Signature(s) Signed: 05/08/2016 4:03:42 PM By: Georges Lynch FNP Entered By: Georges Lynch on 05/08/2016 10:27:49 Garrels, [name] ([id number]) --------------------------------------------------------------------------------  Physical Exam Details Patient Name: Tara, Frederick 05/08/2016 10:00 Date of Service: AM Medical Record [id number] Number: Patient Account Number: [id number] 1924/03/13 (82 y.o. Treating RN: Curtis Sites Date of Birth/Sex: Female) Other Clinician: Primary Care Physician: SYSTEM, PCP Treating Georges Lynch Referring Physician: Julieanne Cotton Physician/Extender: Tania Ade in Treatment: 5 Constitutional Patient's appearance is neat and clean. Appears in no acute distress. Well nourished and well developed.. Eyes Conjunctivae clear. No discharge.. Ears, Nose, Mouth, and Throat External ears and nose are within normal limits No lesions present.Marland Kitchen Respiratory Respiratory effort is easy and symmetric bilaterally. Rate is normal at rest and on room air.Marland Kitchen Psychiatric No evidence of depression, anxiety, or agitation. Calm, cooperative, and communicative. Appropriate interactions and affect.. Electronic Signature(s) Signed: 05/08/2016 4:03:42 PM By: Georges Lynch FNP Entered By: Georges Lynch on 05/08/2016 10:27:10 ANNALIZ, AVEN ([id number]) -------------------------------------------------------------------------------- Physician Orders Details Patient Name: Frederick, Tara 05/08/2016 10:00 Date of Service: AM Medical Record [id number] Number: Patient Account Number: [id number] [birth year] (82 y.o. Treating RN: Curtis Sites Date of Birth/Sex: Female) Other Clinician: Primary Care Physician: SYSTEM, PCP Treating Georges Lynch Referring Physician: Julieanne Cotton Physician/Extender: Tania Ade in Treatment: 5 Verbal / Phone Orders: Yes Clinician: Curtis Sites Read Back and Verified: Yes Diagnosis Coding ICD-10 Coding Code Description L89.153 Pressure ulcer of sacral region, stage 3 Wedge compression fracture of first lumbar vertebra, subsequent encounter for fracture S32.010D with routine healing Wound Cleansing Wound #1 Midline Sacrum o Clean wound with Normal Saline. Anesthetic Wound #1 Midline Sacrum o Topical Lidocaine 4% cream applied to wound bed prior to debridement Skin Barriers/Peri-Wound Care Wound #1 Midline Sacrum o Skin Prep Primary Wound Dressing Wound #1 Midline Sacrum o Aquacel Ag - or silver alginate Secondary Dressing Wound #1 Midline Sacrum o Boardered Foam Dressing Dressing Change Frequency Wound #1 Midline Sacrum o Change dressing every day. Follow-up Appointments Wound #1 Midline Sacrum o Return Appointment in 1 week. CHERLYNN, POPIEL ([id number]) Off-Loading Wound #1 Midline Sacrum o Turn and reposition every 2 hours Additional Orders / Instructions Wound #1 Midline Sacrum o Increase protein intake. Medications-please add to medication list. Wound #1 Midline Sacrum o Other: - Please add vitamin C and zinc and vitamin A to patient's medications daily Electronic Signature(s) Signed: 05/08/2016 4:03:42 PM By: Georges Lynch FNP Signed: 05/08/2016 5:16:43 PM By: Curtis Sites Entered By: Curtis Sites on 05/08/2016 10:26:15 BAILEE, METTER ([id number]) -------------------------------------------------------------------------------- Problem List Details Patient Name: RASHAWNDA, GABA  05/08/2016 10:00 Date of Service: AM Medical Record [id number] Number: Patient Account Number: [id number] [birth year] (82 y.o. Treating RN: Curtis Sites Date of Birth/Sex: Female) Other Clinician: Primary Care Physician: SYSTEM, PCP Treating Georges Lynch Referring Physician: Julieanne Cotton Physician/Extender: Tania Ade in Treatment: 5 Active Problems ICD-10 Encounter Code Description Active Date Diagnosis L89.153 Pressure ulcer of sacral region, stage 3 04/03/2016 Yes S32.010D Wedge compression fracture of first lumbar vertebra, 04/03/2016 Yes subsequent encounter for fracture with routine healing Inactive Problems Resolved Problems Electronic Signature(s) Signed: 05/08/2016 4:03:42 PM By: Georges Lynch FNP Entered By: Georges Lynch on 05/08/2016 10:25:24 [name] ([id number]) -------------------------------------------------------------------------------- Progress Note Details Patient Name: SUMMERLYN, FICKEL 05/08/2016 10:00 Date of Service: AM Medical Record [id number] Number: Patient Account Number: [id number] [birth year] (82 y.o. Treating RN: Curtis Sites Date of Birth/Sex: Female) Other Clinician: Primary Care Physician: SYSTEM, PCP Treating Georges Lynch Referring Physician: Julieanne Cotton Physician/Extender: Tania Ade in Treatment: 5 Subjective Chief Complaint Information obtained from Patient Patient is at the clinic for treatment of an open pressure ulcer to the sacral region for about 2 weeks History of Present Illness (HPI)  The following HPI elements were documented for the patient's wound: Location: sacral region Quality: Patient reports experiencing a sharp pain to affected area(s). Severity: Patient states wound are getting worse. Duration: Patient has had the wound for < 2 weeks prior to presenting for treatment Timing: Pain in wound is Intermittent (comes and goes Context: The wound would happen gradually Modifying Factors: Other  treatment(s) tried include:since L1 compression fracturesurgery for a Associated Signs and Symptoms: Patient reports having difficulty standing for long periods. 82 year old patient who was recently admitted to [facility] on July 28 was discharged on August 10. She was treated for severe constipation, L1 compression fracture and is status post kyphoplasty on 8/8. Also treated for a UTI with Levaquin and continued on supportive care. Past medical history significant for hypertension, GERD, degenerative disc disease. Status post appendectomy, hemorrhoid surgery and a total abdominal hysterectomy with bilateral salpingo- oophorectomy. Present she is in a rehabilitation facility and ambulating with a walker. Her protein intake is said to be good and she is on vitamin supplements. She has an appropriate bed surface. 05/08/16: pt returns today for ongoing eval and management of a sacrum PU. wound is improving. no reports of systemic s/s of infection. she is accompanied by a family member today. no new wounds or skin breakdown. Objective Constitutional Balderson, Geraline ([id number]) Patient's appearance is neat and clean. Appears in no acute distress. Well nourished and well developed.. Vitals Time Taken: 10:12 AM, Height: 61 in, Weight: 113 lbs, BMI: 21.3, Temperature: 98.0 F, Pulse: 78 bpm, Respiratory Rate: 16 breaths/min, Blood Pressure: 106/75 mmHg. Eyes Conjunctivae clear. No discharge.. Ears, Nose, Mouth, and Throat External ears and nose are within normal limits No lesions present.Marland Kitchen Respiratory Respiratory effort is easy and symmetric bilaterally. Rate is normal at rest and on room air.Marland Kitchen Psychiatric No evidence of depression, anxiety, or agitation. Calm, cooperative, and communicative. Appropriate interactions and affect.. Integumentary (Hair, Skin) Wound #1 status is Open. Original cause of wound was Pressure Injury. The wound is located on the Midline Sacrum. The wound  measures 0.5cm length x 0.4cm width x 0.2cm depth; 0.157cm^2 area and 0.031cm^3 volume. The wound is limited to skin breakdown. There is no tunneling or undermining noted. There is a large amount of serous drainage noted. The wound margin is flat and intact. There is medium (34-66%) pink granulation within the wound bed. There is a medium (34-66%) amount of necrotic tissue within the wound bed including Adherent Slough. The periwound skin appearance exhibited: Moist, Erythema. The periwound skin appearance did not exhibit: Callus, Crepitus, Excoriation, Fluctuance, Friable, Induration, Localized Edema, Rash, Scarring, Dry/Scaly, Maceration, Atrophie Blanche, Cyanosis, Ecchymosis, Hemosiderin Staining, Mottled, Pallor, Rubor. The surrounding wound skin color is noted with erythema which is circumferential. Periwound temperature was noted as No Abnormality. The periwound has tenderness on palpation. Assessment Active Problems ICD-10 L89.153 - Pressure ulcer of sacral region, stage 3 S32.010D - Wedge compression fracture of first lumbar vertebra, subsequent encounter for fracture with routine healing Diagnoses ICD-10 L89.153: Pressure ulcer of sacral region, stage 3 Hautala, Tamella ([id number]) S32.010D: Wedge compression fracture of first lumbar vertebra, subsequent encounter for fracture with routine healing Procedures Wound #1 Wound #1 is a Pressure Ulcer located on the Midline Sacrum . There was a Skin/Subcutaneous Tissue Debridement ([id number]) debridement with total area of 0.2 sq cm performed by Georges Lynch, NP. with the following instrument(s): Curette to remove Viable and Non-Viable tissue/material including Fibrin/Slough and Subcutaneous after achieving pain control using Lidocaine 4% Topical Solution. A time out  was conducted at 10:23, prior to the start of the procedure. A Minimum amount of bleeding was controlled with Pressure. The procedure was tolerated well with a pain  level of 0 throughout and a pain level of 0 following the procedure. Post Debridement Measurements: 0.5cm length x 0.4cm width x 0.3cm depth; 0.047cm^3 volume. Post debridement Stage noted as Category/Stage III. Character of Wound/Ulcer Post Debridement is improved. Severity of Tissue Post Debridement is: Fat layer exposed. Post procedure Diagnosis Wound #1: Same as Pre-Procedure Plan Wound Cleansing: Wound #1 Midline Sacrum: Clean wound with Normal Saline. Anesthetic: Wound #1 Midline Sacrum: Topical Lidocaine 4% cream applied to wound bed prior to debridement Skin Barriers/Peri-Wound Care: Wound #1 Midline Sacrum: Skin Prep Primary Wound Dressing: Wound #1 Midline Sacrum: Aquacel Ag - or silver alginate Secondary Dressing: Wound #1 Midline Sacrum: Boardered Foam Dressing Dressing Change Frequency: Wound #1 Midline Sacrum: Change dressing every day. Follow-up Appointments: Wound #1 Midline Sacrum: Return Appointment in 1 week. Dorrene GermanHARDY, Jazell ([id number]) Off-Loading: Wound #1 Midline Sacrum: Turn and reposition every 2 hours Additional Orders / Instructions: Wound #1 Midline Sacrum: Increase protein intake. Medications-please add to medication list.: Wound #1 Midline Sacrum: Other: - Please add vitamin C and zinc and vitamin A to patient's medications daily Follow-Up Appointments: A follow-up appointment should be scheduled. A Patient Clinical Summary of Care was provided to FH 1. discussed clinical findings and implications with pt and family member. all questions were answered. Electronic Signature(s) Signed: 05/31/2016 4:13:02 PM By: Georges LynchSaunders, Sharon FNP Previous Signature: 05/08/2016 4:03:42 PM Version By: Georges LynchSaunders, Sharon FNP Entered By: Georges LynchSaunders, Sharon on 05/31/2016 16:04:42 Dorrene GermanHARDY, Amity ([id number]) -------------------------------------------------------------------------------- SuperBill Details Patient Name: Dorrene GermanHARDY, Aiesha Date of Service:  05/08/2016 Medical Record Number: [id number] Patient Account Number: [id number] Date of Birth/Sex: 1924/01/27 (82 y.o. Female) Treating RN: Curtis Sitesorthy, Joanna Primary Care Physician: SYSTEM, PCP Other Clinician: Referring Physician: Julieanne CottonEVESHWAR, SANJEEV Treating Physician/Extender: [name] GarnetSaunders, Sharon Weeks in Treatment: 5 Diagnosis Coding ICD-10 Codes Code Description L89.153 Pressure ulcer of sacral region, stage 3 Wedge compression fracture of first lumbar vertebra, subsequent encounter for fracture S32.010D with routine healing Facility Procedures CPT4 Code: [id number] Description: 11042 - DEB SUBQ TISSUE 20 SQ CM/< ICD-10 Description Diagnosis L89.153 Pressure ulcer of sacral region, stage 3 Modifier: Quantity: 1 Physician Procedures CPT4 Code: [id number] Description: 11042 - WC PHYS SUBQ TISS 20 SQ CM ICD-10 Description Diagnosis L89.153 Pressure ulcer of sacral region, stage 3 Modifier: Quantity: 1 Electronic Signature(s) Signed: 05/08/2016 4:03:42 PM By: Georges LynchSaunders, Sharon FNP Entered By: Georges LynchSaunders, Sharon on 05/08/2016 10:28:37

## 2016-05-11 ENCOUNTER — Telehealth (HOSPITAL_COMMUNITY): Payer: Self-pay

## 2016-05-11 NOTE — Telephone Encounter (Signed)
Called [facility] to inform them that Dr. Corliss Skainseveshwar does not want to go forward with the procedure until pt is treated for her infection. They are going to send this message to the Dr and get back with us. They are not sure if he is going to treat but will update as soon as they here back. Pt may be getting discharged soon. AW

## 2016-05-15 ENCOUNTER — Encounter: Payer: Medicare Other | Admitting: Nurse Practitioner

## 2016-05-15 DIAGNOSIS — L89153 Pressure ulcer of sacral region, stage 3: Secondary | ICD-10-CM | POA: Diagnosis not present

## 2016-05-15 NOTE — Progress Notes (Signed)
Tara Frederick, Tara Frederick ([id number]) Visit Report for 05/15/2016 Chief Complaint Document Details Patient Name: Tara Frederick, Tara Frederick 05/15/2016 10:00 Date of Service: AM Medical Record [id number] Number: Patient Account Number: [id number] Dec 11, 1923 (82 y.o. Treating RN: Tara Frederick Date of Birth/Sex: Female) Other Clinician: Primary Care Physician: Tara Frederick, PCP Treating Tara Frederick Referring Physician: Julieanne Frederick Physician/Extender: Tara Frederick in Treatment: 6 Information Obtained from: Patient Chief Complaint Patient is at the clinic for treatment of an open pressure ulcer to the sacral region for about 2 weeks Electronic Signature(s) Signed: 05/15/2016 4:05:49 PM By: Tara Lynch FNP Entered By: Tara Frederick on 05/15/2016 10:40:55 Tara Frederick ([id number]) -------------------------------------------------------------------------------- Debridement Details Patient Name: Tara Frederick, Tara Frederick 05/15/2016 10:00 Date of Service: AM Medical Record [id number] Number: Patient Account Number: [id number] [birth year] (82 y.o. Treating RN: Tara Frederick Date of Birth/Sex: Female) Other Clinician: Primary Care Physician: Tara Frederick, PCP Treating Tara Frederick Referring Physician: Julieanne Frederick Physician/Extender: Tara Frederick in Treatment: 6 Debridement Performed for Wound #1 Midline Sacrum Assessment: Performed By: Physician Tara Lynch, NP Debridement: Debridement Pre-procedure Yes - 10:15 Verification/Time Out Taken: Start Time: 10:15 Pain Control: Lidocaine 4% Topical Solution Level: Skin/Subcutaneous Tissue Total Area Debrided (L x 0.3 (cm) x 0.3 (cm) = 0.09 (cm) W): Tissue and other Viable, Non-Viable, Fibrin/Slough, Subcutaneous material debrided: Instrument: Curette Bleeding: Minimum Hemostasis Achieved: Pressure End Time: 10:17 Procedural Pain: 0 Post Procedural Pain: 0 Response to Treatment: Procedure was tolerated well Post Debridement Measurements of Total  Wound Length: (cm) 0.3 Stage: Category/Stage III Width: (cm) 0.3 Depth: (cm) 0.2 Volume: (cm) 0.014 Character of Wound/Ulcer Post Improved Debridement: Severity of Tissue Post Fat layer exposed Debridement: Post Procedure Diagnosis Same as Pre-procedure Electronic Signature(s) Signed: 05/15/2016 2:27:00 PM By: Tara Frederick, Tara Frederick ([id number]) Signed: 05/15/2016 4:05:49 PM By: Tara Lynch FNP Entered By: Tara Frederick on 05/15/2016 10:18:43 Tara Frederick, Tara Frederick ([id number]) -------------------------------------------------------------------------------- HPI Details Patient Name: Tara Frederick, Tara Frederick 05/15/2016 10:00 Date of Service: AM Medical Record [id number] Number: Patient Account Number: [id number] Jun 30, 1924 (82 y.o. Treating RN: Tara Frederick Date of Birth/Sex: Female) Other Clinician: Primary Care Physician: Tara Frederick, PCP Treating Tara Frederick Referring Physician: Julieanne Frederick Physician/Extender: Tara Frederick in Treatment: 6 History of Present Illness Location: sacral region Quality: Patient reports experiencing a sharp pain to affected area(s). Severity: Patient states wound are getting worse. Duration: Patient has had the wound for < 2 weeks prior to presenting for treatment Timing: Pain in wound is Intermittent (comes and goes Context: The wound would happen gradually Modifying Factors: Other treatment(s) tried include:since L1 compression fracturesurgery for a Associated Signs and Symptoms: Patient reports having difficulty standing for long periods. HPI Description: 82 year old patient who was recently admitted to [facility] on July 28 was discharged on August 10. She was treated for severe constipation, L1 compression fracture and is status post kyphoplasty on 8/8. Also treated for a UTI with Levaquin and continued on supportive care. Past medical history significant for hypertension, GERD, degenerative disc disease. Status post appendectomy,  hemorrhoid surgery and a total abdominal hysterectomy with bilateral salpingo- oophorectomy. Present she is in a rehabilitation facility and ambulating with a walker. Her protein intake is said to be good and she is on vitamin supplements. She has an appropriate bed surface. 05/08/16: pt returns today for ongoing eval and management of a sacrum PU. wound is improving. no reports of systemic s/s of infection. she is accompanied by a family member today. no new wounds or skin breakdown. 05/15/16: returns for f/u today. she is planning to return home this week after undergoing rehab at a SNF. denies  systemic s/s of infection. no new ulcers or skin breakdown. wound measures smaller today. Electronic Signature(s) Signed: 05/15/2016 4:05:49 PM By: Tara Lynch FNP Entered By: Tara Frederick on 05/15/2016 10:41:54 Tara Frederick, Tara Frederick ([id number]) -------------------------------------------------------------------------------- Physical Exam Details Patient Name: Tara Frederick 05/15/2016 10:00 Date of Service: AM Medical Record [id number] Number: Patient Account Number: [id number] 1923-09-17 (82 y.o. Treating RN: Tara Frederick Date of Birth/Sex: Female) Other Clinician: Primary Care Physician: Tara Frederick, PCP Treating Tara Frederick Referring Physician: Julieanne Frederick Physician/Extender: Tara Frederick in Treatment: 6 Constitutional thin. chronically ill appearing elderly female in NAD.. Ears, Nose, Mouth, and Throat Patient can hear normal speaking tones without difficulty.Marland Kitchen Respiratory Respiratory effort is easy and symmetric bilaterally. Rate is normal at rest and on room air.Marland Kitchen Psychiatric Judgement and insight intact.. Alert and oriented times 3.. Short and long term memory intact.. No evidence of depression, anxiety, or agitation. Calm, cooperative, and communicative. Appropriate interactions and affect.. Electronic Signature(s) Signed: 05/15/2016 4:05:49 PM By: Tara Lynch FNP Entered  By: Tara Frederick on 05/15/2016 10:42:26 Tara Frederick, Tara Frederick ([id number]) -------------------------------------------------------------------------------- Physician Orders Details Patient Name: Tara Frederick, Tara Frederick 05/15/2016 10:00 Date of Service: AM Medical Record [id number] Number: Patient Account Number: [id number] 09/28/1923 (82 y.o. Treating RN: Tara Frederick Date of Birth/Sex: Female) Other Clinician: Primary Care Physician: Tara Frederick, PCP Treating Tara Frederick Referring Physician: Julieanne Frederick Physician/Extender: Tara Frederick in Treatment: 6 Verbal / Phone Orders: Yes Clinician: Curtis Frederick Read Back and Verified: Yes Diagnosis Coding Wound Cleansing Wound #1 Midline Sacrum o Clean wound with Normal Saline. Anesthetic Wound #1 Midline Sacrum o Topical Lidocaine 4% cream applied to wound bed prior to debridement Skin Barriers/Peri-Wound Care Wound #1 Midline Sacrum o Skin Prep Primary Wound Dressing Wound #1 Midline Sacrum o Aquacel Ag - or silver alginate Secondary Dressing Wound #1 Midline Sacrum o Boardered Foam Dressing Dressing Change Frequency Wound #1 Midline Sacrum o Change dressing every day. Follow-up Appointments Wound #1 Midline Sacrum o Return Appointment in 1 week. Off-Loading Wound #1 Midline Sacrum o Turn and reposition every 2 hours Gravois, Jermya ([id number]) Additional Orders / Instructions Wound #1 Midline Sacrum o Increase protein intake. Medications-please add to medication list. Wound #1 Midline Sacrum o Other: - Please add vitamin C and zinc and vitamin A to patient's medications daily Electronic Signature(s) Signed: 05/15/2016 2:27:00 PM By: Tara Frederick Signed: 05/15/2016 4:05:49 PM By: Tara Lynch FNP Entered By: Tara Frederick on 05/15/2016 10:16:50 Tara Frederick, Tara Frederick ([id number]) -------------------------------------------------------------------------------- Problem List Details Patient Name: Tara Frederick, Tara Frederick  05/15/2016 10:00 Date of Service: AM Medical Record [id number] Number: Patient Account Number: [id number] [birth year] (82 y.o. Treating RN: Tara Frederick Date of Birth/Sex: Female) Other Clinician: Primary Care Physician: Tara Frederick, PCP Treating Tara Frederick Referring Physician: Julieanne Frederick Physician/Extender: Tara Frederick in Treatment: 6 Active Problems ICD-10 Encounter Code Description Active Date Diagnosis L89.153 Pressure ulcer of sacral region, stage 3 04/03/2016 Yes S32.010D Wedge compression fracture of first lumbar vertebra, 04/03/2016 Yes subsequent encounter for fracture with routine healing Inactive Problems Resolved Problems Electronic Signature(s) Signed: 05/15/2016 4:05:49 PM By: Tara Lynch FNP Entered By: Tara Frederick on 05/15/2016 10:40:48 Tara Frederick ([id number]) -------------------------------------------------------------------------------- Progress Note Details Patient Name: Tara Frederick, Tara Frederick 05/15/2016 10:00 Date of Service: AM Medical Record [id number] Number: Patient Account Number: [id number] 31-Dec-1923 (82 y.o. Treating RN: Tara Frederick Date of Birth/Sex: Female) Other Clinician: Primary Care Physician: Tara Frederick, PCP Treating Tara Frederick Referring Physician: Julieanne Frederick Physician/Extender: Tara Frederick in Treatment: 6 Subjective Chief Complaint Information obtained from Patient Patient is at the clinic for treatment of an open pressure ulcer to the sacral region for about  2 weeks History of Present Illness (HPI) The following HPI elements were documented for the patient's wound: Location: sacral region Quality: Patient reports experiencing a sharp pain to affected area(s). Severity: Patient states wound are getting worse. Duration: Patient has had the wound for < 2 weeks prior to presenting for treatment Timing: Pain in wound is Intermittent (comes and goes Context: The wound would happen gradually Modifying Factors: Other  treatment(s) tried include:since L1 compression fracturesurgery for a Associated Signs and Symptoms: Patient reports having difficulty standing for long periods. 82 year old patient who was recently admitted to [facility] on July 28 was discharged on August 10. She was treated for severe constipation, L1 compression fracture and is status post kyphoplasty on 8/8. Also treated for a UTI with Levaquin and continued on supportive care. Past medical history significant for hypertension, GERD, degenerative disc disease. Status post appendectomy, hemorrhoid surgery and a total abdominal hysterectomy with bilateral salpingo- oophorectomy. Present she is in a rehabilitation facility and ambulating with a walker. Her protein intake is said to be good and she is on vitamin supplements. She has an appropriate bed surface. 05/08/16: pt returns today for ongoing eval and management of a sacrum PU. wound is improving. no reports of systemic s/s of infection. she is accompanied by a family member today. no new wounds or skin breakdown. 05/15/16: returns for f/u today. she is planning to return home this week after undergoing rehab at a SNF. denies systemic s/s of infection. no new ulcers or skin breakdown. wound measures smaller today. Objective Tara Frederick, Tara Frederick ([id number]) Constitutional thin. chronically ill appearing elderly female in NAD.Marland Kitchen Vitals Time Taken: 10:07 AM, Height: 61 in, Weight: 113 lbs, BMI: 21.3, Temperature: 98.2 F, Pulse: 81 bpm, Respiratory Rate: 16 breaths/min, Blood Pressure: 131/58 mmHg. Ears, Nose, Mouth, and Throat Patient can hear normal speaking tones without difficulty.Marland Kitchen Respiratory Respiratory effort is easy and symmetric bilaterally. Rate is normal at rest and on room air.Marland Kitchen Psychiatric Judgement and insight intact.. Alert and oriented times 3.. Short and long term memory intact.. No evidence of depression, anxiety, or agitation. Calm, cooperative, and communicative.  Appropriate interactions and affect.. Integumentary (Hair, Skin) Wound #1 status is Open. Original cause of wound was Pressure Injury. The wound is located on the Midline Sacrum. The wound measures 0.3cm length x 0.3cm width x 0.2cm depth; 0.071cm^2 area and 0.014cm^3 volume. The wound is limited to skin breakdown. There is no tunneling or undermining noted. There is a large amount of serous drainage noted. The wound margin is flat and intact. There is large (67- 100%) pink granulation within the wound bed. There is a small (1-33%) amount of necrotic tissue within the wound bed including Adherent Slough. The periwound skin appearance exhibited: Moist, Erythema. The periwound skin appearance did not exhibit: Callus, Crepitus, Excoriation, Fluctuance, Friable, Induration, Localized Edema, Rash, Scarring, Dry/Scaly, Maceration, Atrophie Blanche, Cyanosis, Ecchymosis, Hemosiderin Staining, Mottled, Pallor, Rubor. The surrounding wound skin color is noted with erythema which is circumferential. Periwound temperature was noted as No Abnormality. The periwound has tenderness on palpation. Assessment Active Problems ICD-10 L89.153 - Pressure ulcer of sacral region, stage 3 S32.010D - Wedge compression fracture of first lumbar vertebra, subsequent encounter for fracture with routine healing Tara Frederick, Tara Frederick ([id number]) Procedures Wound #1 Wound #1 is a Pressure Ulcer located on the Midline Sacrum . There was a Skin/Subcutaneous Tissue Debridement ([id number]) debridement with total area of 0.09 sq cm performed by Tara Lynch, NP. with the following instrument(s): Curette to remove Viable and Non-Viable tissue/material  including Fibrin/Slough and Subcutaneous after achieving pain control using Lidocaine 4% Topical Solution. A time out was conducted at 10:15, prior to the start of the procedure. A Minimum amount of bleeding was controlled with Pressure. The procedure was tolerated well with a  pain level of 0 throughout and a pain level of 0 following the procedure. Post Debridement Measurements: 0.3cm length x 0.3cm width x 0.2cm depth; 0.014cm^3 volume. Post debridement Stage noted as Category/Stage III. Character of Wound/Ulcer Post Debridement is improved. Severity of Tissue Post Debridement is: Fat layer exposed. Post procedure Diagnosis Wound #1: Same as Pre-Procedure Plan Wound Cleansing: Wound #1 Midline Sacrum: Clean wound with Normal Saline. Anesthetic: Wound #1 Midline Sacrum: Topical Lidocaine 4% cream applied to wound bed prior to debridement Skin Barriers/Peri-Wound Care: Wound #1 Midline Sacrum: Skin Prep Primary Wound Dressing: Wound #1 Midline Sacrum: Aquacel Ag - or silver alginate Secondary Dressing: Wound #1 Midline Sacrum: Boardered Foam Dressing Dressing Change Frequency: Wound #1 Midline Sacrum: Change dressing every day. Follow-up Appointments: Wound #1 Midline Sacrum: Return Appointment in 1 week. Off-Loading: Wound #1 Midline Sacrum: Turn and reposition every 2 hours Additional Orders / Instructions: Wound #1 Midline Sacrum: Increase protein intake. Tara GermanHARDY, Deidre ([id number]) Medications-please add to medication list.: Wound #1 Midline Sacrum: Other: - Please add vitamin C and zinc and vitamin A to patient's medications daily Follow-Up Appointments: A follow-up appointment should be scheduled. A Patient Clinical Summary of Care was provided to [facility] 1. discussed clinical findings and implications with pt and daughter. all questions were answered. Electronic Signature(s) Signed: 05/15/2016 4:05:49 PM By: Tara LynchSaunders, Mailen Newborn FNP Entered By: Tara LynchSaunders, Jae Bruck on 05/15/2016 10:42:58 Tara GermanHARDY, Romona ([id number]) -------------------------------------------------------------------------------- SuperBill Details Patient Name: Tara GermanHARDY, Alexzandria Date of Service: 05/15/2016 Medical Record Number: [id number] Patient Account Number: [id number] Date of  Birth/Sex: 1924/08/07 (82 y.o. Female) Treating RN: Tara Sitesorthy, Joanna Primary Care Physician: Tara Frederick, PCP Other Clinician: Referring Physician: Julieanne CottonEVESHWAR, SANJEEV Treating Physician/Extender: [name] GarnetSaunders, Adaya Garmany Weeks in Treatment: 6 Diagnosis Coding ICD-10 Codes Code Description L89.153 Pressure ulcer of sacral region, stage 3 Wedge compression fracture of first lumbar vertebra, subsequent encounter for fracture S32.010D with routine healing Facility Procedures CPT4 Code: [id number] Description: 11042 - DEB SUBQ TISSUE 20 SQ CM/< ICD-10 Description Diagnosis L89.153 Pressure ulcer of sacral region, stage 3 Modifier: Quantity: 1 Physician Procedures CPT4 Code: [id number] Description: 11042 - WC PHYS SUBQ TISS 20 SQ CM ICD-10 Description Diagnosis L89.153 Pressure ulcer of sacral region, stage 3 Modifier: Quantity: 1 Electronic Signature(s) Signed: 05/15/2016 4:05:49 PM By: Tara LynchSaunders, Samiah Ricklefs FNP Entered By: Tara LynchSaunders, Espn Zeman on 05/15/2016 10:43:10

## 2016-05-15 NOTE — Progress Notes (Signed)
Tara Frederick ([id number]) Visit Report for 05/15/2016 Arrival Information Details Patient Name: Tara Frederick, Tara Frederick Date of Service: 05/15/2016 10:00 AM Medical Record Number: [id number] Patient Account Number: [id number] Date of Birth/Sex: 1924/01/19 (82 y.o. Female) Treating RN: Curtis Sites Primary Care Physician: SYSTEM, PCP Other Clinician: Referring Physician: Julieanne Cotton Treating Physician/Extender: [name] Garnet in Treatment: 6 Visit Information History Since Last Visit Added or deleted any medications: No Patient Arrived: Walker Any new allergies or adverse reactions: No Arrival Time: 10:07 Had a fall or experienced change in No Accompanied By: dtr activities of daily living that may affect Transfer Assistance: None risk of falls: Patient Identification Verified: Yes Signs or symptoms of abuse/neglect since last No Secondary Verification Process Completed: Yes visito Hospitalized since last visit: No Pain Present Now: No Electronic Signature(s) Signed: 05/15/2016 2:27:00 PM By: Curtis Sites Entered By: Curtis Sites on 05/15/2016 10:07:38 Tara Frederick ([id number]) -------------------------------------------------------------------------------- Encounter Discharge Information Details Patient Name: Tara Frederick Date of Service: 05/15/2016 10:00 AM Medical Record Number: [id number] Patient Account Number: [id number] Date of Birth/Sex: 01-Jun-1924 (82 y.o. Female) Treating RN: Curtis Sites Primary Care Physician: SYSTEM, PCP Other Clinician: Referring Physician: Julieanne Cotton Treating Physician/Extender: [name] Garnet in Treatment: 6 Encounter Discharge Information Items Discharge Pain Level: 0 Discharge Condition: Stable Ambulatory Status: Walker Discharge Destination: Nursing Home Transportation: Private Auto Accompanied By: dtr Schedule Follow-up Appointment: Yes Medication Reconciliation completed No and provided to  Patient/Care Demonta Wombles: Provided on Clinical Summary of Care: 05/15/2016 Form Type Recipient Paper Patient FH Electronic Signature(s) Signed: 05/15/2016 10:32:44 AM By: Curtis Sites Previous Signature: 05/15/2016 10:28:28 AM Version By: [name] Entered By: Curtis Sites on 05/15/2016 10:32:44 Tara Frederick ([id number]) -------------------------------------------------------------------------------- Multi Wound Chart Details Patient Name: Tara Frederick Date of Service: 05/15/2016 10:00 AM Medical Record Number: [id number] Patient Account Number: [id number] Date of Birth/Sex: Sep 04, 1923 (82 y.o. Female) Treating RN: Curtis Sites Primary Care Physician: SYSTEM, PCP Other Clinician: Referring Physician: Julieanne Cotton Treating Physician/Extender: [name] Garnet in Treatment: 6 Vital Signs Height(in): 61 Pulse(bpm): 81 Weight(lbs): 113 Blood Pressure 131/58 (mmHg): Body Mass Index(BMI): 21 Temperature(F): 98.2 Respiratory Rate 16 (breaths/min): Photos: [1:No Photos] [N/A:N/A] Wound Location: [1:Sacrum - Midline] [N/A:N/A] Wounding Event: [1:Pressure Injury] [N/A:N/A] Primary Etiology: [1:Pressure Ulcer] [N/A:N/A] Comorbid History: [1:Cataracts, Glaucoma, Asthma, Hypertension, Osteoarthritis] [N/A:N/A] Date Acquired: [1:03/20/2016] [N/A:N/A] Weeks of Treatment: [1:6] [N/A:N/A] Wound Status: [1:Open] [N/A:N/A] Measurements L x W x D 0.3x0.3x0.2 [N/A:N/A] (cm) Area (cm) : [1:0.071] [N/A:N/A] Volume (cm) : [1:0.014] [N/A:N/A] % Reduction in Area: [1:88.70%] [N/A:N/A] % Reduction in Volume: 88.90% [N/A:N/A] Classification: [1:Category/Stage III] [N/A:N/A] Exudate Amount: [1:Large] [N/A:N/A] Exudate Type: [1:Serous] [N/A:N/A] Exudate Color: [1:amber] [N/A:N/A] Wound Margin: [1:Flat and Intact] [N/A:N/A] Granulation Amount: [1:Large (67-100%)] [N/A:N/A] Granulation Quality: [1:Pink] [N/A:N/A] Necrotic Amount: [1:Small (1-33%)] [N/A:N/A] Exposed  Structures: [1:Fascia: No Fat: No Tendon: No Muscle: No Joint: No Bone: No] [N/A:N/A] Limited to Skin Breakdown Epithelialization: Small (1-33%) N/A N/A Periwound Skin Texture: Edema: No N/A N/A Excoriation: No Induration: No Callus: No Crepitus: No Fluctuance: No Friable: No Rash: No Scarring: No Periwound Skin Moist: Yes N/A N/A Moisture: Maceration: No Dry/Scaly: No Periwound Skin Color: Erythema: Yes N/A N/A Atrophie Blanche: No Cyanosis: No Ecchymosis: No Hemosiderin Staining: No Mottled: No Pallor: No Rubor: No Erythema Location: Circumferential N/A N/A Temperature: No Abnormality N/A N/A Tenderness on Yes N/A N/A Palpation: Wound Preparation: Ulcer Cleansing: N/A N/A Rinsed/Irrigated with Saline Topical Anesthetic Applied: Other: lidocaine 4% Treatment Notes Electronic Signature(s) Signed: 05/15/2016 10:14:44 AM By: Curtis Sites Entered By: Curtis Sites on 05/15/2016 10:14:44 [name],  Frederick ([id number]) -------------------------------------------------------------------------------- Multi-Disciplinary Care Plan Details Patient Name: Tara Frederick Date of Service: 05/15/2016 10:00 AM Medical Record Number: [id number] Patient Account Number: [id number] Date of Birth/Sex: 08-31-1923 (82 y.o. Female) Treating RN: Curtis Sitesorthy, Joanna Primary Care Physician: SYSTEM, PCP Other Clinician: Referring Physician: Julieanne CottonEVESHWAR, SANJEEV Treating Physician/Extender: [name] GarnetSaunders, Sharon Weeks in Treatment: 6 Active Inactive Abuse / Safety / Falls / Self Care Management Nursing Diagnoses: Impaired physical mobility Potential for falls Goals: Patient will remain injury free Date Initiated: 04/03/2016 Goal Status: Active Interventions: Assess fall risk on admission and as needed Notes: Orientation to the Wound Care Program Nursing Diagnoses: Knowledge deficit related to the wound healing center program Goals: Patient/caregiver will verbalize understanding of the Wound  Healing Center Program Date Initiated: 04/03/2016 Goal Status: Active Interventions: Provide education on orientation to the wound center Notes: Pressure Nursing Diagnoses: Knowledge deficit related to management of pressures ulcers Goals: Patient will remain free from development of additional pressure ulcers Tara GermanHARDY, Gladyse ([id number]) Date Initiated: 04/03/2016 Goal Status: Active Interventions: Assess: immobility, friction, shearing, incontinence upon admission and as needed Provide education on pressure ulcers Notes: Wound/Skin Impairment Nursing Diagnoses: Impaired tissue integrity Goals: Patient/caregiver will verbalize understanding of skin care regimen Date Initiated: 04/03/2016 Goal Status: Active Ulcer/skin breakdown will have a volume reduction of 30% by week 4 Date Initiated: 04/03/2016 Goal Status: Active Ulcer/skin breakdown will have a volume reduction of 50% by week 8 Date Initiated: 04/03/2016 Goal Status: Active Ulcer/skin breakdown will have a volume reduction of 80% by week 12 Date Initiated: 04/03/2016 Goal Status: Active Ulcer/skin breakdown will heal within 14 weeks Date Initiated: 04/03/2016 Goal Status: Active Interventions: Assess patient/caregiver ability to obtain necessary supplies Assess patient/caregiver ability to perform ulcer/skin care regimen upon admission and as needed Assess ulceration(s) every visit Notes: Electronic Signature(s) Signed: 05/15/2016 10:14:38 AM By: Curtis Sitesorthy, Joanna Entered By: Curtis Sitesorthy, Joanna on 05/15/2016 10:14:38 Tara GermanHARDY, Amesha ([id number]) -------------------------------------------------------------------------------- Pain Assessment Details Patient Name: Tara GermanHARDY, Kelicia Date of Service: 05/15/2016 10:00 AM Medical Record Number: [id number] Patient Account Number: [id number] Date of Birth/Sex: 08-31-1923 (82 y.o. Female) Treating RN: Curtis Sitesorthy, Joanna Primary Care Physician: SYSTEM, PCP Other Clinician: Referring  Physician: Julieanne CottonEVESHWAR, SANJEEV Treating Physician/Extender: [name] GarnetSaunders, Sharon Weeks in Treatment: 6 Active Problems Location of Pain Severity and Description of Pain Patient Has Paino No Site Locations Pain Management and Medication Current Pain Management: Electronic Signature(s) Signed: 05/15/2016 2:27:00 PM By: Curtis Sitesorthy, Joanna Entered By: Curtis Sitesorthy, Joanna on 05/15/2016 10:07:48 Tara GermanHARDY, Buelah ([id number]) -------------------------------------------------------------------------------- Patient/Caregiver Education Details Patient Name: Tara GermanHARDY, Matilda Date of Service: 05/15/2016 10:00 AM Medical Record Number: [id number] Patient Account Number: [id number] Date of Birth/Gender: 08-31-1923 (82 y.o. Female) Treating RN: Curtis Sitesorthy, Joanna Primary Care Physician: SYSTEM, PCP Other Clinician: Referring Physician: Julieanne CottonEVESHWAR, SANJEEV Treating Physician/Extender: [name] GarnetSaunders, Sharon Weeks in Treatment: 6 Education Assessment Education Provided To: Patient Education Topics Provided Nutrition: Handouts: Nutrition Methods: Explain/Verbal Responses: State content correctly Electronic Signature(s) Signed: 05/15/2016 2:27:00 PM By: Curtis Sitesorthy, Joanna Entered By: Curtis Sitesorthy, Joanna on 05/15/2016 10:32:57 Tara GermanHARDY, Cassanda ([id number]) -------------------------------------------------------------------------------- Wound Assessment Details Patient Name: Tara GermanHARDY, Dreyah Date of Service: 05/15/2016 10:00 AM Medical Record Number: [id number] Patient Account Number: [id number] Date of Birth/Sex: 08-31-1923 (82 y.o. Female) Treating RN: Curtis Sitesorthy, Joanna Primary Care Physician: SYSTEM, PCP Other Clinician: Referring Physician: Julieanne CottonEVESHWAR, SANJEEV Treating Physician/Extender: [name] GarnetSaunders, Sharon Weeks in Treatment: 6 Wound Status Wound Number: 1 Primary Pressure Ulcer Etiology: Wound Location: Sacrum - Midline Wound Open Wounding Event: Pressure Injury Status: Date Acquired: 03/20/2016 Comorbid Cataracts, Glaucoma,  Asthma, Weeks Of Treatment: 6 History: Hypertension, Osteoarthritis Clustered Wound: No Photos Wound  Measurements Length: (cm) 0.3 Width: (cm) 0.3 Depth: (cm) 0.2 Area: (cm) 0.071 Volume: (cm) 0.014 % Reduction in Area: 88.7% % Reduction in Volume: 88.9% Epithelialization: Small (1-33%) Tunneling: No Undermining: No Wound Description Classification: Category/Stage III Wound Margin: Flat and Intact Exudate Amount: Large Exudate Type: Serous Exudate Color: amber Foul Odor After Cleansing: No Wound Bed Granulation Amount: Large (67-100%) Exposed Structure Granulation Quality: Pink Fascia Exposed: No Necrotic Amount: Small (1-33%) Fat Layer Exposed: No Necrotic Quality: Adherent Slough Tendon Exposed: No Muscle Exposed: No Wieman, Camani ([id number]) Joint Exposed: No Bone Exposed: No Limited to Skin Breakdown Periwound Skin Texture Texture Color No Abnormalities Noted: No No Abnormalities Noted: No Callus: No Atrophie Blanche: No Crepitus: No Cyanosis: No Excoriation: No Ecchymosis: No Fluctuance: No Erythema: Yes Friable: No Erythema Location: Circumferential Induration: No Hemosiderin Staining: No Localized Edema: No Mottled: No Rash: No Pallor: No Scarring: No Rubor: No Moisture Temperature / Pain No Abnormalities Noted: No Temperature: No Abnormality Dry / Scaly: No Tenderness on Palpation: Yes Maceration: No Moist: Yes Wound Preparation Ulcer Cleansing: Rinsed/Irrigated with Saline Topical Anesthetic Applied: Other: lidocaine 4%, Treatment Notes Wound #1 (Midline Sacrum) 1. Cleansed with: Clean wound with Normal Saline 2. Anesthetic Topical Lidocaine 4% cream to wound bed prior to debridement 4. Dressing Applied: Aquacel Ag 5. Secondary Dressing Applied Bordered Foam Dressing Electronic Signature(s) Signed: 05/15/2016 2:27:00 PM By: Curtis Sites Entered By: Curtis Sites on 05/15/2016 11:05:08 ESTY, AHUJA  ([id number]) -------------------------------------------------------------------------------- Vitals Details Patient Name: Tara Frederick Date of Service: 05/15/2016 10:00 AM Medical Record Number: [id number] Patient Account Number: [id number] Date of Birth/Sex: Apr 11, 1924 (82 y.o. Female) Treating RN: Curtis Sites Primary Care Physician: SYSTEM, PCP Other Clinician: Referring Physician: Julieanne Cotton Treating Physician/Extender: [name] Garnet in Treatment: 6 Vital Signs Time Taken: 10:07 Temperature (F): 98.2 Height (in): 61 Pulse (bpm): 81 Weight (lbs): 113 Respiratory Rate (breaths/min): 16 Body Mass Index (BMI): 21.3 Blood Pressure (mmHg): 131/58 Reference Range: 80 - 120 mg / dl Electronic Signature(s) Signed: 05/15/2016 2:27:00 PM By: Curtis Sites Entered By: Curtis Sites on 05/15/2016 10:08:19

## 2016-05-22 ENCOUNTER — Encounter (HOSPITAL_BASED_OUTPATIENT_CLINIC_OR_DEPARTMENT_OTHER): Payer: Medicare Other | Admitting: General Surgery

## 2016-05-22 DIAGNOSIS — L89153 Pressure ulcer of sacral region, stage 3: Secondary | ICD-10-CM

## 2016-05-22 NOTE — Progress Notes (Signed)
JURNIE, GARRITANO ([id number]) Visit Report for 05/22/2016 Arrival Information Details Patient Name: DOCIE, ABRAMOVICH Date of Service: 05/22/2016 10:45 AM Medical Record Number: [id number] Patient Account Number: [id number] Date of Birth/Sex: 1924/04/07 (82 y.o. Female) Treating RN: Curtis Sites Primary Care Physician: SYSTEM, PCP Other Clinician: Referring Physician: Julieanne Cotton Treating Physician/Extender: Elayne Snare in Treatment: 7 Visit Information History Since Last Visit Added or deleted any medications: No Patient Arrived: Walker Any new allergies or adverse reactions: No Arrival Time: 11:01 Had a fall or experienced change in No Accompanied By: dtr activities of daily living that may affect Transfer Assistance: None risk of falls: Patient Identification Verified: Yes Signs or symptoms of abuse/neglect since last No Secondary Verification Process Completed: Yes visito Hospitalized since last visit: No Pain Present Now: No Electronic Signature(s) Signed: 05/22/2016 2:10:49 PM By: Curtis Sites Entered By: Curtis Sites on 05/22/2016 11:05:33 [name] ([id number]) -------------------------------------------------------------------------------- Clinic Level of Care Assessment Details Patient Name: [name] Date of Service: 05/22/2016 10:45 AM Medical Record Number: [id number] Patient Account Number: [id number] Date of Birth/Sex: [birth year] (82 y.o. Female) Treating RN: Curtis Sites Primary Care Physician: SYSTEM, PCP Other Clinician: Referring Physician: Julieanne Cotton Treating Physician/Extender: Elayne Snare in Treatment: 7 Clinic Level of Care Assessment Items TOOL 4 Quantity Score []  - Use when only an EandM is performed on FOLLOW-UP visit 0 ASSESSMENTS - Nursing Assessment / Reassessment X - Reassessment of Co-morbidities (includes updates in patient status) 1 10 X - Reassessment of Adherence to Treatment Plan 1  5 ASSESSMENTS - Wound and Skin Assessment / Reassessment X - Simple Wound Assessment / Reassessment - one wound 1 5 []  - Complex Wound Assessment / Reassessment - multiple wounds 0 []  - Dermatologic / Skin Assessment (not related to wound area) 0 ASSESSMENTS - Focused Assessment []  - Circumferential Edema Measurements - multi extremities 0 []  - Nutritional Assessment / Counseling / Intervention 0 []  - Lower Extremity Assessment (monofilament, tuning fork, pulses) 0 []  - Peripheral Arterial Disease Assessment (using hand held doppler) 0 ASSESSMENTS - Ostomy and/or Continence Assessment and Care []  - Incontinence Assessment and Management 0 []  - Ostomy Care Assessment and Management (repouching, etc.) 0 PROCESS - Coordination of Care X - Simple Patient / Family Education for ongoing care 1 15 []  - Complex (extensive) Patient / Family Education for ongoing care 0 []  - Staff obtains Chiropractor, Records, Test Results / Process Orders 0 []  - Staff telephones HHA, Nursing Homes / Clarify orders / etc 0 []  - Routine Transfer to another Facility (non-emergent condition) 0 Bolda, Jayline ([id number]) []  - Routine Hospital Admission (non-emergent condition) 0 []  - New Admissions / Manufacturing engineer / Ordering NPWT, Apligraf, etc. 0 []  - Emergency Hospital Admission (emergent condition) 0 X - Simple Discharge Coordination 1 10 []  - Complex (extensive) Discharge Coordination 0 PROCESS - Special Needs []  - Pediatric / Minor Patient Management 0 []  - Isolation Patient Management 0 []  - Hearing / Language / Visual special needs 0 []  - Assessment of Community assistance (transportation, D/C planning, etc.) 0 []  - Additional assistance / Altered mentation 0 X - Support Surface(s) Assessment (bed, cushion, seat, etc.) 1 15 INTERVENTIONS - Wound Cleansing / Measurement X - Simple Wound Cleansing - one wound 1 5 []  - Complex Wound Cleansing - multiple wounds 0 X - Wound Imaging (photographs - any  number of wounds) 1 5 []  - Wound Tracing (instead of photographs) 0 X - Simple Wound Measurement - one wound 1 5 []  - Complex Wound Measurement -  multiple wounds 0 INTERVENTIONS - Wound Dressings X - Small Wound Dressing one or multiple wounds 1 10 []  - Medium Wound Dressing one or multiple wounds 0 []  - Large Wound Dressing one or multiple wounds 0 []  - Application of Medications - topical 0 []  - Application of Medications - injection 0 INTERVENTIONS - Miscellaneous []  - External ear exam 0 Cabeza, Ekaterini ([id number]) []  - Specimen Collection (cultures, biopsies, blood, body fluids, etc.) 0 []  - Specimen(s) / Culture(s) sent or taken to Lab for analysis 0 []  - Patient Transfer (multiple staff / Michiel Sites Lift / Similar devices) 0 []  - Simple Staple / Suture removal (25 or less) 0 []  - Complex Staple / Suture removal (26 or more) 0 []  - Hypo / Hyperglycemic Management (close monitor of Blood Glucose) 0 []  - Ankle / Brachial Index (ABI) - do not check if billed separately 0 X - Vital Signs 1 5 Has the patient been seen at the hospital within the last three years: Yes Total Score: 90 Level Of Care: New/Established - Level 3 Electronic Signature(s) Signed: 05/22/2016 2:10:49 PM By: Curtis Sites Entered By: Curtis Sites on 05/22/2016 11:19:03 [name] ([id number]) -------------------------------------------------------------------------------- Encounter Discharge Information Details Patient Name: [name] Date of Service: 05/22/2016 10:45 AM Medical Record Number: [id number] Patient Account Number: [id number] Date of Birth/Sex: [birth year] (82 y.o. Female) Treating RN: Curtis Sites Primary Care Physician: SYSTEM, PCP Other Clinician: Referring Physician: Julieanne Cotton Treating Physician/Extender: Elayne Snare in Treatment: 7 Encounter Discharge Information Items Discharge Pain Level: 0 Discharge Condition: Stable Ambulatory Status: Walker Discharge  Destination: Home Transportation: Private Auto Accompanied By: dtr Schedule Follow-up Appointment: Yes Medication Reconciliation completed and provided to Patient/Care No Lonell Stamos: Provided on Clinical Summary of Care: 05/22/2016 Form Type Recipient Paper Patient FH Electronic Signature(s) Signed: 05/22/2016 11:26:56 AM By: [name] Previous Signature: 05/22/2016 11:25:50 AM Version By: Ardath Sax MD Entered By: [name] on 05/22/2016 11:26:56 JALINA, BLOWERS ([id number]) -------------------------------------------------------------------------------- Multi Wound Chart Details Patient Name: [name] Date of Service: 05/22/2016 10:45 AM Medical Record Number: [id number] Patient Account Number: [id number] Date of Birth/Sex: April 25, 1924 (82 y.o. Female) Treating RN: Curtis Sites Primary Care Physician: SYSTEM, PCP Other Clinician: Referring Physician: Julieanne Cotton Treating Physician/Extender: Elayne Snare in Treatment: 7 Vital Signs Height(in): 61 Pulse(bpm): 86 Weight(lbs): 113 Blood Pressure 114/82 (mmHg): Body Mass Index(BMI): 21 Temperature(F): 98.0 Respiratory Rate 16 (breaths/min): Photos: [1:No Photos] [N/A:N/A] Wound Location: [1:Sacrum - Midline] [N/A:N/A] Wounding Event: [1:Pressure Injury] [N/A:N/A] Primary Etiology: [1:Pressure Ulcer] [N/A:N/A] Comorbid History: [1:Cataracts, Glaucoma, Asthma, Hypertension, Osteoarthritis] [N/A:N/A] Date Acquired: [1:03/20/2016] [N/A:N/A] Weeks of Treatment: [1:7] [N/A:N/A] Wound Status: [1:Open] [N/A:N/A] Measurements L x W x D 0.3x0.3x0.2 [N/A:N/A] (cm) Area (cm) : [1:0.071] [N/A:N/A] Volume (cm) : [1:0.014] [N/A:N/A] % Reduction in Area: [1:88.70%] [N/A:N/A] % Reduction in Volume: 88.90% [N/A:N/A] Classification: [1:Category/Stage III] [N/A:N/A] Exudate Amount: [1:Large] [N/A:N/A] Exudate Type: [1:Serous] [N/A:N/A] Exudate Color: [1:amber] [N/A:N/A] Wound Margin: [1:Flat and  Intact] [N/A:N/A] Granulation Amount: [1:Large (67-100%)] [N/A:N/A] Granulation Quality: [1:Pink] [N/A:N/A] Necrotic Amount: [1:Small (1-33%)] [N/A:N/A] Exposed Structures: [1:Fascia: No Fat: No Tendon: No Muscle: No Joint: No Bone: No] [N/A:N/A] Limited to Skin Breakdown Epithelialization: Small (1-33%) N/A N/A Periwound Skin Texture: Edema: No N/A N/A Excoriation: No Induration: No Callus: No Crepitus: No Fluctuance: No Friable: No Rash: No Scarring: No Periwound Skin Moist: Yes N/A N/A Moisture: Maceration: No Dry/Scaly: No Periwound Skin Color: Erythema: Yes N/A N/A Atrophie Blanche: No Cyanosis: No Ecchymosis: No Hemosiderin Staining: No Mottled: No Pallor: No Rubor: No Erythema Location:  Circumferential N/A N/A Temperature: No Abnormality N/A N/A Tenderness on Yes N/A N/A Palpation: Wound Preparation: Ulcer Cleansing: N/A N/A Rinsed/Irrigated with Saline Topical Anesthetic Applied: Other: lidocaine 4% Treatment Notes Electronic Signature(s) Signed: 05/22/2016 2:10:49 PM By: Curtis Sitesorthy, Joanna Entered By: Curtis Sitesorthy, Joanna on 05/22/2016 11:16:00 Dorrene GermanHARDY, Edie ([id number]) -------------------------------------------------------------------------------- Multi-Disciplinary Care Plan Details Patient Name: Dorrene GermanHARDY, Nikkol Date of Service: 05/22/2016 10:45 AM Medical Record Number: [id number] Patient Account Number: [id number] Date of Birth/Sex: July 06, 1924 (82 y.o. Female) Treating RN: Curtis Sitesorthy, Joanna Primary Care Physician: SYSTEM, PCP Other Clinician: Referring Physician: Julieanne CottonEVESHWAR, SANJEEV Treating Physician/Extender: Elayne SnarePARKER, PETER Weeks in Treatment: 7 Active Inactive Abuse / Safety / Falls / Self Care Management Nursing Diagnoses: Impaired physical mobility Potential for falls Goals: Patient will remain injury free Date Initiated: 04/03/2016 Goal Status: Active Interventions: Assess fall risk on admission and as needed Notes: Orientation to the Wound Care  Program Nursing Diagnoses: Knowledge deficit related to the wound healing center program Goals: Patient/caregiver will verbalize understanding of the Wound Healing Center Program Date Initiated: 04/03/2016 Goal Status: Active Interventions: Provide education on orientation to the wound center Notes: Pressure Nursing Diagnoses: Knowledge deficit related to management of pressures ulcers Goals: Patient will remain free from development of additional pressure ulcers Dorrene GermanHARDY, Denna ([id number]) Date Initiated: 04/03/2016 Goal Status: Active Interventions: Assess: immobility, friction, shearing, incontinence upon admission and as needed Provide education on pressure ulcers Notes: Wound/Skin Impairment Nursing Diagnoses: Impaired tissue integrity Goals: Patient/caregiver will verbalize understanding of skin care regimen Date Initiated: 04/03/2016 Goal Status: Active Ulcer/skin breakdown will have a volume reduction of 30% by week 4 Date Initiated: 04/03/2016 Goal Status: Active Ulcer/skin breakdown will have a volume reduction of 50% by week 8 Date Initiated: 04/03/2016 Goal Status: Active Ulcer/skin breakdown will have a volume reduction of 80% by week 12 Date Initiated: 04/03/2016 Goal Status: Active Ulcer/skin breakdown will heal within 14 weeks Date Initiated: 04/03/2016 Goal Status: Active Interventions: Assess patient/caregiver ability to obtain necessary supplies Assess patient/caregiver ability to perform ulcer/skin care regimen upon admission and as needed Assess ulceration(s) every visit Notes: Electronic Signature(s) Signed: 05/22/2016 2:10:49 PM By: Curtis Sitesorthy, Joanna Entered By: Curtis Sitesorthy, Joanna on 05/22/2016 11:15:51 Dorrene GermanHARDY, Ece ([id number]) -------------------------------------------------------------------------------- Pain Assessment Details Patient Name: Dorrene GermanHARDY, Angeline Date of Service: 05/22/2016 10:45 AM Medical Record Number: [id number] Patient Account Number:  [id number] Date of Birth/Sex: July 06, 1924 (82 y.o. Female) Treating RN: Curtis Sitesorthy, Joanna Primary Care Physician: SYSTEM, PCP Other Clinician: Referring Physician: Julieanne CottonEVESHWAR, SANJEEV Treating Physician/Extender: Elayne SnarePARKER, PETER Weeks in Treatment: 7 Active Problems Location of Pain Severity and Description of Pain Patient Has Paino No Site Locations Pain Management and Medication Current Pain Management: Notes Topical or injectable lidocaine is offered to patient for acute pain when surgical debridement is performed. If needed, Patient is instructed to use over the counter pain medication for the following 24-48 hours after debridement. Wound care MDs do not prescribed pain medications. Patient has chronic pain or uncontrolled pain. Patient has been instructed to make an appointment with their Primary Care Physician for pain management. Electronic Signature(s) Signed: 05/22/2016 2:10:49 PM By: Curtis Sitesorthy, Joanna Entered By: Curtis Sitesorthy, Joanna on 05/22/2016 11:05:44 Dorrene GermanHARDY, Marijane ([id number]) -------------------------------------------------------------------------------- Patient/Caregiver Education Details Patient Name: Dorrene GermanHARDY, Delmar Date of Service: 05/22/2016 10:45 AM Medical Record Number: [id number] Patient Account Number: [id number] Date of Birth/Gender: July 06, 1924 (82 y.o. Female) Treating RN: Curtis Sitesorthy, Joanna Primary Care Physician: SYSTEM, PCP Other Clinician: Referring Physician: Julieanne CottonEVESHWAR, SANJEEV Treating Physician/Extender: Elayne SnarePARKER, PETER Weeks in Treatment: 7 Education Assessment Education Provided To: Patient and Caregiver Education Topics Provided Pressure: Handouts: Other: continue  pressure reief Methods: Explain/Verbal Responses: State content correctly Electronic Signature(s) Signed: 05/22/2016 3:40:17 PM By: Ardath Sax MD Entered By: Ardath Sax on 05/22/2016 11:25:59 MATAYA, KILDUFF  ([id number]) -------------------------------------------------------------------------------- Wound Assessment Details Patient Name: [name] Date of Service: 05/22/2016 10:45 AM Medical Record Number: [id number] Patient Account Number: [id number] Date of Birth/Sex: 06/25/1924 (82 y.o. Female) Treating RN: Curtis Sites Primary Care Physician: SYSTEM, PCP Other Clinician: Referring Physician: Julieanne Cotton Treating Physician/Extender: Elayne Snare in Treatment: 7 Wound Status Wound Number: 1 Primary Pressure Ulcer Etiology: Wound Location: Sacrum - Midline Wound Open Wounding Event: Pressure Injury Status: Date Acquired: 03/20/2016 Comorbid Cataracts, Glaucoma, Asthma, Weeks Of Treatment: 7 History: Hypertension, Osteoarthritis Clustered Wound: No Wound Measurements Length: (cm) 0.3 Width: (cm) 0.3 Depth: (cm) 0.2 Area: (cm) 0.071 Volume: (cm) 0.014 % Reduction in Area: 88.7% % Reduction in Volume: 88.9% Epithelialization: Small (1-33%) Tunneling: No Undermining: No Wound Description Classification: Category/Stage III Wound Margin: Flat and Intact Exudate Amount: Large Exudate Type: Serous Exudate Color: amber Foul Odor After Cleansing: No Wound Bed Granulation Amount: Large (67-100%) Exposed Structure Granulation Quality: Pink Fascia Exposed: No Necrotic Amount: Small (1-33%) Fat Layer Exposed: No Necrotic Quality: Adherent Slough Tendon Exposed: No Muscle Exposed: No Joint Exposed: No Bone Exposed: No Limited to Skin Breakdown Periwound Skin Texture Texture Color No Abnormalities Noted: No No Abnormalities Noted: No Callus: No Atrophie Blanche: No Crepitus: No Cyanosis: No Excoriation: No Ecchymosis: No Fluctuance: No Erythema: Yes ALLISSA, ALBRIGHT ([id number]) Friable: No Erythema Location: Circumferential Induration: No Hemosiderin Staining: No Localized Edema: No Mottled: No Rash: No Pallor: No Scarring: No Rubor:  No Moisture Temperature / Pain No Abnormalities Noted: No Temperature: No Abnormality Dry / Scaly: No Tenderness on Palpation: Yes Maceration: No Moist: Yes Wound Preparation Ulcer Cleansing: Rinsed/Irrigated with Saline Topical Anesthetic Applied: Other: lidocaine 4%, Treatment Notes Wound #1 (Midline Sacrum) 1. Cleansed with: Clean wound with Normal Saline 2. Anesthetic Topical Lidocaine 4% cream to wound bed prior to debridement 3. Peri-wound Care: Skin Prep 4. Dressing Applied: Aquacel Ag 5. Secondary Dressing Applied Bordered Foam Dressing Electronic Signature(s) Signed: 05/22/2016 2:10:49 PM By: Curtis Sites Entered By: Curtis Sites on 05/22/2016 13:05:07 HENNESSEY, CANTRELL ([id number]) -------------------------------------------------------------------------------- Vitals Details Patient Name: [name] Date of Service: 05/22/2016 10:45 AM Medical Record Number: [id number] Patient Account Number: [id number] Date of Birth/Sex: May 09, 1924 (82 y.o. Female) Treating RN: Curtis Sites Primary Care Physician: SYSTEM, PCP Other Clinician: Referring Physician: Julieanne Cotton Treating Physician/Extender: Elayne Snare in Treatment: 7 Vital Signs Time Taken: 11:05 Temperature (F): 98.0 Height (in): 61 Pulse (bpm): 86 Weight (lbs): 113 Respiratory Rate (breaths/min): 16 Body Mass Index (BMI): 21.3 Blood Pressure (mmHg): 114/82 Reference Range: 80 - 120 mg / dl Electronic Signature(s) Signed: 05/22/2016 2:10:49 PM By: Curtis Sites Entered By: Curtis Sites on 05/22/2016 11:06:26

## 2016-05-22 NOTE — Progress Notes (Signed)
Tara Frederick, Tara Frederick ([id number]) Visit Report for 05/22/2016 Chief Complaint Document Details Patient Name: Tara Frederick, Tara Frederick 05/22/2016 10:45 Date of Service: AM Medical Record [id number] Number: Patient Account Number: [id number] 10-22-1923 (82 y.o. Treating RN: Tara Frederick Date of Birth/Sex: Female) Other Clinician: Primary Care Physician: SYSTEM, PCP Treating Tara Frederick, Tara Frederick Referring Physician: Julieanne Frederick Physician/Extender: Weeks in Treatment: 7 Information Obtained from: Patient Chief Complaint Patient is at the clinic for treatment of an open pressure ulcer to the sacral region for about 2 weeks Electronic Signature(s) Signed: 05/22/2016 11:22:57 AM By: Tara Sax MD Entered By: Tara Frederick on 05/22/2016 11:22:56 Tara, Frederick ([id number]) -------------------------------------------------------------------------------- HPI Details Patient Name: Tara Frederick, Tara Frederick 05/22/2016 10:45 Date of Service: AM Medical Record [id number] Number: Patient Account Number: [id number] [birth year] (82 y.o. Treating RN: Tara Frederick Date of Birth/Sex: Female) Other Clinician: Primary Care Physician: SYSTEM, PCP Treating Tara Frederick, Jocob Dambach Referring Physician: Julieanne Frederick Physician/Extender: Weeks in Treatment: 7 History of Present Illness Location: sacral region Quality: Patient reports experiencing a sharp pain to affected area(s). Severity: Patient states wound are getting worse. Duration: Patient has had the wound for < 2 weeks prior to presenting for treatment Timing: Pain in wound is Intermittent (comes and goes Context: The wound would happen gradually Modifying Factors: Other treatment(s) tried include:since L1 compression fracturesurgery for a Associated Signs and Symptoms: Patient reports having difficulty standing for long periods. HPI Description: 82 year old patient who was recently admitted to [facility] on July 28 was discharged on August 10. She was  treated for severe constipation, L1 compression fracture and is status post kyphoplasty on 8/8. Also treated for a UTI with Levaquin and continued on supportive care. Past medical history significant for hypertension, GERD, degenerative disc disease. Status post appendectomy, hemorrhoid surgery and a total abdominal hysterectomy with bilateral salpingo- oophorectomy. Present she is in a rehabilitation facility and ambulating with a walker. Her protein intake is said to be good and she is on vitamin supplements. She has an appropriate bed surface. 05/08/16: pt returns today for ongoing eval and management of a sacrum PU. wound is improving. no reports of systemic s/s of infection. she is accompanied by a family member today. no new wounds or skin breakdown. 05/15/16: returns for f/u today. she is planning to return home this week after undergoing rehab at a SNF. denies systemic s/s of infection. no new ulcers or skin breakdown. wound measures smaller today. Electronic Signature(s) Signed: 05/22/2016 11:23:16 AM By: Tara Sax MD Entered By: Tara Frederick on 05/22/2016 11:23:16 Tara, Frederick ([id number]) -------------------------------------------------------------------------------- Physical Exam Details Patient Name: Tara Frederick, Tara Frederick 05/22/2016 10:45 Date of Service: AM Medical Record [id number] Number: Patient Account Number: [id number] 17-Jun-1924 (82 y.o. Treating RN: Tara Frederick Date of Birth/Sex: Female) Other Clinician: Primary Care Physician: SYSTEM, PCP Treating Tara Frederick, Christy Ehrsam Referring Physician: Julieanne Frederick Physician/Extender: Weeks in Treatment: 7 Electronic Signature(s) Signed: 05/22/2016 11:23:25 AM By: Tara Sax MD Entered By: Tara Frederick on 05/22/2016 11:23:25 Tara, Frederick ([id number]) -------------------------------------------------------------------------------- Physician Orders Details Patient Name: Tara Frederick, Tara Frederick 05/22/2016 10:45 Date of  Service: AM Medical Record [id number] Number: Patient Account Number: [id number] 24-Jan-1924 (82 y.o. Treating RN: Tara Frederick Date of Birth/Sex: Female) Other Clinician: Primary Care Physician: SYSTEM, PCP Treating Tara Frederick, Michail Boyte Referring Physician: Julieanne Frederick Physician/Extender: Weeks in Treatment: 7 Verbal / Phone Orders: Yes Clinician: Curtis Frederick Read Back and Verified: Yes Diagnosis Coding Wound Cleansing Wound #1 Midline Sacrum o Clean wound with Normal Saline. Anesthetic Wound #1 Midline Sacrum o Topical Lidocaine 4% cream applied to wound bed prior to debridement Skin Barriers/Peri-Wound  Care Wound #1 Midline Sacrum o Skin Prep Primary Wound Dressing Wound #1 Midline Sacrum o Aquacel Ag - or silver alginate Secondary Dressing Wound #1 Midline Sacrum o Boardered Foam Dressing Dressing Change Frequency Wound #1 Midline Sacrum o Change dressing every day. Follow-up Appointments Wound #1 Midline Sacrum o Return Appointment in 1 week. Off-Loading Wound #1 Midline Sacrum o Turn and reposition every 2 hours Tara Frederick ([id number]) Additional Orders / Instructions Wound #1 Midline Sacrum o Increase protein intake. Medications-please add to medication list. Wound #1 Midline Sacrum o Other: - Please add vitamin C and zinc and vitamin A to patient's medications daily Electronic Signature(s) Signed: 05/22/2016 2:10:49 PM By: Tara Frederick, Tara Signed: 05/22/2016 3:40:17 PM By: Tara SaxParker, Giavanni Zeitlin MD Entered By: Tara Frederick, Tara on 05/22/2016 11:16:26 Tara Frederick, Tara Frederick ([id number]) -------------------------------------------------------------------------------- Problem List Details Patient Name: Tara Frederick, Tara Frederick 05/22/2016 10:45 Date of Service: AM Medical Record [id number] Number: Patient Account Number: [id number] November 27, 1923 (82 y.o. Treating RN: Tara Frederick, Tara Date of Birth/Sex: Female) Other Clinician: Primary Care Physician: SYSTEM, PCP  Treating Tara RalphPARKER, Lavar Rosenzweig Referring Physician: Julieanne CottonEVESHWAR, SANJEEV Physician/Extender: Weeks in Treatment: 7 Active Problems ICD-10 Encounter Code Description Active Date Diagnosis L89.153 Pressure ulcer of sacral region, stage 3 04/03/2016 Yes S32.010D Wedge compression fracture of first lumbar vertebra, 04/03/2016 Yes subsequent encounter for fracture with routine healing Inactive Problems Resolved Problems Electronic Signature(s) Signed: 05/22/2016 11:22:46 AM By: Tara SaxParker, Dekota Shenk MD Entered By: Tara SaxParker, Ailed Defibaugh on 05/22/2016 11:22:46 Tara Frederick, Tara Frederick ([id number]) -------------------------------------------------------------------------------- Progress Note Details Patient Name: Tara Frederick, Tara Frederick 05/22/2016 10:45 Date of Service: AM Medical Record [id number] Number: Patient Account Number: [id number] November 27, 1923 (82 y.o. Treating RN: Tara Frederick, Tara Date of Birth/Sex: Female) Other Clinician: Primary Care Physician: SYSTEM, PCP Treating Tara RalphPARKER, Bayden Gil Referring Physician: Julieanne CottonEVESHWAR, SANJEEV Physician/Extender: Weeks in Treatment: 7 Subjective Chief Complaint Information obtained from Patient Patient is at the clinic for treatment of an open pressure ulcer to the sacral region for about 2 weeks History of Present Illness (HPI) The following HPI elements were documented for the patient's wound: Location: sacral region Quality: Patient reports experiencing a sharp pain to affected area(s). Severity: Patient states wound are getting worse. Duration: Patient has had the wound for < 2 weeks prior to presenting for treatment Timing: Pain in wound is Intermittent (comes and goes Context: The wound would happen gradually Modifying Factors: Other treatment(s) tried include:since L1 compression fracturesurgery for a Associated Signs and Symptoms: Patient reports having difficulty standing for long periods. 82 year old patient who [facility] was  discharged on August 10. She was treated for severe constipation, L1 compression fracture and is status post kyphoplasty on 8/8. Also treated for a UTI with Levaquin and continued on supportive care. Past medical history significant for hypertension, GERD, degenerative disc disease. Status post appendectomy, hemorrhoid surgery and a total abdominal hysterectomy with bilateral salpingo- oophorectomy. Present she is in a rehabilitation facility and ambulating with a walker. Her protein intake is said to be good and she is on vitamin supplements. She has an appropriate bed surface. 05/08/16: pt returns today for ongoing eval and management of a sacrum PU. wound is improving. no reports of systemic s/s of infection. she is accompanied by a family member today. no new wounds or skin breakdown. 05/15/16: returns for f/u today. she is planning to return home this week after undergoing rehab at a SNF. denies systemic s/s of infection. no new ulcers or skin breakdown. wound measures smaller today. Objective Tara Frederick, Tara Frederick ([id number]) Constitutional Vitals Time Taken: 11:05 AM, Height:  61 in, Weight: 113 lbs, BMI: 21.3, Temperature: 98.0 F, Pulse: 86 bpm, Respiratory Rate: 16 breaths/min, Blood Pressure: 114/82 mmHg. Integumentary (Hair, Skin) Wound #1 status is Open. Original cause of wound was Pressure Injury. The wound is located on the Midline Sacrum. The wound measures 0.3cm length x 0.3cm width x 0.2cm depth; 0.071cm^2 area and 0.014cm^3 volume. The wound is limited to skin breakdown. There is no tunneling or undermining noted. There is a large amount of serous drainage noted. The wound margin is flat and intact. There is large (67- 100%) pink granulation within the wound bed. There is a small (1-33%) amount of necrotic tissue within the wound bed including Adherent Slough. The periwound skin appearance exhibited: Moist, Erythema. The periwound skin appearance did not exhibit: Callus, Crepitus,  Excoriation, Fluctuance, Friable, Induration, Localized Edema, Rash, Scarring, Dry/Scaly, Maceration, Atrophie Blanche, Cyanosis, Ecchymosis, Hemosiderin Staining, Mottled, Pallor, Rubor. The surrounding wound skin color is noted with erythema which is circumferential. Periwound temperature was noted as No Abnormality. The periwound has tenderness on palpation. Small sacral pressure ulcer 5 mm size. Treating with Prisma daily. Patient is keeping pressure free. Assessment Active Problems ICD-10 L89.153 - Pressure ulcer of sacral region, stage 3 S32.010D - Wedge compression fracture of first lumbar vertebra, subsequent encounter for fracture with routine healing Plan Wound Cleansing: Wound #1 Midline Sacrum: Clean wound with Normal Saline. Anesthetic: Wound #1 Midline Sacrum: Topical Lidocaine 4% cream applied to wound bed prior to debridement Skin Barriers/Peri-Wound Care: Wound #1 Midline Sacrum: Skin Prep Tara Frederick, Tara Frederick ([id number]) Primary Wound Dressing: Wound #1 Midline Sacrum: Aquacel Ag - or silver alginate Secondary Dressing: Wound #1 Midline Sacrum: Boardered Foam Dressing Dressing Change Frequency: Wound #1 Midline Sacrum: Change dressing every day. Follow-up Appointments: Wound #1 Midline Sacrum: Return Appointment in 1 week. Off-Loading: Wound #1 Midline Sacrum: Turn and reposition every 2 hours Additional Orders / Instructions: Wound #1 Midline Sacrum: Increase protein intake. Medications-please add to medication list.: Wound #1 Midline Sacrum: Other: - Please add vitamin C and zinc and vitamin A to patient's medications daily Follow-Up Appointments: A follow-up appointment should be scheduled. Electronic Signature(s) Signed: 05/22/2016 11:25:05 AM By: Tara Sax MD Entered By: Tara Frederick on 05/22/2016 11:25:05 MAHATHI, POKORNEY ([id number]) -------------------------------------------------------------------------------- SuperBill Details Patient Name:  Tara German Date of Service: 05/22/2016 Medical Record Number: [id number] Patient Account Number: [id number] Date of Birth/Sex: 03-24-1924 (82 y.o. Female) Treating RN: Tara Frederick Primary Care Physician: SYSTEM, PCP Other Clinician: Referring Physician: Julieanne Frederick Treating Physician/Extender: Elayne Snare in Treatment: 7 Diagnosis Coding ICD-10 Codes Code Description L89.153 Pressure ulcer of sacral region, stage 3 Wedge compression fracture of first lumbar vertebra, subsequent encounter for fracture S32.010D with routine healing Facility Procedures CPT4 Code: 08657846 Description: 99213 - WOUND CARE VISIT-LEV 3 EST PT Modifier: Quantity: 1 Physician Procedures CPT4 Code: [id number] Description: 41324 - WC PHYS LEVEL 2 - EST PT ICD-10 Description Diagnosis L89.153 Pressure ulcer of sacral region, stage 3 Modifier: Quantity: 1 Electronic Signature(s) Signed: 05/22/2016 11:25:25 AM By: Tara Sax MD Entered By: Tara Frederick on 05/22/2016 11:25:25

## 2016-05-22 NOTE — Progress Notes (Signed)
See I heal 

## 2016-05-29 ENCOUNTER — Encounter: Payer: Medicare Other | Admitting: Surgery

## 2016-05-29 DIAGNOSIS — L89153 Pressure ulcer of sacral region, stage 3: Secondary | ICD-10-CM | POA: Diagnosis not present

## 2016-05-29 NOTE — Progress Notes (Signed)
Tara Frederick, Tara Frederick ([id number]) Visit Report for 05/29/2016 Arrival Information Details Patient Name: Tara Frederick, Tara Frederick 05/29/2016 2:15 Date of Service: PM Medical Record [id number] Number: Patient Account Number: [id number] [birth year] (82 y.o. Treating RN: Curtis Sitesorthy, Joanna Date of Birth/Sex: Female) Other Clinician: Primary Care Physician: SYSTEM, PCP Treating Evlyn KannerBritto, Errol Referring Physician: Julieanne CottonEVESHWAR, SANJEEV Physician/Extender: Weeks in Treatment: 8 Visit Information History Since Last Visit Added or deleted any medications: No Patient Arrived: Walker Any new allergies or adverse reactions: No Arrival Time: 14:40 Had a fall or experienced change in No Accompanied By: dtr activities of daily living that may affect Transfer Assistance: None risk of falls: Patient Identification Verified: Yes Signs or symptoms of abuse/neglect since last No Secondary Verification Process Completed: Yes visito Hospitalized since last visit: No Pain Present Now: No Electronic Signature(s) Signed: 05/29/2016 3:02:48 PM By: Curtis Sitesorthy, Joanna Entered By: Curtis Sitesorthy, Joanna on 05/29/2016 14:40:42 Tara Frederick, Tara Frederick ([id number]) -------------------------------------------------------------------------------- Clinic Level of Care Assessment Details Patient Name: Tara Frederick, Tara Frederick 05/29/2016 2:15 Date of Service: PM Medical Record [id number] Number: Patient Account Number: [id number] [birth year] (82 y.o. Treating RN: Curtis Sitesorthy, Joanna Date of Birth/Sex: Female) Other Clinician: Primary Care Physician: SYSTEM, PCP Treating Britto, Errol Referring Physician: Julieanne CottonEVESHWAR, SANJEEV Physician/Extender: Weeks in Treatment: 8 Clinic Level of Care Assessment Items TOOL 4 Quantity Score []  - Use when only an EandM is performed on FOLLOW-UP visit 0 ASSESSMENTS - Nursing Assessment / Reassessment X - Reassessment of Co-morbidities (includes updates in patient status) 1 10 X - Reassessment of Adherence to Treatment Plan 1  5 ASSESSMENTS - Wound and Skin Assessment / Reassessment X - Simple Wound Assessment / Reassessment - one wound 1 5 []  - Complex Wound Assessment / Reassessment - multiple wounds 0 []  - Dermatologic / Skin Assessment (not related to wound area) 0 ASSESSMENTS - Focused Assessment []  - Circumferential Edema Measurements - multi extremities 0 []  - Nutritional Assessment / Counseling / Intervention 0 []  - Lower Extremity Assessment (monofilament, tuning fork, pulses) 0 []  - Peripheral Arterial Disease Assessment (using hand held doppler) 0 ASSESSMENTS - Ostomy and/or Continence Assessment and Care []  - Incontinence Assessment and Management 0 []  - Ostomy Care Assessment and Management (repouching, etc.) 0 PROCESS - Coordination of Care X - Simple Patient / Family Education for ongoing care 1 15 []  - Complex (extensive) Patient / Family Education for ongoing care 0 []  - Staff obtains ChiropractorConsents, Records, Test Results / Process Orders 0 []  - Staff telephones HHA, Nursing Homes / Clarify orders / etc 0 Tara Frederick, Tara Frederick ([id number]) []  - Routine Transfer to another Facility (non-emergent condition) 0 []  - Routine Hospital Admission (non-emergent condition) 0 []  - New Admissions / Manufacturing engineernsurance Authorizations / Ordering NPWT, Apligraf, etc. 0 []  - Emergency Hospital Admission (emergent condition) 0 X - Simple Discharge Coordination 1 10 []  - Complex (extensive) Discharge Coordination 0 PROCESS - Special Needs []  - Pediatric / Minor Patient Management 0 []  - Isolation Patient Management 0 []  - Hearing / Language / Visual special needs 0 []  - Assessment of Community assistance (transportation, D/C planning, etc.) 0 []  - Additional assistance / Altered mentation 0 []  - Support Surface(s) Assessment (bed, cushion, seat, etc.) 0 INTERVENTIONS - Wound Cleansing / Measurement X - Simple Wound Cleansing - one wound 1 5 []  - Complex Wound Cleansing - multiple wounds 0 X - Wound Imaging (photographs - any  number of wounds) 1 5 []  - Wound Tracing (instead of photographs) 0 X - Simple Wound Measurement - one wound 1 5 []  - Complex Wound Measurement -  multiple wounds 0 INTERVENTIONS - Wound Dressings []  - Small Wound Dressing one or multiple wounds 0 []  - Medium Wound Dressing one or multiple wounds 0 []  - Large Wound Dressing one or multiple wounds 0 []  - Application of Medications - topical 0 []  - Application of Medications - injection 0 Tara Frederick, Tara Frederick ([id number]) INTERVENTIONS - Miscellaneous []  - External ear exam 0 []  - Specimen Collection (cultures, biopsies, blood, body fluids, etc.) 0 []  - Specimen(s) / Culture(s) sent or taken to Lab for analysis 0 []  - Patient Transfer (multiple staff / Michiel Sites Lift / Similar devices) 0 []  - Simple Staple / Suture removal (25 or less) 0 []  - Complex Staple / Suture removal (26 or more) 0 []  - Hypo / Hyperglycemic Management (close monitor of Blood Glucose) 0 []  - Ankle / Brachial Index (ABI) - do not check if billed separately 0 X - Vital Signs 1 5 Has the patient been seen at the hospital within the last three years: Yes Total Score: 65 Level Of Care: New/Established - Level 2 Electronic Signature(s) Signed: 05/29/2016 3:02:48 PM By: Curtis Sites Entered By: Curtis Sites on 05/29/2016 14:53:07 Tara Frederick ([id number]) -------------------------------------------------------------------------------- Encounter Discharge Information Details Patient Name: Tara, Frederick 05/29/2016 2:15 Date of Service: PM Medical Record [id number] Number: Patient Account Number: [id number] Apr 07, 1924 (82 y.o. Treating RN: Curtis Sites Date of Birth/Sex: Female) Other Clinician: Primary Care Physician: SYSTEM, PCP Treating Evlyn Kanner Referring Physician: Julieanne Cotton Physician/Extender: Weeks in Treatment: 8 Encounter Discharge Information Items Discharge Pain Level: 0 Discharge Condition: Stable Ambulatory Status: Walker Discharge  Destination: Home Transportation: Private Auto Accompanied By: dtr Schedule Follow-up Appointment: No Medication Reconciliation completed and provided to Patient/Care No Worley Radermacher: Provided on Clinical Summary of Care: 05/29/2016 Form Type Recipient Paper Patient FH Electronic Signature(s) Signed: 05/29/2016 2:58:42 PM By: [name] Entered By: [name] on 05/29/2016 14:58:41 Tara Frederick ([id number]) -------------------------------------------------------------------------------- Multi-Disciplinary Care Plan Details Patient Name: Tara Frederick, Tara Frederick 05/29/2016 2:15 Date of Service: PM Medical Record [id number] Number: Patient Account Number: [id number] 12-20-1923 (82 y.o. Treating RN: Curtis Sites Date of Birth/Sex: Female) Other Clinician: Primary Care Physician: SYSTEM, PCP Treating Evlyn Kanner Referring Physician: Julieanne Cotton Physician/Extender: Weeks in Treatment: 8 Active Inactive Electronic Signature(s) Signed: 05/29/2016 3:02:48 PM By: Curtis Sites Entered By: Curtis Sites on 05/29/2016 14:52:22 Tara Frederick, Tara Frederick ([id number]) -------------------------------------------------------------------------------- Pain Assessment Details Patient Name: Tara Frederick, Tara Frederick 05/29/2016 2:15 Date of Service: PM Medical Record [id number] Number: Patient Account Number: [id number] 1924/04/18 (82 y.o. Treating RN: Curtis Sites Date of Birth/Sex: Female) Other Clinician: Primary Care Physician: SYSTEM, PCP Treating Evlyn Kanner Referring Physician: Julieanne Cotton Physician/Extender: Weeks in Treatment: 8 Active Problems Location of Pain Severity and Description of Pain Patient Has Paino No Site Locations Pain Management and Medication Current Pain Management: Notes Topical or injectable lidocaine is offered to patient for acute pain when surgical debridement is performed. If needed, Patient is instructed to use over the counter pain medication for the  following 24-48 hours after debridement. Wound care MDs do not prescribed pain medications. Patient has chronic pain or uncontrolled pain. Patient has been instructed to make an appointment with their Primary Care Physician for pain management. Electronic Signature(s) Signed: 05/29/2016 3:02:48 PM By: Curtis Sites Entered By: Curtis Sites on 05/29/2016 14:41:25 Tara Frederick ([id number]) -------------------------------------------------------------------------------- Patient/Caregiver Education Details Patient Name: Tara Frederick, Tara Frederick 05/29/2016 2:15 Date of Service: PM Medical Record [id number] Number: Patient Account Number: [id number] [birth year] (82 y.o. Treating RN: Curtis Sites Date of Birth/Gender: Female) Other Clinician: Primary Care Physician: SYSTEM, PCP Treating Britto,  Errol Referring Physician: Julieanne Cotton Physician/Extender: Tania Ade in Treatment: 8 Education Assessment Education Provided To: Patient Education Topics Provided Basic Hygiene: Handouts: Other: skin care of newly healed ulcer site Methods: Explain/Verbal Responses: State content correctly Electronic Signature(s) Signed: 05/29/2016 3:02:48 PM By: Curtis Sites Entered By: Curtis Sites on 05/29/2016 14:53:56 Doswell, Jolana ([id number]) -------------------------------------------------------------------------------- Wound Assessment Details Patient Name: ALISSE, TUITE 05/29/2016 2:15 Date of Service: PM Medical Record [id number] Number: Patient Account Number: [id number] 06-Nov-1923 (82 y.o. Treating RN: Curtis Sites Date of Birth/Sex: Female) Other Clinician: Primary Care Physician: SYSTEM, PCP Treating Britto, Errol Referring Physician: Julieanne Cotton Physician/Extender: Weeks in Treatment: 8 Wound Status Wound Number: 1 Primary Pressure Ulcer Etiology: Wound Location: Midline Sacrum Wound Healed - Epithelialized Wounding Event: Pressure Injury Status: Date Acquired:  03/20/2016 Comorbid Cataracts, Glaucoma, Asthma, Weeks Of Treatment: 8 History: Hypertension, Osteoarthritis Clustered Wound: No Photos Wound Measurements Length: (cm) 0 % Reduction in A Width: (cm) 0 % Reduction in V Depth: (cm) 0 Epithelializatio Area: (cm) 0 Tunneling: Volume: (cm) 0 Undermining: rea: 100% olume: 100% n: Large (67-100%) No No Wound Description Classification: Category/Stage III Foul Odor After Wound Margin: Flat and Intact Exudate Amount: Large Exudate Type: Serous Exudate Color: amber Cleansing: No Wound Bed Granulation Amount: None Present (0%) Exposed Structure Necrotic Amount: None Present (0%) Fascia Exposed: No Fat Layer Exposed: No Imm, [place] ([id number]) Tendon Exposed: No Muscle Exposed: No Joint Exposed: No Bone Exposed: No Limited to Skin Breakdown Periwound Skin Texture Texture Color No Abnormalities Noted: No No Abnormalities Noted: No Callus: No Atrophie Blanche: No Crepitus: No Cyanosis: No Excoriation: No Ecchymosis: No Fluctuance: No Erythema: Yes Friable: No Erythema Location: Circumferential Induration: No Hemosiderin Staining: No Localized Edema: No Mottled: No Rash: No Pallor: No Scarring: No Rubor: No Moisture Temperature / Pain No Abnormalities Noted: No Temperature: No Abnormality Dry / Scaly: No Tenderness on Palpation: Yes Maceration: No Moist: Yes Wound Preparation Ulcer Cleansing: Rinsed/Irrigated with Saline Topical Anesthetic Applied: None Electronic Signature(s) Signed: 05/29/2016 3:02:48 PM By: Curtis Sites Entered By: Curtis Sites on 05/29/2016 14:51:52 Sigman, Jazlin ([id number]) -------------------------------------------------------------------------------- Vitals Details Patient Name: ZARAHI, FUERST 05/29/2016 2:15 Date of Service: PM Medical Record [id number] Number: Patient Account Number: [id number] 08-30-1923 (82 y.o. Treating RN: Curtis Sites Date of  Birth/Sex: Female) Other Clinician: Primary Care Physician: SYSTEM, PCP Treating Britto, Errol Referring Physician: Julieanne Cotton Physician/Extender: Weeks in Treatment: 8 Vital Signs Time Taken: 14:41 Temperature (F): 98.1 Height (in): 61 Pulse (bpm): 79 Weight (lbs): 113 Respiratory Rate (breaths/min): 16 Body Mass Index (BMI): 21.3 Blood Pressure (mmHg): 130/61 Reference Range: 80 - 120 mg / dl Electronic Signature(s) Signed: 05/29/2016 3:02:48 PM By: Curtis Sites Entered By: Curtis Sites on 05/29/2016 14:42:10

## 2016-05-30 NOTE — Progress Notes (Signed)
Tara Frederick, Tara Frederick ([id number]) Visit Report for 05/29/2016 Chief Complaint Document Details Patient Name: Tara Frederick, Tara Frederick 05/29/2016 2:15 Date of Service: PM Medical Record [id number] Number: Patient Account Number: [id number] May 22, 1924 (82 y.o. Treating RN: Tara Frederick Date of Birth/Sex: Female) Other Clinician: Primary Care Physician: Tara Frederick Treating Tara Frederick Referring Physician: Julieanne Frederick Physician/Extender: Tara Frederick: 8 Information Obtained from: Patient Chief Complaint Patient is at the clinic for Frederick of an open pressure ulcer to the sacral region for about 2 Tara Electronic Signature(s) Signed: 05/29/2016 3:02:15 PM By: Tara Kanner MD, FACS Entered By: Tara Frederick on 05/29/2016 15:02:14 Tara Frederick ([id number]) -------------------------------------------------------------------------------- HPI Details Patient Name: Tara Frederick, Tara Frederick 05/29/2016 2:15 Date of Service: PM Medical Record [id number] Number: Patient Account Number: [id number] [birth year] (82 y.o. Treating RN: Tara Frederick Date of Birth/Sex: Female) Other Clinician: Primary Care Physician: Tara Frederick Treating Tara Frederick Referring Physician: Julieanne Frederick Physician/Extender: Tara Frederick: 8 History of Present Illness Location: sacral region Quality: Patient reports experiencing a sharp pain to affected area(s). Severity: Patient states wound are getting worse. Duration: Patient has had the wound for < 2 Tara prior to presenting for Frederick Timing: Pain in wound is Intermittent (comes and goes Context: The wound would happen gradually Modifying Factors: Other Frederick(s) tried include:since L1 compression fracturesurgery for a Associated Signs and Symptoms: Patient reports having difficulty standing for long periods. HPI Description: 82 year old patient who was recently admitted to [facility] on July 28 was discharged on August 10. She was  treated for severe constipation, L1 compression fracture and is status post kyphoplasty on 8/8. Also treated for a UTI with Levaquin and continued on supportive care. Past medical history significant for hypertension, GERD, degenerative disc disease. Status post appendectomy, hemorrhoid surgery and a total abdominal hysterectomy with bilateral salpingo- oophorectomy. Present she is in a rehabilitation facility and ambulating with a walker. Her protein intake is said to be good and she is on vitamin supplements. She has an appropriate bed surface. 05/08/16: pt returns today for ongoing eval and management of a sacrum PU. wound is improving. no reports of systemic s/s of infection. she is accompanied by a family member today. no new wounds or skin breakdown. 05/15/16: returns for f/u today. she is planning to return home this week after undergoing rehab at a SNF. denies systemic s/s of infection. no new ulcers or skin breakdown. wound measures smaller today. Electronic Signature(s) Signed: 05/29/2016 3:02:26 PM By: Tara Kanner MD, FACS Entered By: Tara Frederick on 05/29/2016 15:02:26 Tara Frederick, Tara Frederick ([id number]) -------------------------------------------------------------------------------- Physical Exam Details Patient Name: Tara Frederick, Tara Frederick 05/29/2016 2:15 Date of Service: PM Medical Record [id number] Number: Patient Account Number: [id number] Jun 23, 1924 (82 y.o. Treating RN: Tara Frederick Date of Birth/Sex: Female) Other Clinician: Primary Care Physician: Tara Frederick Treating Tara Frederick Referring Physician: Julieanne Frederick Physician/Extender: Tara Frederick: 8 Constitutional . Pulse regular. Respirations normal and unlabored. Afebrile. . Eyes Nonicteric. Reactive to light. Ears, Nose, Mouth, and Throat Lips, teeth, and gums WNL.Marland Kitchen Moist mucosa without lesions. Neck supple and nontender. No palpable supraclavicular or cervical adenopathy. Normal sized without  goiter. Respiratory WNL. No retractions.. Breath sounds WNL, No rubs, rales, rhonchi, or wheeze.. Cardiovascular Heart rhythm and rate regular, no murmur or gallop.. Pedal Pulses WNL. No clubbing, cyanosis or edema. Chest Breasts symmetical and no nipple discharge.. Breast tissue WNL, no masses, lumps, or tenderness.. Lymphatic No adneopathy. No adenopathy. No adenopathy. Musculoskeletal Adexa without tenderness or enlargement.. Digits and nails w/o clubbing, cyanosis, infection, petechiae, ischemia, or inflammatory conditions.. Integumentary (Hair,  Skin) No suspicious lesions. No crepitus or fluctuance. No peri-wound warmth or erythema. No masses.Marland Kitchen Psychiatric Judgement and insight Intact.. No evidence of depression, anxiety, or agitation.. Notes the sacral decubitus ulcer is healed Electronic Signature(s) Signed: 05/29/2016 3:02:50 PM By: Tara Kanner MD, FACS Entered By: Tara Frederick on 05/29/2016 15:02:49 Tara Frederick, Tara Frederick ([id number]) -------------------------------------------------------------------------------- Physician Orders Details Patient Name: Tara Frederick, Tara Frederick 05/29/2016 2:15 Date of Service: PM Medical Record [id number] Number: Patient Account Number: [id number] Sep 14, 1923 (82 y.o. Treating RN: Tara Frederick Date of Birth/Sex: Female) Other Clinician: Primary Care Physician: Tara Frederick Treating Tara Frederick Referring Physician: Julieanne Frederick Physician/Extender: Tania Ade in Frederick: 8 Verbal / Phone Orders: Yes Clinician: Curtis Frederick Read Back and Verified: Yes Diagnosis Coding Discharge From [facility] Services o Discharge from Wound Care Center Electronic Signature(s) Signed: 05/29/2016 3:02:48 PM By: Tara Frederick Signed: 05/29/2016 4:28:25 PM By: Tara Kanner MD, FACS Entered By: Tara Frederick on 05/29/2016 14:52:45 Tara Frederick ([id number]) -------------------------------------------------------------------------------- Problem List  Details Patient Name: Tara Frederick, Tara Frederick 05/29/2016 2:15 Date of Service: PM Medical Record [id number] Number: Patient Account Number: [id number] 28-Jul-1924 (82 y.o. Treating RN: Tara Frederick Date of Birth/Sex: Female) Other Clinician: Primary Care Physician: Tara Frederick Treating Tara Frederick Referring Physician: Julieanne Frederick Physician/Extender: Tara Frederick: 8 Active Problems ICD-10 Encounter Code Description Active Date Diagnosis L89.153 Pressure ulcer of sacral region, stage 3 04/03/2016 Yes S32.010D Wedge compression fracture of first lumbar vertebra, 04/03/2016 Yes subsequent encounter for fracture with routine healing Inactive Problems Resolved Problems Electronic Signature(s) Signed: 05/29/2016 3:01:12 PM By: Tara Kanner MD, FACS Entered By: Tara Frederick on 05/29/2016 15:01:11 Tara Frederick ([id number]) -------------------------------------------------------------------------------- Progress Note Details Patient Name: Tara Frederick, Tara Frederick 05/29/2016 2:15 Date of Service: PM Medical Record [id number] Number: Patient Account Number: [id number] 03/06/1924 (82 y.o. Treating RN: Tara Frederick Date of Birth/Sex: Female) Other Clinician: Primary Care Physician: Tara Frederick Treating Tara Frederick Referring Physician: Julieanne Frederick Physician/Extender: Tara Frederick: 8 Subjective Chief Complaint Information obtained from Patient Patient is at the clinic for Frederick of an open pressure ulcer to the sacral region for about 2 Tara History of Present Illness (HPI) The following HPI elements were documented for the patient's wound: Location: sacral region Quality: Patient reports experiencing a sharp pain to affected area(s). Severity: Patient states wound are getting worse. Duration: Patient has had the wound for < 2 Tara prior to presenting for Frederick Timing: Pain in wound is Intermittent (comes and goes Context: The wound would happen  gradually Modifying Factors: Other Frederick(s) tried include:since L1 compression fracturesurgery for a Associated Signs and Symptoms: Patient reports having difficulty standing for long periods. 82 year old patient who was recently admitted to [facility] on July 28 was discharged on August 10. She was treated for severe constipation, L1 compression fracture and is status post kyphoplasty on 8/8. Also treated for a UTI with Levaquin and continued on supportive care. Past medical history significant for hypertension, GERD, degenerative disc disease. Status post appendectomy, hemorrhoid surgery and a total abdominal hysterectomy with bilateral salpingo- oophorectomy. Present she is in a rehabilitation facility and ambulating with a walker. Her protein intake is said to be good and she is on vitamin supplements. She has an appropriate bed surface. 05/08/16: pt returns today for ongoing eval and management of a sacrum PU. wound is improving. no reports of systemic s/s of infection. she is accompanied by a family member today. no new wounds or skin breakdown. 05/15/16: returns for f/u today. she is planning to return home this week after undergoing rehab at a SNF.  denies systemic s/s of infection. no new ulcers or skin breakdown. wound measures smaller today. Objective Tara Frederick, Tara Frederick ([id number]) Constitutional Pulse regular. Respirations normal and unlabored. Afebrile. Vitals Time Taken: 2:41 PM, Height: 61 in, Weight: 113 lbs, BMI: 21.3, Temperature: 98.1 F, Pulse: 79 bpm, Respiratory Rate: 16 breaths/min, Blood Pressure: 130/61 mmHg. Eyes Nonicteric. Reactive to light. Ears, Nose, Mouth, and Throat Lips, teeth, and gums WNL.Marland Kitchen. Moist mucosa without lesions. Neck supple and nontender. No palpable supraclavicular or cervical adenopathy. Normal sized without goiter. Respiratory WNL. No retractions.. Breath sounds WNL, No rubs, rales, rhonchi, or wheeze.. Cardiovascular Heart rhythm  and rate regular, no murmur or gallop.. Pedal Pulses WNL. No clubbing, cyanosis or edema. Chest Breasts symmetical and no nipple discharge.. Breast tissue WNL, no masses, lumps, or tenderness.. Lymphatic No adneopathy. No adenopathy. No adenopathy. Musculoskeletal Adexa without tenderness or enlargement.. Digits and nails w/o clubbing, cyanosis, infection, petechiae, ischemia, or inflammatory conditions.Marland Kitchen. Psychiatric Judgement and insight Intact.. No evidence of depression, anxiety, or agitation.. General Notes: the sacral decubitus ulcer is healed Integumentary (Hair, Skin) No suspicious lesions. No crepitus or fluctuance. No peri-wound warmth or erythema. No masses.. Wound #1 status is Healed - Epithelialized. Original cause of wound was Pressure Injury. The wound is located on the Midline Sacrum. The wound measures 0cm length x 0cm width x 0cm depth; 0cm^2 area and 0cm^3 volume. The wound is limited to skin breakdown. There is no tunneling or undermining noted. There is a large amount of serous drainage noted. The wound margin is flat and intact. There is no granulation within the wound bed. There is no necrotic tissue within the wound bed. The periwound skin appearance exhibited: Moist, Erythema. The periwound skin appearance did not exhibit: Callus, Crepitus, Excoriation, Fluctuance, Friable, Induration, Localized Edema, Rash, Scarring, Dry/Scaly, Maceration, Atrophie Blanche, Cyanosis, Ecchymosis, Hemosiderin Staining, Mottled, Pallor, Rubor. The surrounding wound skin color is noted with erythema which is circumferential. Periwound temperature was noted as No Tara Frederick, Tara Frederick ([id number]) Abnormality. The periwound has tenderness on palpation. Assessment Active Problems ICD-10 L89.153 - Pressure ulcer of sacral region, stage 3 S32.010D - Wedge compression fracture of first lumbar vertebra, subsequent encounter for fracture with routine healing [facility] Discharge from W[facility] Care Center the ulcerated area in the sacral region is completely healed and I have recommended she continue to use offloading bordered foam there for the next few Tara. She is discharge from the wound care center and will be seen back as needed Electronic Signature(s) Signed: 05/29/2016 3:03:47 PM By: Tara KannerBritto, Lewis Keats MD, FACS Entered By: Tara KannerBritto, Leslea Vowles on 05/29/2016 15:03:47 Tara Frederick, Tara Frederick ([id number]) -------------------------------------------------------------------------------- SuperBill Details Patient Name: Tara Frederick, Tara Frederick Date of Service: 05/29/2016 Medical Record Number: [id number] Patient Account Number: [id number] Date of Birth/Sex: March 17, 1924 (82 y.o. Female) Treating RN: Tara Sitesorthy, Joanna Primary Care Physician: Tara Frederick Other Clinician: Referring Physician: Julieanne CottonEVESHWAR, SANJEEV Treating Physician/Extender: Rudene ReBritto, Leanthony Rhett Tara Frederick: 8 Diagnosis Coding ICD-10 Codes Code Description L89.153 Pressure ulcer of sacral region, stage 3 Wedge compression fracture of first lumbar vertebra, subsequent encounter for fracture S32.010D with routine healing Facility Procedures CPT4 Code: [id number] Description: [id number] - WOUND CARE VISIT-LEV 2 EST PT Modifier: Quantity: 1 Physician Procedures CPT4: Description Modifier Quantity Code [id number] - WC PHYS LEVEL 2 - EST PT 1 ICD-10 Description Diagnosis L89.153 Pressure ulcer of sacral region, stage 3 S32.010D Wedge compression fracture of first lumbar vertebra, subsequent encounter for  fracture with routine healing Electronic Signature(s) Signed: 05/29/2016 3:04:03 PM By: Tara KannerBritto, Dulcemaria Bula MD, FACS Entered By:  Tara Frederick on 05/29/2016 15:04:03

## 2016-06-14 ENCOUNTER — Ambulatory Visit (HOSPITAL_COMMUNITY): Admission: RE | Admit: 2016-06-14 | Payer: Medicare Other | Source: Ambulatory Visit

## 2016-06-20 ENCOUNTER — Ambulatory Visit (HOSPITAL_COMMUNITY)
Admission: RE | Admit: 2016-06-20 | Discharge: 2016-06-20 | Disposition: A | Discharge status: Home / Self Care | Payer: Medicare Other | Source: Ambulatory Visit | Attending: Interventional Radiology | Admitting: Interventional Radiology

## 2016-06-20 ENCOUNTER — Other Ambulatory Visit (HOSPITAL_COMMUNITY): Payer: Self-pay | Admitting: Interventional Radiology

## 2016-06-20 DIAGNOSIS — IMO0002 Reserved for concepts with insufficient information to code with codable children: Secondary | ICD-10-CM

## 2016-06-20 DIAGNOSIS — M545 Low back pain, unspecified: Secondary | ICD-10-CM

## 2016-06-20 DIAGNOSIS — S32020A Wedge compression fracture of second lumbar vertebra, initial encounter for closed fracture: Secondary | ICD-10-CM

## 2016-06-20 HISTORY — PX: IR GENERIC HISTORICAL: IMG1180011

## 2016-06-21 ENCOUNTER — Encounter (HOSPITAL_COMMUNITY): Payer: Self-pay | Admitting: Interventional Radiology

## 2016-06-21 ENCOUNTER — Other Ambulatory Visit: Payer: Self-pay | Admitting: Radiology

## 2016-06-22 ENCOUNTER — Other Ambulatory Visit: Payer: Self-pay | Admitting: Radiology

## 2016-06-23 ENCOUNTER — Ambulatory Visit (HOSPITAL_COMMUNITY)
Admission: RE | Admit: 2016-06-23 | Discharge: 2016-06-23 | Disposition: A | Discharge status: Home / Self Care | Payer: Medicare Other | Source: Ambulatory Visit | Attending: Interventional Radiology | Admitting: Interventional Radiology

## 2016-06-23 ENCOUNTER — Other Ambulatory Visit (HOSPITAL_COMMUNITY): Payer: Self-pay | Admitting: Interventional Radiology

## 2016-06-23 DIAGNOSIS — J45909 Unspecified asthma, uncomplicated: Secondary | ICD-10-CM | POA: Diagnosis not present

## 2016-06-23 DIAGNOSIS — S32020A Wedge compression fracture of second lumbar vertebra, initial encounter for closed fracture: Secondary | ICD-10-CM

## 2016-06-23 DIAGNOSIS — Z5181 Encounter for therapeutic drug level monitoring: Secondary | ICD-10-CM | POA: Insufficient documentation

## 2016-06-23 DIAGNOSIS — M545 Low back pain, unspecified: Secondary | ICD-10-CM

## 2016-06-23 DIAGNOSIS — Z79899 Other long term (current) drug therapy: Secondary | ICD-10-CM | POA: Insufficient documentation

## 2016-06-23 DIAGNOSIS — S32028A Other fracture of second lumbar vertebra, initial encounter for closed fracture: Secondary | ICD-10-CM | POA: Insufficient documentation

## 2016-06-23 DIAGNOSIS — K219 Gastro-esophageal reflux disease without esophagitis: Secondary | ICD-10-CM | POA: Diagnosis not present

## 2016-06-23 DIAGNOSIS — I10 Essential (primary) hypertension: Secondary | ICD-10-CM | POA: Insufficient documentation

## 2016-06-23 DIAGNOSIS — S22088A Other fracture of T11-T12 vertebra, initial encounter for closed fracture: Secondary | ICD-10-CM | POA: Insufficient documentation

## 2016-06-23 DIAGNOSIS — X58XXXA Exposure to other specified factors, initial encounter: Secondary | ICD-10-CM | POA: Insufficient documentation

## 2016-06-23 HISTORY — PX: IR GENERIC HISTORICAL: IMG1180011

## 2016-06-23 LAB — BASIC METABOLIC PANEL
Anion gap: 8 (ref 5–15)
BUN: 23 mg/dL — AB (ref 6–20)
CHLORIDE: 100 mmol/L — AB (ref 101–111)
CO2: 28 mmol/L (ref 22–32)
CREATININE: 0.75 mg/dL (ref 0.44–1.00)
Calcium: 11.8 mg/dL — ABNORMAL HIGH (ref 8.9–10.3)
GFR calc Af Amer: >60 mL/min (ref >60)
GLUCOSE: 108 mg/dL — AB (ref 65–99)
POTASSIUM: 4.7 mmol/L (ref 3.5–5.1)
Sodium: 136 mmol/L (ref 135–145)

## 2016-06-23 LAB — CBC WITH DIFFERENTIAL/PLATELET
Basophils Absolute: 0.1 10*3/uL (ref 0.0–0.1)
Basophils Relative: 1 %
EOS PCT: 1 %
Eosinophils Absolute: 0.1 10*3/uL (ref 0.0–0.7)
HCT: 34.5 % — ABNORMAL LOW (ref 36.0–46.0)
Hemoglobin: 11.3 g/dL — ABNORMAL LOW (ref 12.0–15.0)
LYMPHS ABS: 3.6 10*3/uL (ref 0.7–4.0)
LYMPHS PCT: 45 %
MCH: 28.8 pg (ref 26.0–34.0)
MCHC: 32.8 g/dL (ref 30.0–36.0)
MCV: 87.8 fL (ref 78.0–100.0)
MONO ABS: 1.1 10*3/uL — AB (ref 0.1–1.0)
Monocytes Relative: 13 %
Neutro Abs: 3.2 10*3/uL (ref 1.7–7.7)
Neutrophils Relative %: 40 %
PLATELETS: 299 10*3/uL (ref 150–400)
RBC: 3.93 MIL/uL (ref 3.87–5.11)
RDW: 14.7 % (ref 11.5–15.5)
WBC: 8 10*3/uL (ref 4.0–10.5)

## 2016-06-23 LAB — PROTIME-INR
INR: 0.99
Prothrombin Time: 13.1 seconds (ref 11.4–15.2)

## 2016-06-23 MED ORDER — TOBRAMYCIN SULFATE 1.2 G IJ SOLR
INTRAMUSCULAR | Status: AC
  Filled 2016-06-23: qty 1.2

## 2016-06-23 MED ORDER — SODIUM CHLORIDE 0.9 % IV SOLN
INTRAVENOUS | Status: DC
  Administered 2016-06-23: 10:00:00 via INTRAVENOUS

## 2016-06-23 MED ORDER — MIDAZOLAM HCL 2 MG/2ML IJ SOLN
INTRAMUSCULAR | Status: AC | PRN
  Administered 2016-06-23 (×2): 1 mg via INTRAVENOUS

## 2016-06-23 MED ORDER — TOBRAMYCIN SULFATE 1.2 G IJ SOLR
INTRAMUSCULAR | Status: AC | PRN
  Administered 2016-06-23: .1 g via TOPICAL

## 2016-06-23 MED ORDER — IOPAMIDOL (ISOVUE-300) INJECTION 61%
INTRAVENOUS | Status: AC
  Administered 2016-06-23: 10 mL
  Filled 2016-06-23: qty 50

## 2016-06-23 MED ORDER — FENTANYL CITRATE (PF) 100 MCG/2ML IJ SOLN
INTRAMUSCULAR | Status: AC | PRN
  Administered 2016-06-23 (×2): 25 ug via INTRAVENOUS

## 2016-06-23 MED ORDER — SODIUM CHLORIDE 0.9 % IV SOLN: INTRAVENOUS | Status: AC

## 2016-06-23 MED ORDER — VANCOMYCIN HCL IN DEXTROSE 1-5 GM/200ML-% IV SOLN
1000.0000 mg | INTRAVENOUS | Status: AC
  Administered 2016-06-23: 1000 mg via INTRAVENOUS

## 2016-06-23 MED ORDER — MIDAZOLAM HCL 2 MG/2ML IJ SOLN
INTRAMUSCULAR | Status: AC
  Filled 2016-06-23: qty 4

## 2016-06-23 MED ORDER — FENTANYL CITRATE (PF) 100 MCG/2ML IJ SOLN
INTRAMUSCULAR | Status: AC
  Filled 2016-06-23: qty 4

## 2016-06-23 MED ORDER — VANCOMYCIN HCL IN DEXTROSE 1-5 GM/200ML-% IV SOLN
INTRAVENOUS | Status: AC
  Filled 2016-06-23: qty 200

## 2016-06-23 MED ORDER — BUPIVACAINE HCL (PF) 0.25 % IJ SOLN
INTRAMUSCULAR | Status: AC | PRN
  Administered 2016-06-23: 30 mL

## 2016-06-23 MED ORDER — BUPIVACAINE HCL (PF) 0.25 % IJ SOLN
INTRAMUSCULAR | Status: AC
  Filled 2016-06-23: qty 30

## 2016-06-23 MED ORDER — GELATIN ABSORBABLE 12-7 MM EX MISC
CUTANEOUS | Status: AC
  Filled 2016-06-23: qty 1

## 2016-06-23 MED ORDER — TOBRAMYCIN SULFATE 1.2 G IJ SOLR
INTRAMUSCULAR | Status: AC
Start: 2016-06-23 — End: 2016-06-23
  Filled 2016-06-23: qty 1.2

## 2016-06-23 MED ORDER — HYDROMORPHONE HCL 1 MG/ML IJ SOLN
INTRAMUSCULAR | Status: AC
  Filled 2016-06-23: qty 1

## 2016-06-23 NOTE — H&P (Signed)
Chief Complaint: Back pain  Supervising Physician: Julieanne Cottoneveshwar, Sanjeev  [facility]  History of Present Illness: Tara Frederick is a 82 y.o. female who is here today for vertebral augmentation of T12 and L2.  She has previously had L1 kyphoplasty by Dr. Corliss Skainseveshwar, this was done 03/21/2016.  She did well for a couple of weeks until she developed worsening  back pain again.  She  has fractures in T8, T10, T12 and L1  She was scheduled to have kyphoplasty on 05/05/16 however the procedure was postponed due to a non-healing wound on her lower back.  This has since healed. She has no recent illness or signs of infection.  She is NPO. She does not take blood thinners.  Past Medical History:  Diagnosis Date  . Asthma   . Benign essential HTN 03/04/2016  . Benign essential HTN   . GERD (gastroesophageal reflux disease)   . Glaucoma   . HTN (hypertension)     Past Surgical History:  Procedure Laterality Date  . APPENDECTOMY    . HEMORRHOID SURGERY     x2  . IR GENERIC HISTORICAL  03/14/2016   IR VERTEBROPLASTY LUMBAR BX INC UNI/BIL INC/INJECT/IMAGING 03/14/2016 Julieanne CottonSanjeev Deveshwar, MD [facility]-INTERV RAD  . IR GENERIC HISTORICAL  06/20/2016   IR RADIOLOGIST EVAL & MGMT 06/20/2016 [facility]INTER[facility]  . TOTAL ABDOMINAL HYSTERECTOMY W/ BILATERAL SALPINGOOPHORECTOMY      Allergies: Toprol xl [metoprolol]; Actonel [risedronate sodium]; Biaxin [clarithromycin]; Ceftin [cefuroxime]; Codeine; Diuretic [buchu-cornsilk-ch grass-hydran]; Evista [raloxifene]; Fosamax [alendronate sodium]; Lortab [hydrocodone-acetaminophen]; Macrobid [nitrofurantoin]; Morphine and related; Neosporin [neomycin-bacitracin zn-polymyx]; Penicillins; Prednisone; Tetracyclines & related; and Tramadol  Medications: Prior to Admission medications   Medication Sig Start Date End Date Taking? Authorizing Provider  acetaminophen (TYLENOL) 325 MG tablet Take 650 mg by mouth every 4 (four) hours as needed for mild  pain.   Yes Historical Provider, MD  amLODipine (NORVASC) 5 MG tablet Take 5 mg by mouth daily.   Yes Historical Provider, MD  carboxymethylcellulose (REFRESH PLUS) 0.5 % SOLN Place 1 drop into both eyes 3 (three) times daily as needed.   Yes Historical Provider, MD  ergocalciferol (VITAMIN D2) 50000 units capsule Take 50,000 Units by mouth every 30 (thirty) days.   Yes Historical Provider, MD  erythromycin ophthalmic ointment Place 1 application into both eyes at bedtime.   Yes Historical Provider, MD  fexofenadine (ALLEGRA) 180 MG tablet Take 180 mg by mouth daily.   Yes Historical Provider, MD  latanoprost (XALATAN) 0.005 % ophthalmic solution Place 1 drop into both eyes at bedtime.    Yes Historical Provider, MD  Multiple Vitamins-Minerals (MULTIVITAMIN WITH MINERALS) tablet Take 1 tablet by mouth daily.   Yes Historical Provider, MD  Nutritional Supplements (COMPLETE PROTEIN/VITAMIN SHAKE PO) Take 1 Container by mouth daily with supper.   Yes Historical Provider, MD  pantoprazole (PROTONIX) 40 MG tablet Take 1 tablet (40 mg total) by mouth daily. 03/16/16  Yes Zannie CovePreetha Joseph, MD  polyethylene glycol (MIRALAX / GLYCOLAX) packet Take 17 g by mouth 3 (three) times daily. Patient taking differently: Take 17 g by mouth daily.  03/16/16  Yes Zannie CovePreetha Joseph, MD  vitamin C (ASCORBIC ACID) 500 MG tablet Take 500 mg by mouth daily.   Yes Historical Provider, MD  zinc gluconate 50 MG tablet Take 50 mg by mouth daily.   Yes Historical Provider, MD  albuterol (PROVENTIL HFA;VENTOLIN HFA) 108 (90 Base) MCG/ACT inhaler Inhale 1 puff into the lungs every 6 (six) hours as  needed for wheezing or shortness of breath.    Historical Provider, MD     Family History  Problem Relation Age of Onset  . Cancer Sister     Unknown type  . Stomach cancer Brother   . Leukemia Brother     Social History   Social History  . Marital status: Single    Spouse name: N/A  . Number of children: N/A  . Years of education:  N/A   Occupational History  . retired    Social History Main Topics  . Smoking status: Never Smoker  . Smokeless tobacco: Never Used  . Alcohol use No  . Drug use: No  . Sexual activity: Not on file   Other Topics Concern  . Not on file   Social History Narrative  . No narrative on file    Review of Systems: A 12 point ROS discussed  Review of Systems  Constitutional: Positive for activity change. Negative for appetite change, chills, fatigue and fever.  HENT: Negative.   Respiratory: Negative.   Cardiovascular: Negative.   Gastrointestinal: Negative.   Genitourinary: Negative.   Musculoskeletal: Positive for back pain and gait problem.  Skin: Negative.   Hematological: Negative.   Psychiatric/Behavioral: Negative.     Vital Signs: BP 131/65   Pulse 79   Temp 97.8 F (36.6 C)   Resp 18   Ht 5\' 1"  (1.549 m)   Wt 115 lb (52.2 kg)   SpO2 98%   BMI 21.73 kg/m   Physical Exam  Constitutional: She is oriented to person, place, and time.  Thin, Elderly, Frail, NAD  HENT:  Head: Normocephalic and atraumatic.  Eyes: EOM are normal.  Neck: Normal range of motion.  Cardiovascular: Normal rate, regular rhythm and normal heart sounds.   No murmur heard. Pulmonary/Chest: Effort normal and breath sounds normal. She has no wheezes.  Abdominal: Soft. She exhibits no distension. There is no tenderness.  Musculoskeletal:       Back:  Neurological: She is alert and oriented to person, place, and time.  Skin: Skin is warm and dry.  Psychiatric: She has a normal mood and affect. Her behavior is normal. Judgment and thought content normal.  Vitals reviewed.   Mallampati Score:  MD Evaluation Airway: WNL Heart: WNL Abdomen: WNL Chest/ Lungs: WNL ASA  Classification: 3 Mallampati/Airway Score: One  Imaging: Ir Radiologist Eval & Mgmt  Result Date: 06/21/2016 EXAM: ESTABLISHED PATIENT OFFICE VISIT CHIEF COMPLAINT: Continued low back pain in the thoracolumbar  region. Current Pain Level: 1-10 HISTORY OF PRESENT ILLNESS: The patient is a 82 year old right-handed lady who presents today with continued low back pain in the thoracolumbar region on a scale of 8/10. The patient was seen on 05/05/2016, when she was scheduled for treatment of her thoracolumbar lumbar vertebral body fractures at T8, T10 T12 and L2 as per the MRI scans performed of the thoracic and lumbosacral spine of 04/26/2016. However, the procedure could not be performed in view of the fact that the patient had an infected wound in her low back sacral region which was being treated, and also the fact that the patient had a UTI. The procedure was then postponed. Since that time, the patient has treatment for her wound which is now completely healed as per the daughter. The patient also denies any symptoms of a UTI such as dysuria, hematuria or polyuria. The patient denies any recent chills, fever or rigors. She denies any radiation of pain into the lower extremities in  a radicular manner. The patient lives by herself but has support through care workers every day and also from the family. She ambulates with a rolling walker. She reports stooping, bending or lifting weights. She claims her appetite is reasonable having gained at least 5 pounds since the time of the fractures. Patient denies any autonomic dysfunction of her bowel or bladder activity. According to the daughter she is a fairly active person. Past medical history, social history, surgical history and family history as per previously unchanged. Medications: The patient presently takes amlodipine, Centrum Silver, ergocalciferol, erythromycin ophthalmic solution, fexofenadine, fluticasone, latanoprost ophthalmic solution to the left eye, MiraLax, Protonix, Refresh dry eye therapy, Ventolin, zinc sulfate, Prolia, Flonase, vitamin-C. Allergies: The patient claims to be allergic to prednisone, Lortab, Toprol, morphine, Macrobid, Evista, diuretic,  tetracycline, Fosamax, Neosporin, Actonel, penicillin, Biaxin, tramadol, Ceftin. All these apparently result in nausea, vomiting and also rashes. She denies any symptoms of cardiovascular collapse or breathing difficulties or angioedema. Review of systems is otherwise as above. PHYSICAL EXAMINATION: Appears to be in moderate distress on account of the pain in her thoracolumbar region. Neurologically remains alert, awake, oriented to time, place, space. No lateralizing cranial nerve abnormalities or motor, sensory or coordination difficulties. The patient is exquisitely tender in the lower thoracic T12 and L2 regions. Only mild tenderness reported at the T10 and T8 levels. ASSESSMENT AND PLAN: Given the patient's clinical history and above MRI findings it was decided to proceed with treatment of the T12 and L2 levels. Vertebral body augmentation procedure will be undertaken at these two levels first. Questions were answered to their satisfaction. The procedure had been explained to the patient and the family previously. In the meantime, the patient has been asked to continue avoiding stooping, bending or lifting anything above 10 pounds and to continue using her rolling walker all the time. The procedure will be scheduled as soon as possible. Electronically Signed   By: Julieanne CottonSanjeev  Deveshwar M.D.   On: 06/20/2016 15:49    Labs:  CBC:  Recent Labs  03/13/16 0534 03/14/16 0658 05/05/16 0815 06/23/16 0951  WBC 11.1* 8.8 11.2* 8.0  HGB 10.9* 10.5* 10.7* 11.3*  HCT 32.5* 32.0* 33.9* 34.5*  PLT 400 409* 412* 299    COAGS:  Recent Labs  03/12/16 0627 05/05/16 0815  INR 1.16 1.05  APTT 41* 30    BMP:  Recent Labs  03/11/16 0521 03/12/16 0627 03/13/16 0534 05/05/16 0815  NA 132* 130* 133* 135  K 2.8* 2.8* 4.8 4.3  CL 89* 88* 97* 99*  CO2 30 30 28 28   GLUCOSE 91 101* 95 108*  BUN 8 21* 19 24*  CALCIUM 10.5* 10.2 10.4* 12.0*  CREATININE 0.72 1.22* 0.98 0.84  GFRNONAA >60 38* 50* 59*    GFRAA >60 44* 58* >60    LIVER FUNCTION TESTS: No results for input(s): BILITOT, AST, ALT, ALKPHOS, PROT, ALBUMIN in the last 8760 hours.  TUMOR MARKERS: No results for input(s): AFPTM, CEA, CA199, CHROMGRNA in the last 8760 hours.  Assessment and Plan:  Compression fractures T8, 10, 12, and L2  Will proceed with vertebral augmentation of T12 and L2 today by Dr. Corliss Skainseveshwar  Risks and Benefits discussed with the patient including, but not limited to education regarding the natural healing process of compression fractures without intervention, bleeding, infection, cement migration which may cause spinal cord damage, paralysis, pulmonary embolism or even death.  All of the patient's questions were answered, patient is agreeable to proceed. Consent signed and in  chart.  Electronically Signed: Gwynneth Macleod PA-C 06/23/2016, 10:24 AM   I spent a total of  25 Minutes in face to face in clinical consultation, greater than 50% of which was counseling/coordinating care for kyphoplasty.

## 2016-06-23 NOTE — Procedures (Signed)
S/P T 12 VP and L2 VP 

## 2016-06-23 NOTE — Procedures (Signed)
No note

## 2016-06-23 NOTE — Discharge Instructions (Signed)
1.No stooping ,bending or lifting more than 10 lbs for 2 weeks. °2.Use Tommy Minichiello to ambulate  for 2 weeks. °3.RTC PRN in 2 weeks ° °KYPHOPLASTY/VERTEBROPLASTY DISCHARGE INSTRUCTIONS ° °Medications: (check all that apply) ° °   Resume all home medications as before procedure. °                 °  °Continue your pain medications as prescribed as needed.  Over the next 3-5 days, decrease your pain medication as tolerated.  Over the counter medications (i.e. Tylenol, ibuprofen, and aleve) may be substituted once severe/moderate pain symptoms have subsided. ° ° Wound Care: °- Bandages may be removed the day following your procedure.  You may get your incision wet once bandages are removed.  Bandaids may be used to cover the incisions until scab formation.  Topical ointments are optional. ° °- If you develop a fever greater than 101 degrees, have increased skin redness at the incision sites or pus-like oozing from incisions occurring within 1 week of the procedure, contact radiology at [phone] or [phone]. ° °- Ice pack to back for 15-20 minutes 2-3 time per day for first 2-3 days post procedure.  The ice will expedite muscle healing and help with the pain from the incisions. ° ° Activity: °- Bedrest today with limited activity for 24 hours post procedure. ° °- No driving for 48 hours. ° °- Increase your activity as tolerated after bedrest (with assistance if necessary). ° °- Refrain from any strenuous activity or heavy lifting (greater than 10 lbs.). ° ° Follow up: °- Contact radiology at [phone] or [phone] if any questions/concerns. ° °- A physician assistant from radiology will contact you in approximately 1 week. ° °- If a biopsy was performed at the time of your procedure, your referring physician should receive the results in usually 2-3 days. ° ° ° ° ° ° ° ° °

## 2016-06-26 ENCOUNTER — Encounter (HOSPITAL_COMMUNITY): Payer: Self-pay | Admitting: Interventional Radiology

## 2016-06-27 ENCOUNTER — Ambulatory Visit (HOSPITAL_COMMUNITY): Admission: RE | Admit: 2016-06-27 | Payer: Medicare Other | Source: Ambulatory Visit

## 2016-07-04 ENCOUNTER — Telehealth (HOSPITAL_COMMUNITY): Payer: Self-pay

## 2016-07-04 NOTE — Telephone Encounter (Signed)
Pt stated that she was feeling better but she was having some pain near her butt. She goes to see her primary care doctor on Monday and also goes to have a bone density test done on 12/14. She will call us after the bone density test to schedule f/u if she feels that she needs to come in. AW

## 2016-07-13 ENCOUNTER — Other Ambulatory Visit (HOSPITAL_COMMUNITY): Payer: Self-pay | Admitting: Interventional Radiology

## 2016-07-13 DIAGNOSIS — M4850XS Collapsed vertebra, not elsewhere classified, site unspecified, sequela of fracture: Principal | ICD-10-CM

## 2016-07-13 DIAGNOSIS — IMO0001 Reserved for inherently not codable concepts without codable children: Secondary | ICD-10-CM

## 2016-07-25 ENCOUNTER — Ambulatory Visit (HOSPITAL_COMMUNITY): Admission: RE | Admit: 2016-07-25 | Payer: Medicare Other | Source: Ambulatory Visit

## 2017-01-11 ENCOUNTER — Telehealth (HOSPITAL_COMMUNITY): Payer: Self-pay

## 2017-01-11 NOTE — Telephone Encounter (Signed)
Called to schedule mri, left message for pt to return call. AW 

## 2017-01-15 ENCOUNTER — Other Ambulatory Visit (HOSPITAL_COMMUNITY): Payer: Self-pay | Admitting: Interventional Radiology

## 2017-01-15 DIAGNOSIS — IMO0001 Reserved for inherently not codable concepts without codable children: Secondary | ICD-10-CM

## 2017-01-15 DIAGNOSIS — M4850XA Collapsed vertebra, not elsewhere classified, site unspecified, initial encounter for fracture: Principal | ICD-10-CM

## 2017-01-18 ENCOUNTER — Ambulatory Visit (HOSPITAL_COMMUNITY)
Admission: RE | Admit: 2017-01-18 | Discharge: 2017-01-18 | Disposition: A | Discharge status: Home / Self Care | Payer: Medicare Other | Source: Ambulatory Visit | Attending: Interventional Radiology | Admitting: Interventional Radiology

## 2017-01-18 DIAGNOSIS — M5136 Other intervertebral disc degeneration, lumbar region: Secondary | ICD-10-CM | POA: Diagnosis not present

## 2017-01-18 DIAGNOSIS — M4804 Spinal stenosis, thoracic region: Secondary | ICD-10-CM | POA: Diagnosis not present

## 2017-01-18 DIAGNOSIS — M4856XA Collapsed vertebra, not elsewhere classified, lumbar region, initial encounter for fracture: Secondary | ICD-10-CM | POA: Insufficient documentation

## 2017-01-18 DIAGNOSIS — M4850XA Collapsed vertebra, not elsewhere classified, site unspecified, initial encounter for fracture: Secondary | ICD-10-CM | POA: Diagnosis present

## 2017-01-18 DIAGNOSIS — M48061 Spinal stenosis, lumbar region without neurogenic claudication: Secondary | ICD-10-CM | POA: Insufficient documentation

## 2017-01-18 DIAGNOSIS — M4854XA Collapsed vertebra, not elsewhere classified, thoracic region, initial encounter for fracture: Secondary | ICD-10-CM | POA: Insufficient documentation

## 2017-01-18 DIAGNOSIS — IMO0001 Reserved for inherently not codable concepts without codable children: Secondary | ICD-10-CM

## 2017-01-18 DIAGNOSIS — X58XXXA Exposure to other specified factors, initial encounter: Secondary | ICD-10-CM | POA: Diagnosis not present

## 2017-01-29 ENCOUNTER — Other Ambulatory Visit (HOSPITAL_COMMUNITY): Payer: Self-pay | Admitting: Interventional Radiology

## 2017-01-29 DIAGNOSIS — M4850XA Collapsed vertebra, not elsewhere classified, site unspecified, initial encounter for fracture: Principal | ICD-10-CM

## 2017-01-29 DIAGNOSIS — IMO0001 Reserved for inherently not codable concepts without codable children: Secondary | ICD-10-CM

## 2017-02-04 IMAGING — XA IR VERTEBROPLASTY LUMBOSACRAL INJ
1 series · 13 of 15 positions shown · IV contrast (IODINE)
Comparison: none

INDICATION: Severe low back pain secondary to compression fractures at T12 and
L2.
TECHNIQUE: Informed written consent was obtained from the patient after a
thorough discussion of the procedural risks, benefits and
alternatives. All questions were addressed. Maximal Sterile Barrier
Technique was utilized including caps, mask, sterile gowns, sterile
gloves, sterile drape, hand hygiene and skin antiseptic. A timeout
was performed prior to the initiation of the procedure.

[Series 300: dr. (person_name) · 13 of 15 slices shown]
[im 1/15]
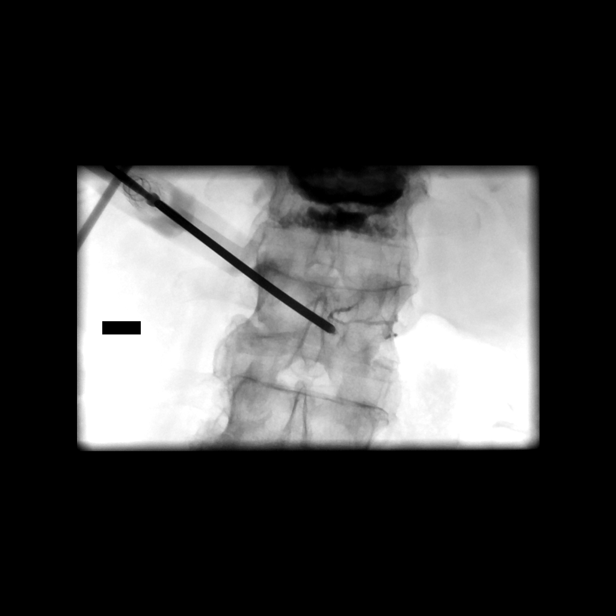
[im 2/15]
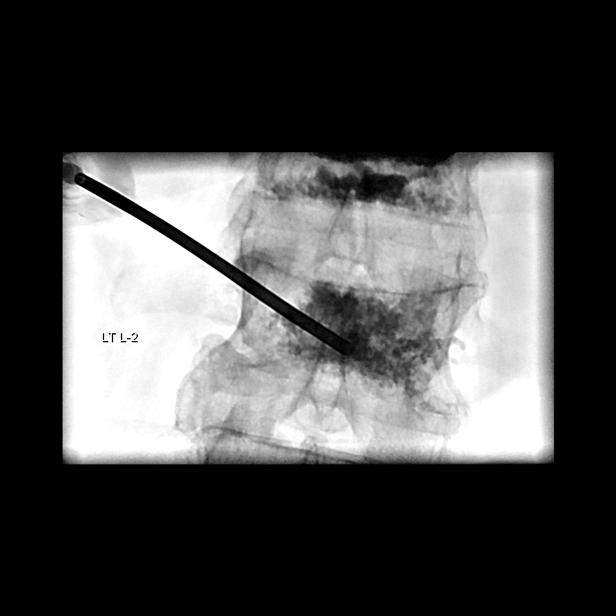
[im 3/15]
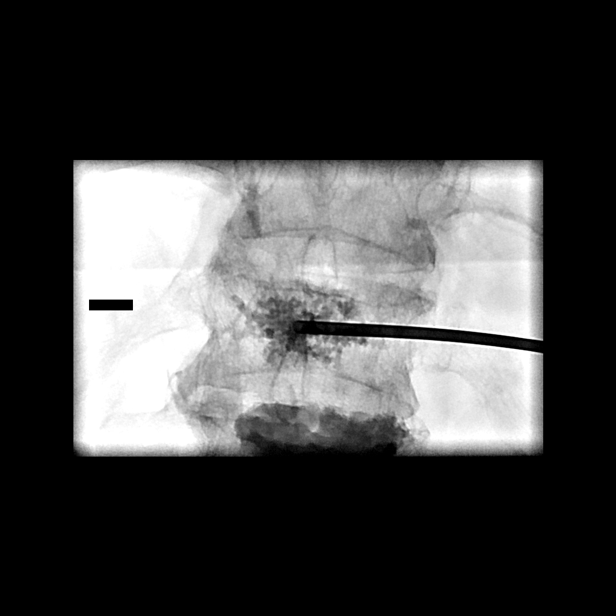
[im 5/15]
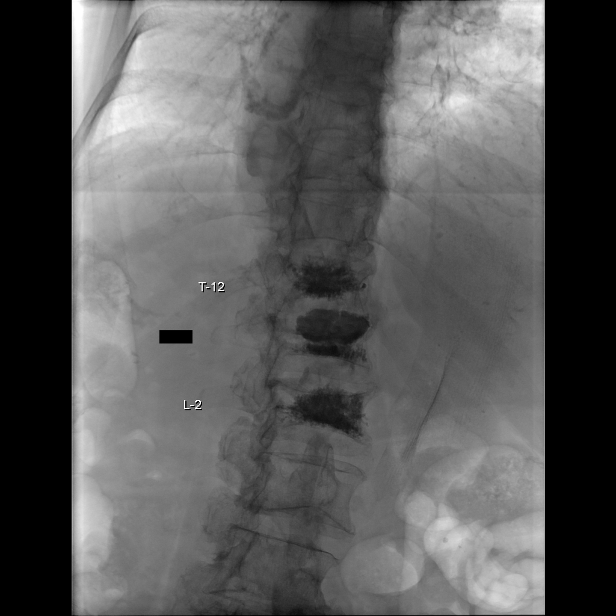
[im 6/15]
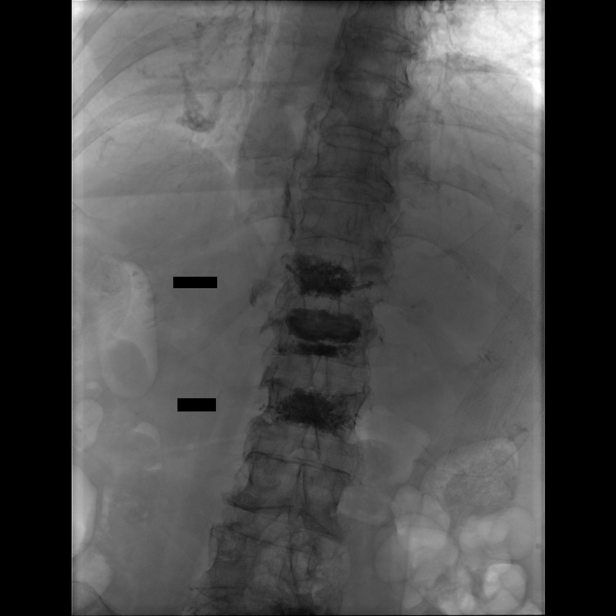
[im 7/15]
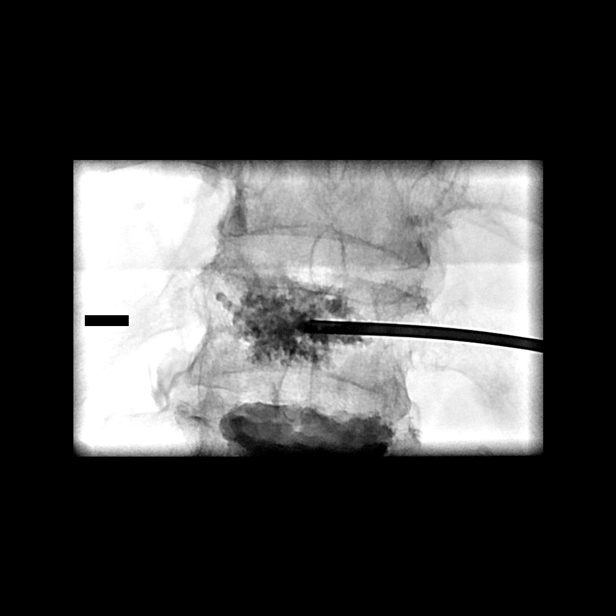
[im 8/15]
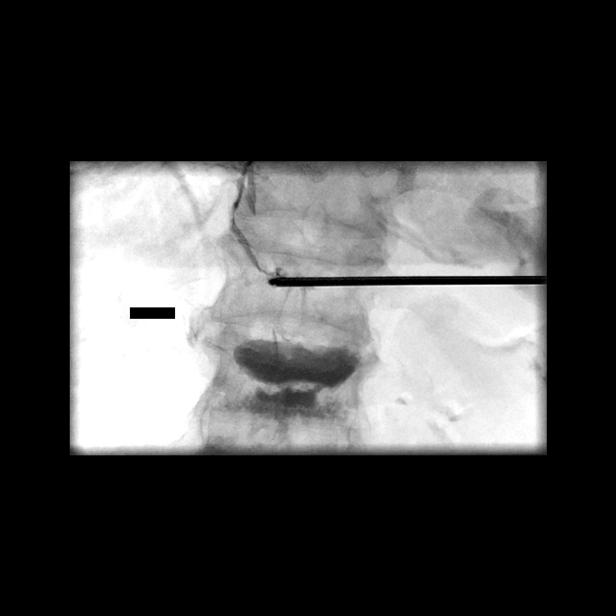
[im 9/15]
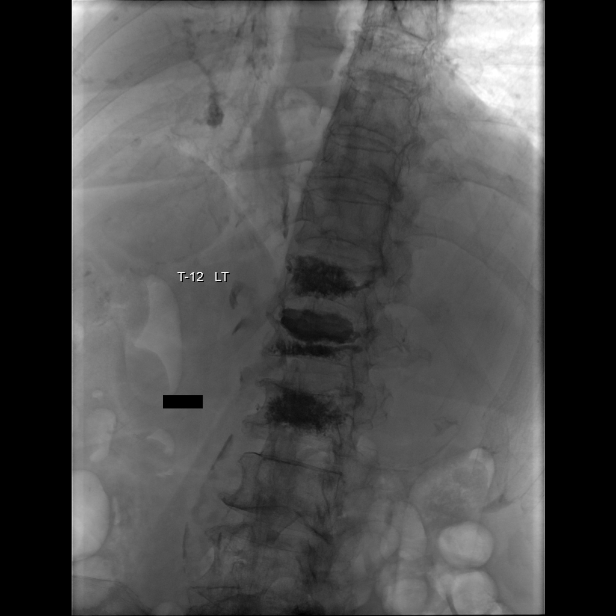
[im 10/15]
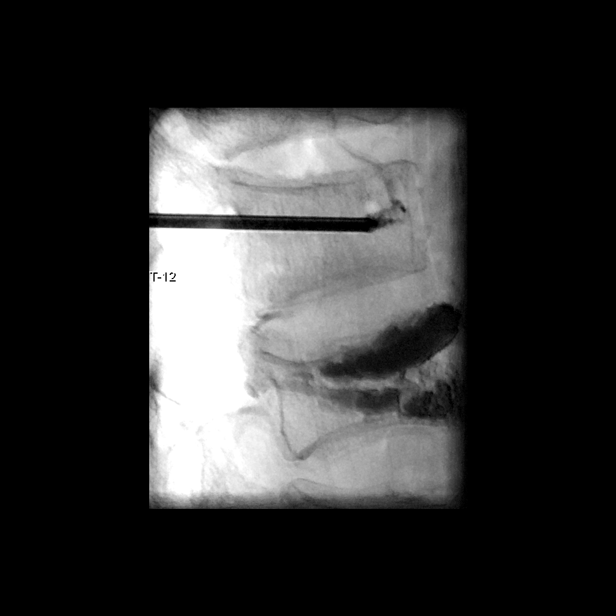
[im 11/15]
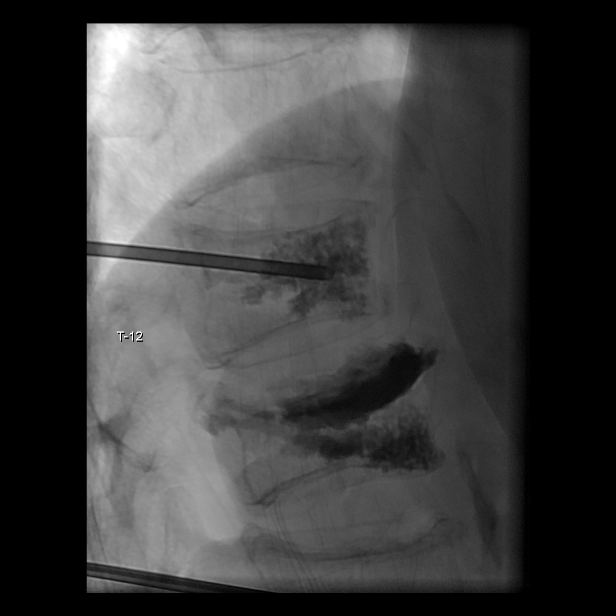
[im 13/15]
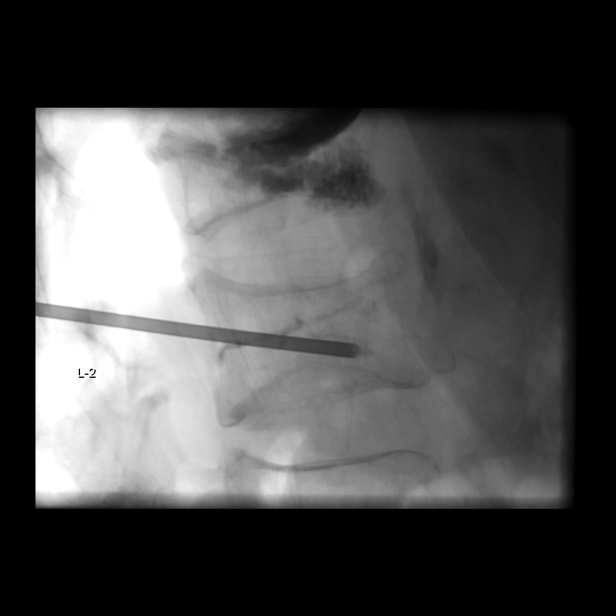
[im 14/15]
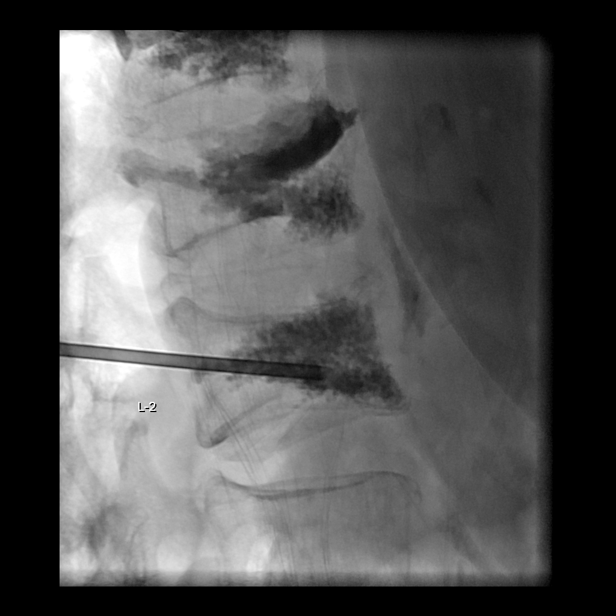
[im 15/15]
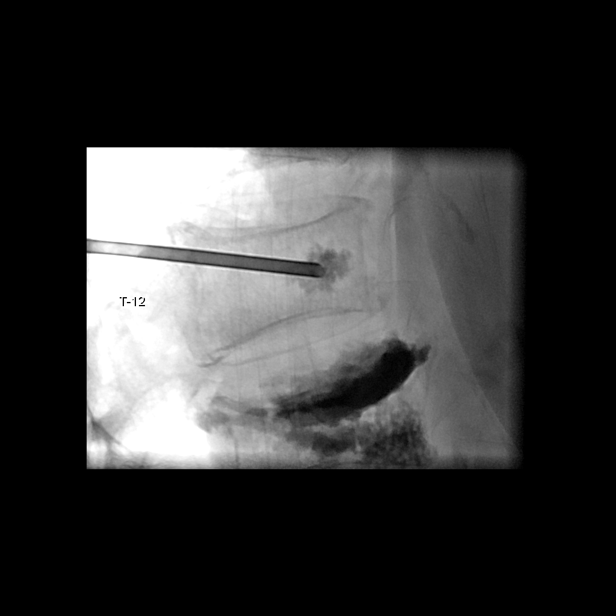

[13 of 15 positions shown; findings below may reference images not displayed]

EXAM:
IR VERTEBROPLASTY LUMBOSACRAL INJ; IR VERTEBROPLASTY ADDL
INJECTION<Procedure Description>IR VERTEBROPLASTY LUMBOSACRAL INJ;
IR VERTEBROPLASTY ADDL INJECTION

MEDICATIONS:
As antibiotic prophylaxis, vancomycin 1 g was ordered pre-procedure
and administered intravenously within 1 hour of incision.

ANESTHESIA/SEDATION:
Moderate (conscious) sedation was employed during this procedure. A
total of Versed mg and Fentanyl mcg was administered intravenously.

Moderate Sedation Time: 27 minutes. The patient's level of
consciousness and vital signs were monitored continuously by
radiology nursing throughout the procedure under my direct
supervision.

FLUOROSCOPY TIME:  Fluoroscopy Time: 11 minutes 20 seconds (869.5
mGy)

COMPLICATIONS:
None immediate.
PROCEDURE:
The patient was placed prone on the fluoroscopic table. Nasal oxygen
was administered. Physiologic monitoring was performed throughout
the duration of the procedure. The skin overlying the thoracolumbar
region was prepped and draped in the usual sterile fashion. The T12
and L2 vertebral bodies were identified and the right pedicle at T12
and the left pedicle at L2 were infiltrated with 0.25% Bupivacaine.
This was then followed by the advancement of a 13-gauge Cook needle
through the pedicles into the anterior one-third at T12 and at L2. A
gentle contrast injection demonstrated a trabecular pattern of
contrast with early opacification of anterior paraspinous veins.
This necessitated the use of Gel-Foam pledgets into the 13 gauge
Ronlor spinal needles prior to the delivery of methylmethacrylate
mixture.

At this time, methylmethacrylate mixture was reconstituted in the
Fizza delivery device system. Under biplane intermittent
fluoroscopy, the methylmethacrylate was then injected into the T12
and the L2 vertebral bodies with filling of the vertebral bodies.

No extravasation was noted into the disk spaces or posteriorly into
the spinal canal. No epidural venous contamination was seen.

The needle was then removed. Hemostasis was achieved at the skin
entry site.

There were no acute complications. Patient tolerated the procedure
well. The patient was observed for 3 hours and discharged in good
condition.
IMPRESSION: 1. Status post vertebral body augmentation for painful compression
fracture at T12 and L2 using vertebroplasty technique.

## 2017-02-06 ENCOUNTER — Ambulatory Visit (HOSPITAL_COMMUNITY)
Admission: RE | Admit: 2017-02-06 | Discharge: 2017-02-06 | Disposition: A | Discharge status: Home / Self Care | Payer: Medicare Other | Source: Ambulatory Visit | Attending: Interventional Radiology | Admitting: Interventional Radiology

## 2017-02-06 ENCOUNTER — Other Ambulatory Visit (HOSPITAL_COMMUNITY): Payer: Self-pay | Admitting: Interventional Radiology

## 2017-02-06 DIAGNOSIS — M4850XA Collapsed vertebra, not elsewhere classified, site unspecified, initial encounter for fracture: Principal | ICD-10-CM

## 2017-02-06 DIAGNOSIS — IMO0001 Reserved for inherently not codable concepts without codable children: Secondary | ICD-10-CM

## 2017-02-06 DIAGNOSIS — M549 Dorsalgia, unspecified: Secondary | ICD-10-CM

## 2017-02-06 HISTORY — PX: IR RADIOLOGIST EVAL & MGMT: IMG5224

## 2017-02-08 ENCOUNTER — Encounter (HOSPITAL_COMMUNITY): Payer: Self-pay | Admitting: Interventional Radiology

## 2017-02-09 ENCOUNTER — Encounter (HOSPITAL_COMMUNITY): Payer: Medicare Other

## 2017-02-09 ENCOUNTER — Ambulatory Visit (HOSPITAL_COMMUNITY)
Admission: RE | Admit: 2017-02-09 | Discharge: 2017-02-09 | Disposition: A | Discharge status: Home / Self Care | Payer: Medicare Other | Source: Ambulatory Visit | Attending: Interventional Radiology | Admitting: Interventional Radiology

## 2017-02-09 DIAGNOSIS — R937 Abnormal findings on diagnostic imaging of other parts of musculoskeletal system: Secondary | ICD-10-CM | POA: Insufficient documentation

## 2017-02-09 DIAGNOSIS — M549 Dorsalgia, unspecified: Secondary | ICD-10-CM | POA: Diagnosis not present

## 2017-02-09 MED ORDER — TECHNETIUM TC 99M MEDRONATE IV KIT: 25.0000 | PACK | Freq: Once | INTRAVENOUS | Status: DC | PRN

## 2017-02-15 ENCOUNTER — Telehealth (HOSPITAL_COMMUNITY): Payer: Self-pay

## 2017-02-15 NOTE — Telephone Encounter (Signed)
Pt agreed to f/u with PCP for pain management since she did not have any new fractures. AW

## 2017-09-01 IMAGING — MR MR THORACIC SPINE W/O CM
3 of 6 series · 9 of 48 positions shown · non-contrast
Comparison: MRI thoracic and lumbar spine June 26, 2016

CLINICAL DATA: Low back pain for 4 months. Status post T12 and L2
vertebroplasty.

EXAM:
MRI THORACIC AND LUMBAR SPINE WITHOUT CONTRAST
TECHNIQUE: Multiplanar and multiecho pulse sequences of the thoracic and lumbar
spine, were obtained without intravenous contrast.

[Series 2: T1 · sagittal · 4.0mm · 0.52mm/px · 3 of 13 slices shown]
[im 1/13]
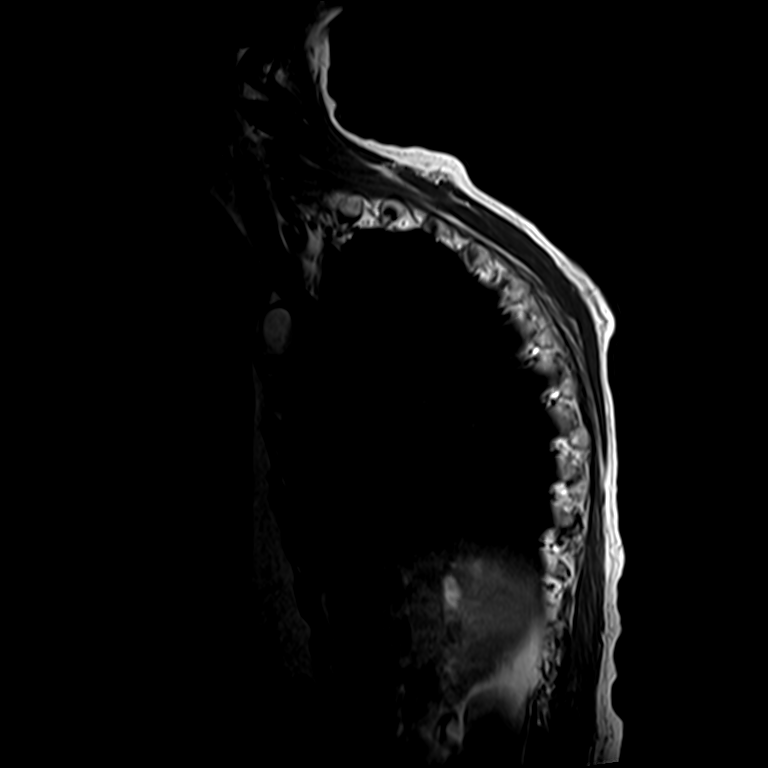
[im 7/13]
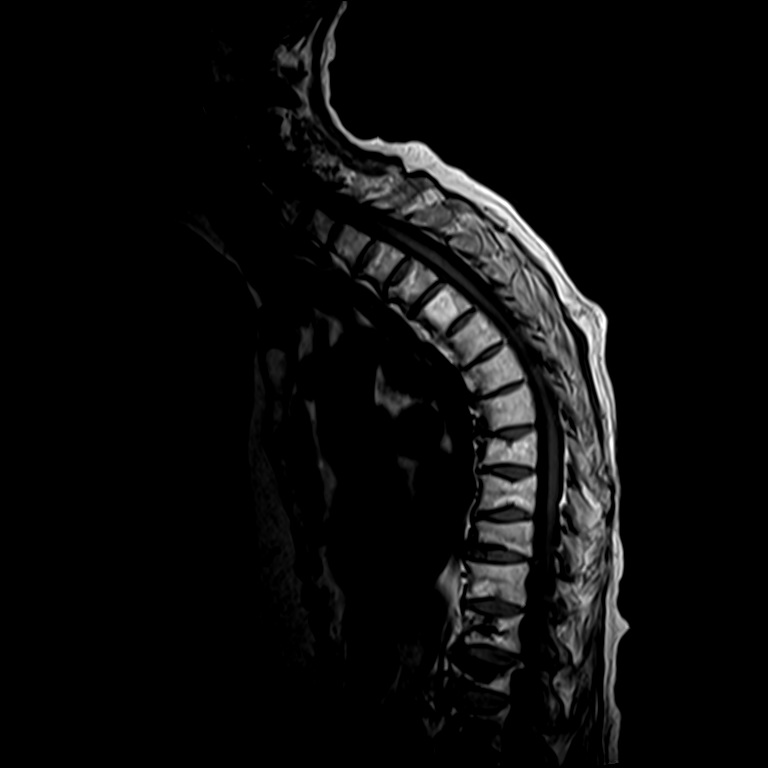
[im 13/13]
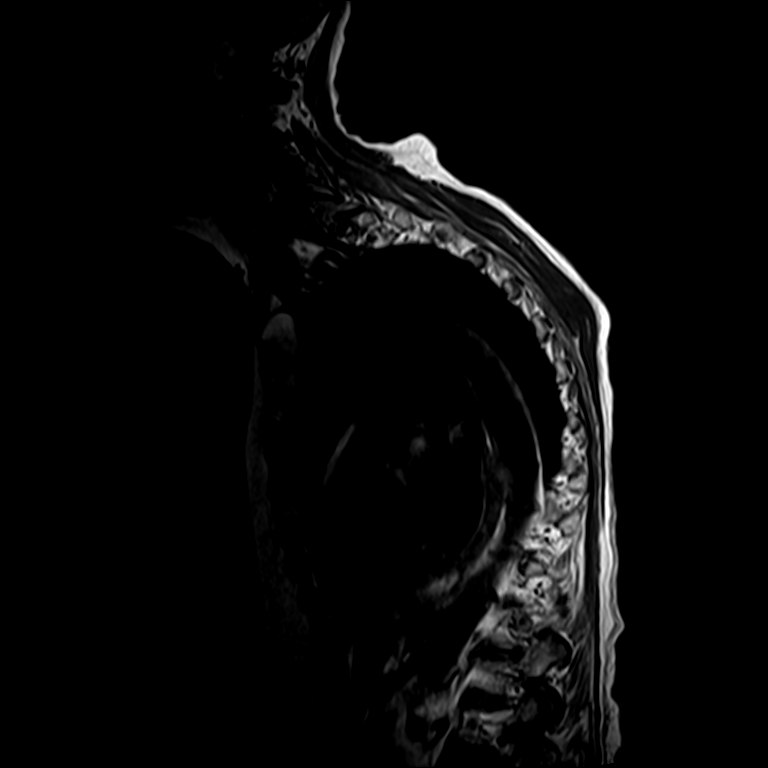

[Series 3: T2 · sagittal · 4.0mm · 0.38mm/px · 3 of 13 slices shown (1 of 2)]
[im 1/13]
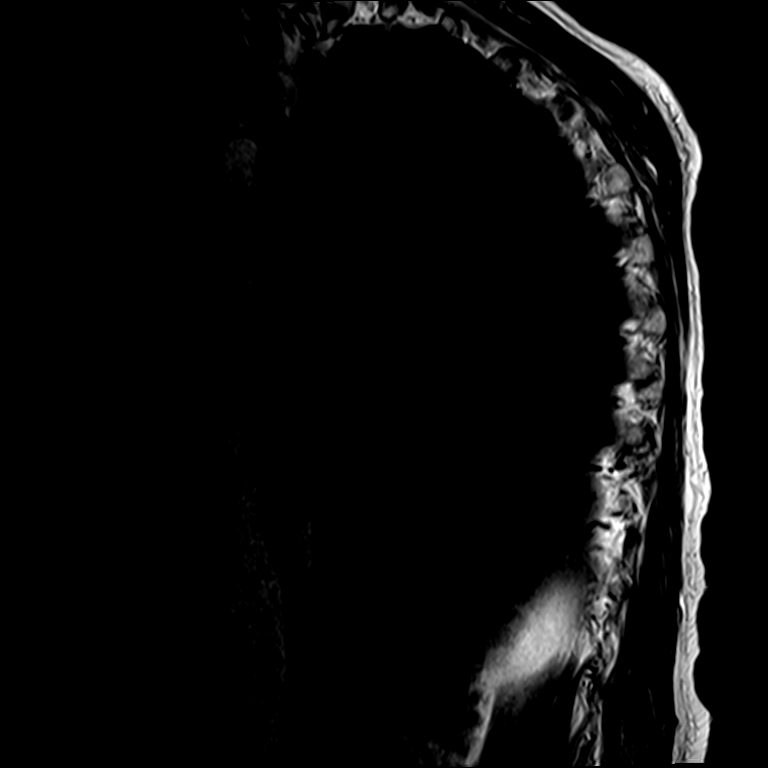
[im 9/13]
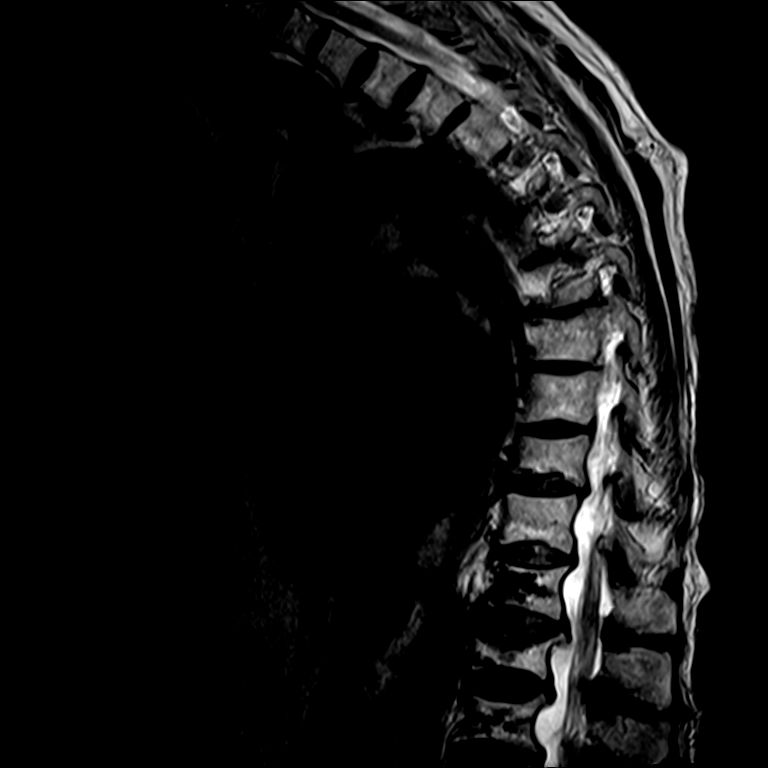
[im 13/13]
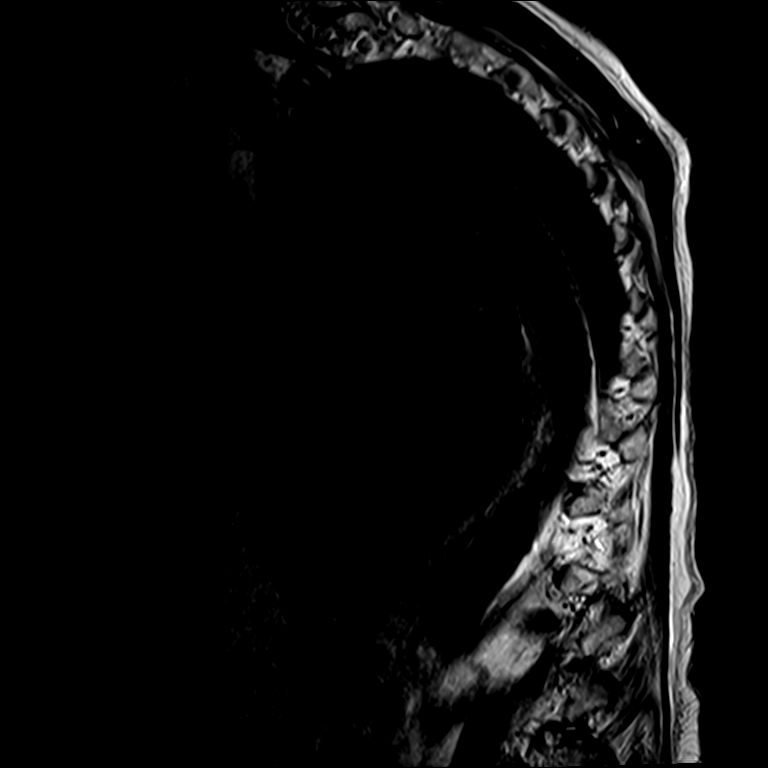

[Series 7: T2 · axial · 3.0mm · 0.21mm/px · z∈[-211,-53]mm · 3 of 47 slices shown (2 of 2)]
[im 7/47]
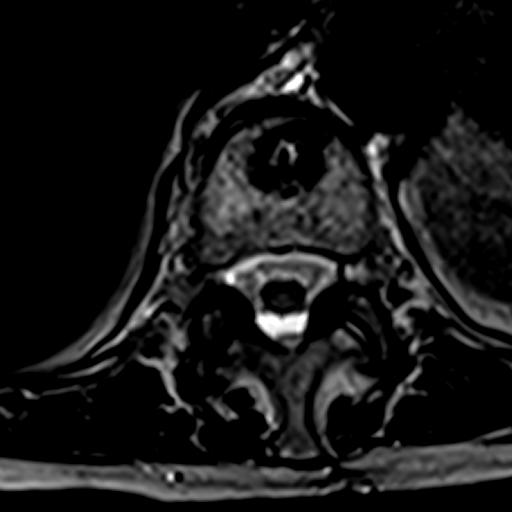
[im 24/47]
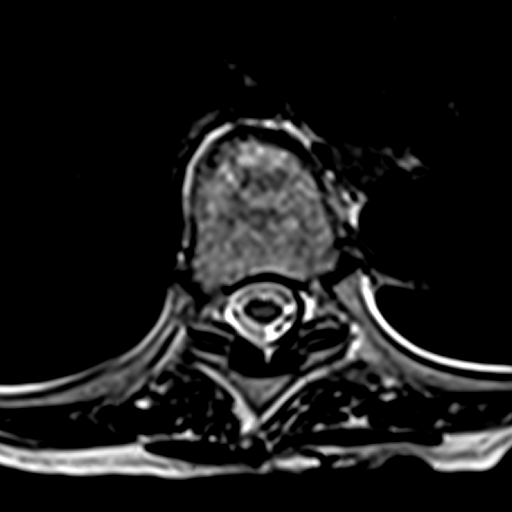
[im 40/47]
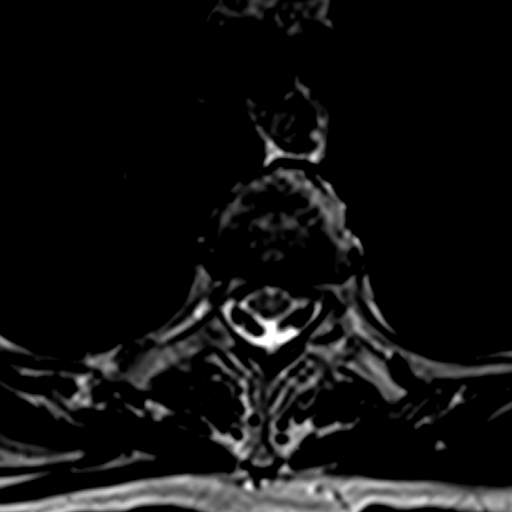

[9 of 48 positions shown; findings below may reference images not displayed]

FINDINGS: MRI THORACIC SPINE FINDINGS

ALIGNMENT: Exaggerated thoracic kyphosis. No malalignment.

VERTEBRAE/DISCS: Old mild T2, mild T3, mild T8, mild T9, moderate
T10, mild T11, moderate T12 compression fractures, status post T12
cement augmentation. Slightly progressed height loss at T10, the
remaining compression fractures are relatively stable. Scattered
hemangiomata. Mild disc desiccation all thoracic levels with mild
chronic discogenic endplate changes. No suspicious bone marrow
signal. Generalized bright T1 bone marrow signal compatible with
osteopenia.

CORD: Thoracic spinal cord is normal morphology and signal
characteristics.

PREVERTEBRAL AND PARASPINAL SOFT TISSUES:  Nonacute.

DISC LEVELS:

T1-2 through T9-10: Multilevel annular bulging without canal
stenosis or neural foraminal narrowing.

T10-11: Small broad-based disc bulge, mild facet arthropathy. No
canal stenosis. Moderate neural foraminal narrowing.

T11-12: Mild end plate spurring, mild facet arthropathy without
canal stenosis. Moderate neural foraminal narrowing.

T12-L1: Retropulsed bony fragments, mild facet arthropathy and
ligamentum flavum redundancy. Mild canal stenosis. Moderate neural
foraminal narrowing.

MRI LUMBAR SPINE FINDINGS

SEGMENTATION: For the purposes of this report, the last well-formed
intervertebral disc will be described as L5-S1.

ALIGNMENT: Maintenance of the lumbar lordosis. No malalignment.

VERTEBRAE:Old moderate L1 and L2 compression fractures, status post
cement augmentation. Mild old L3 inferior endplate compression
fracture with subacute Schmorl's node. Severe L4-5 and L5-S1 disc
height loss is unchanged, desiccation all lumbar levels. Mild disc
edema at L4-5 toward the RIGHT. Bright T1 bone marrow signal
compatible with osteopenia.

CONUS MEDULLARIS: Conus medullaris terminates at L1-2 and
demonstrates normal morphology and signal characteristics. Cauda
equina is normal.

PARASPINAL AND SOFT TISSUES: Included prevertebral and paraspinal
soft tissues are nonacute.

DISC LEVELS:

L1-2: Retropulsed bony fragments, mild facet arthropathy and
ligamentum flavum redundancy. Minimal canal stenosis. No neural
foraminal narrowing.

L2-3: Small broad-based disc bulge and endplate spurring. Moderate
facet arthropathy and ligamentum flavum redundancy. Mild canal
stenosis. No neural foraminal narrowing.

L3-4: Moderate broad-based disc bulge and central disc protrusion,
moderate disc severe facet arthropathy and ligamentum flavum
redundancy. Moderate canal stenosis. Mild LEFT neural foraminal
narrowing.

L4-5: Similar moderate broad-based disc bulge, LEFT disc protrusion.
Status post LEFT laminectomy. Effaced LEFT lateral recess may affect
the traversing LEFT L5 nerve. No canal stenosis. Moderate RIGHT,
mild LEFT neural foraminal narrowing.

L5-S1: Stable small broad-based disc bulge, mild facet arthropathy
without canal stenosis. Mild to moderate bilateral neural foraminal
narrowing.
IMPRESSION: MRI THORACIC SPINE: Old compression fractures, compatible with
insufficiency fractures. Mild progressed height loss at T10. Status
post T12 cement augmentation, stable height loss.

Mild canal stenosis T10-11. Moderate neural foraminal narrowing
T10-11 through T12-L1.

MRI LUMBAR SPINE: Old L1 through L3 compression fractures, status
post L1 and L2 cement augmentation, stable height loss.

Degenerative lumbar spine resulting in moderate canal stenosis L3-4,
mild at L2-3. Neural foraminal narrowing L3-4 through L5-S1:
Moderate on the RIGHT at L4-5.

## 2018-03-25 ENCOUNTER — Other Ambulatory Visit: Payer: Self-pay | Admitting: Physician Assistant

## 2018-03-25 DIAGNOSIS — K219 Gastro-esophageal reflux disease without esophagitis: Secondary | ICD-10-CM

## 2018-03-25 DIAGNOSIS — K449 Diaphragmatic hernia without obstruction or gangrene: Secondary | ICD-10-CM

## 2018-03-29 ENCOUNTER — Ambulatory Visit
Admission: RE | Admit: 2018-03-29 | Discharge: 2018-03-29 | Disposition: A | Discharge status: Home / Self Care | Payer: Medicare Other | Source: Ambulatory Visit | Attending: Physician Assistant | Admitting: Physician Assistant

## 2018-03-29 DIAGNOSIS — K449 Diaphragmatic hernia without obstruction or gangrene: Secondary | ICD-10-CM

## 2018-03-29 DIAGNOSIS — K219 Gastro-esophageal reflux disease without esophagitis: Secondary | ICD-10-CM

## 2021-02-04 ENCOUNTER — Encounter (HOSPITAL_COMMUNITY): Payer: Self-pay | Admitting: *Deleted

## 2021-02-04 ENCOUNTER — Emergency Department (HOSPITAL_COMMUNITY)
Admission: EM | Admit: 2021-02-04 | Discharge: 2021-02-04 | Disposition: A | Discharge status: Home / Self Care | Payer: Medicare Other | Attending: Physician Assistant | Admitting: Physician Assistant

## 2021-02-04 ENCOUNTER — Other Ambulatory Visit: Payer: Self-pay

## 2021-02-04 ENCOUNTER — Emergency Department (HOSPITAL_COMMUNITY): Payer: Medicare Other

## 2021-02-04 DIAGNOSIS — Z5321 Procedure and treatment not carried out due to patient leaving prior to being seen by health care provider: Secondary | ICD-10-CM | POA: Insufficient documentation

## 2021-02-04 DIAGNOSIS — K5900 Constipation, unspecified: Secondary | ICD-10-CM | POA: Insufficient documentation

## 2021-02-04 DIAGNOSIS — R195 Other fecal abnormalities: Secondary | ICD-10-CM | POA: Diagnosis present

## 2021-02-04 LAB — COMPREHENSIVE METABOLIC PANEL WITH GFR
ALT: 16 U/L (ref 0–44)
AST: 27 U/L (ref 15–41)
Albumin: 4.5 g/dL (ref 3.5–5.0)
Alkaline Phosphatase: 52 U/L (ref 38–126)
Anion gap: 13 (ref 5–15)
BUN: 33 mg/dL — ABNORMAL HIGH (ref 8–23)
CO2: 23 mmol/L (ref 22–32)
Calcium: 9.9 mg/dL (ref 8.9–10.3)
Chloride: 94 mmol/L — ABNORMAL LOW (ref 98–111)
Creatinine, Ser: 0.99 mg/dL (ref 0.44–1.00)
GFR, Estimated: 53 mL/min — ABNORMAL LOW (ref >60)
Glucose, Bld: 148 mg/dL — ABNORMAL HIGH (ref 70–99)
Potassium: 4.6 mmol/L (ref 3.5–5.1)
Sodium: 130 mmol/L — ABNORMAL LOW (ref 135–145)
Total Bilirubin: 0.9 mg/dL (ref 0.3–1.2)
Total Protein: 7.4 g/dL (ref 6.5–8.1)

## 2021-02-04 LAB — CBC WITH DIFFERENTIAL/PLATELET
Abs Immature Granulocytes: 0.12 10*3/uL — ABNORMAL HIGH (ref 0.00–0.07)
Basophils Absolute: 0.1 10*3/uL (ref 0.0–0.1)
Basophils Relative: 0 %
Eosinophils Absolute: 0.1 10*3/uL (ref 0.0–0.5)
Eosinophils Relative: 1 %
HCT: 38.4 % (ref 36.0–46.0)
Hemoglobin: 13 g/dL (ref 12.0–15.0)
Immature Granulocytes: 1 %
Lymphocytes Relative: 14 %
Lymphs Abs: 2.6 10*3/uL (ref 0.7–4.0)
MCH: 31.3 pg (ref 26.0–34.0)
MCHC: 33.9 g/dL (ref 30.0–36.0)
MCV: 92.3 fL (ref 80.0–100.0)
Monocytes Absolute: 1.3 10*3/uL — ABNORMAL HIGH (ref 0.1–1.0)
Monocytes Relative: 7 %
Neutro Abs: 14.4 10*3/uL — ABNORMAL HIGH (ref 1.7–7.7)
Neutrophils Relative %: 77 %
Platelets: 357 10*3/uL (ref 150–400)
RBC: 4.16 MIL/uL (ref 3.87–5.11)
RDW: 12.9 % (ref 11.5–15.5)
WBC: 18.7 10*3/uL — ABNORMAL HIGH (ref 4.0–10.5)
nRBC: 0 % (ref 0.0–0.2)

## 2021-02-04 NOTE — ED Notes (Signed)
The patient's daughter decided to take her mother home. This EMT informed the patient's daughter that she was taking her mother home AMA and that if her condition worsened to seek medical attention.

## 2021-02-04 NOTE — ED Triage Notes (Signed)
The pt is c/o constipation since may  her daughter gave her an enema earlier today and the pt reports that she passed some diarrhea stool but she feels like there is a lump that is still not moving

## 2021-02-04 NOTE — ED Provider Notes (Signed)
Emergency Medicine Provider Triage Evaluation Note  Tara Frederick , a 87 y.o. female  was evaluated in triage.  Pt complains of constipation. History of intermittent constipation. This week became increasingly constipated so her daughter gave her an enema today. She has been having loose stools since and notes that she still has a lot of pressure in her rectum as if "something is stuck". No fevers, CP, SOB, n/v. She does note some mild pressure like lower abdominal discomfort.  Physical Exam  BP 135/64 (BP Location: Right Arm)   Pulse 81   Temp 98.3 F (36.8 C) (Oral)   Resp 17   Ht 5\' 1"  (1.549 m)   Wt 52.2 kg   SpO2 97%   BMI 21.74 kg/m  Gen:   Awake, no distress   Resp:  Normal effort  MSK:   Moves extremities without difficulty  Other:    Medical Decision Making  Medically screening exam initiated at 8:21 PM.  Appropriate orders placed.  Tara Frederick was informed that the remainder of the evaluation will be completed by another provider, this initial triage assessment does not replace that evaluation, and the importance of remaining in the ED until their evaluation is complete.   Tara German, PA-C 02/04/21 2023    2024, MD 02/04/21 [phone]
